# Patient Record
Sex: Female | Born: 1955
Health system: Southern US, Community
[De-identification: ages and names within clinical notes are randomized; demographics above are authoritative.]

## PROBLEM LIST (undated history)

## (undated) DIAGNOSIS — E785 Hyperlipidemia, unspecified: Secondary | ICD-10-CM

## (undated) DIAGNOSIS — I4891 Unspecified atrial fibrillation: Secondary | ICD-10-CM

## (undated) DIAGNOSIS — G4730 Sleep apnea, unspecified: Secondary | ICD-10-CM

## (undated) DIAGNOSIS — I10 Essential (primary) hypertension: Secondary | ICD-10-CM

## (undated) DIAGNOSIS — T4145XA Adverse effect of unspecified anesthetic, initial encounter: Secondary | ICD-10-CM

## (undated) DIAGNOSIS — G43909 Migraine, unspecified, not intractable, without status migrainosus: Secondary | ICD-10-CM

## (undated) DIAGNOSIS — K5792 Diverticulitis of intestine, part unspecified, without perforation or abscess without bleeding: Secondary | ICD-10-CM

## (undated) DIAGNOSIS — T8859XA Other complications of anesthesia, initial encounter: Secondary | ICD-10-CM

## (undated) DIAGNOSIS — T884XXA Failed or difficult intubation, initial encounter: Secondary | ICD-10-CM

## (undated) HISTORY — PX: GALLBLADDER SURGERY: SHX652

## (undated) HISTORY — PX: TONSILLECTOMY: SUR1361

## (undated) HISTORY — PX: ABDOMINAL HYSTERECTOMY: SHX81

## (undated) HISTORY — PX: OTHER SURGICAL HISTORY: SHX169

## (undated) HISTORY — PX: NASAL SINUS SURGERY: SHX719

## (undated) HISTORY — PX: KNEE ARTHROSCOPY: SUR90

## (undated) HISTORY — DX: Sleep apnea, unspecified: G47.30

## (undated) HISTORY — PX: SPLENIC ARTERY EMBOLIZATION: SHX2430

---

## 1997-12-29 ENCOUNTER — Emergency Department (HOSPITAL_COMMUNITY): Admission: EM | Admit: 1997-12-29 | Discharge: 1997-12-29 | Payer: Self-pay | Admitting: Emergency Medicine

## 1998-06-18 ENCOUNTER — Ambulatory Visit (HOSPITAL_BASED_OUTPATIENT_CLINIC_OR_DEPARTMENT_OTHER): Admission: RE | Admit: 1998-06-18 | Discharge: 1998-06-18 | Payer: Self-pay | Admitting: Otolaryngology

## 1998-09-04 ENCOUNTER — Ambulatory Visit (HOSPITAL_COMMUNITY): Admission: RE | Admit: 1998-09-04 | Discharge: 1998-09-04 | Payer: Self-pay | Admitting: Obstetrics and Gynecology

## 1998-09-04 ENCOUNTER — Encounter: Payer: Self-pay | Admitting: Obstetrics and Gynecology

## 1998-10-16 ENCOUNTER — Ambulatory Visit (HOSPITAL_COMMUNITY): Admission: RE | Admit: 1998-10-16 | Discharge: 1998-10-16 | Payer: Self-pay | Admitting: Otolaryngology

## 1998-10-17 ENCOUNTER — Encounter: Payer: Self-pay | Admitting: Otolaryngology

## 1998-10-17 ENCOUNTER — Ambulatory Visit (HOSPITAL_COMMUNITY): Admission: RE | Admit: 1998-10-17 | Discharge: 1998-10-17 | Payer: Self-pay | Admitting: Otolaryngology

## 1999-02-03 ENCOUNTER — Emergency Department (HOSPITAL_COMMUNITY): Admission: EM | Admit: 1999-02-03 | Discharge: 1999-02-03 | Payer: Self-pay | Admitting: *Deleted

## 1999-05-19 ENCOUNTER — Encounter: Admission: RE | Admit: 1999-05-19 | Discharge: 1999-08-17 | Payer: Self-pay | Admitting: Internal Medicine

## 1999-05-21 ENCOUNTER — Ambulatory Visit (HOSPITAL_COMMUNITY): Admission: RE | Admit: 1999-05-21 | Discharge: 1999-05-21 | Payer: Self-pay | Admitting: Otolaryngology

## 1999-05-21 ENCOUNTER — Encounter: Payer: Self-pay | Admitting: Otolaryngology

## 1999-05-21 ENCOUNTER — Encounter (INDEPENDENT_AMBULATORY_CARE_PROVIDER_SITE_OTHER): Payer: Self-pay | Admitting: Specialist

## 1999-06-11 ENCOUNTER — Ambulatory Visit (HOSPITAL_COMMUNITY): Admission: RE | Admit: 1999-06-11 | Discharge: 1999-06-11 | Payer: Self-pay | Admitting: Gastroenterology

## 1999-08-31 ENCOUNTER — Ambulatory Visit (HOSPITAL_COMMUNITY): Admission: RE | Admit: 1999-08-31 | Discharge: 1999-08-31 | Payer: Self-pay | Admitting: General Surgery

## 1999-08-31 ENCOUNTER — Encounter: Payer: Self-pay | Admitting: General Surgery

## 2000-03-03 ENCOUNTER — Encounter: Admission: RE | Admit: 2000-03-03 | Discharge: 2000-06-01 | Payer: Self-pay | Admitting: *Deleted

## 2000-03-24 ENCOUNTER — Encounter: Payer: Self-pay | Admitting: Otolaryngology

## 2000-03-24 ENCOUNTER — Ambulatory Visit (HOSPITAL_COMMUNITY): Admission: RE | Admit: 2000-03-24 | Discharge: 2000-03-24 | Payer: Self-pay | Admitting: Otolaryngology

## 2000-05-25 ENCOUNTER — Encounter (INDEPENDENT_AMBULATORY_CARE_PROVIDER_SITE_OTHER): Payer: Self-pay | Admitting: *Deleted

## 2000-05-25 ENCOUNTER — Ambulatory Visit (HOSPITAL_BASED_OUTPATIENT_CLINIC_OR_DEPARTMENT_OTHER): Admission: RE | Admit: 2000-05-25 | Discharge: 2000-05-25 | Payer: Self-pay | Admitting: Otolaryngology

## 2000-08-10 ENCOUNTER — Encounter: Admission: RE | Admit: 2000-08-10 | Discharge: 2000-08-10 | Payer: Self-pay | Admitting: Obstetrics and Gynecology

## 2000-08-10 ENCOUNTER — Encounter: Payer: Self-pay | Admitting: Obstetrics and Gynecology

## 2001-01-20 ENCOUNTER — Emergency Department (HOSPITAL_COMMUNITY): Admission: EM | Admit: 2001-01-20 | Discharge: 2001-01-20 | Payer: Self-pay | Admitting: Emergency Medicine

## 2001-02-18 ENCOUNTER — Emergency Department: Admission: EM | Admit: 2001-02-18 | Discharge: 2001-02-18 | Payer: Self-pay | Admitting: Emergency Medicine

## 2001-06-16 ENCOUNTER — Emergency Department (HOSPITAL_COMMUNITY): Admission: EM | Admit: 2001-06-16 | Discharge: 2001-06-17 | Payer: Self-pay | Admitting: Emergency Medicine

## 2001-06-17 ENCOUNTER — Encounter: Payer: Self-pay | Admitting: Emergency Medicine

## 2001-11-19 ENCOUNTER — Ambulatory Visit (HOSPITAL_COMMUNITY): Admission: RE | Admit: 2001-11-19 | Discharge: 2001-11-19 | Payer: Self-pay | Admitting: Otolaryngology

## 2001-11-19 ENCOUNTER — Encounter: Payer: Self-pay | Admitting: Otolaryngology

## 2003-02-08 ENCOUNTER — Encounter: Payer: Self-pay | Admitting: Emergency Medicine

## 2003-02-08 ENCOUNTER — Emergency Department (HOSPITAL_COMMUNITY): Admission: EM | Admit: 2003-02-08 | Discharge: 2003-02-08 | Payer: Self-pay | Admitting: Emergency Medicine

## 2003-02-12 ENCOUNTER — Encounter: Payer: Self-pay | Admitting: Orthopedic Surgery

## 2003-02-12 ENCOUNTER — Ambulatory Visit (HOSPITAL_COMMUNITY): Admission: RE | Admit: 2003-02-12 | Discharge: 2003-02-12 | Payer: Self-pay | Admitting: Orthopedic Surgery

## 2003-02-18 ENCOUNTER — Encounter: Payer: Self-pay | Admitting: Orthopedic Surgery

## 2003-02-18 ENCOUNTER — Ambulatory Visit (HOSPITAL_COMMUNITY): Admission: RE | Admit: 2003-02-18 | Discharge: 2003-02-18 | Payer: Self-pay | Admitting: Orthopedic Surgery

## 2003-02-20 ENCOUNTER — Encounter: Payer: Self-pay | Admitting: Emergency Medicine

## 2003-02-20 ENCOUNTER — Emergency Department (HOSPITAL_COMMUNITY): Admission: EM | Admit: 2003-02-20 | Discharge: 2003-02-20 | Payer: Self-pay | Admitting: Emergency Medicine

## 2003-06-19 ENCOUNTER — Other Ambulatory Visit: Admission: RE | Admit: 2003-06-19 | Discharge: 2003-06-19 | Payer: Self-pay | Admitting: Obstetrics and Gynecology

## 2003-06-20 ENCOUNTER — Ambulatory Visit (HOSPITAL_COMMUNITY): Admission: RE | Admit: 2003-06-20 | Discharge: 2003-06-20 | Payer: Self-pay | Admitting: Obstetrics and Gynecology

## 2003-11-04 ENCOUNTER — Ambulatory Visit (HOSPITAL_COMMUNITY): Admission: RE | Admit: 2003-11-04 | Discharge: 2003-11-04 | Payer: Self-pay | Admitting: Otolaryngology

## 2003-12-02 ENCOUNTER — Ambulatory Visit (HOSPITAL_COMMUNITY): Admission: RE | Admit: 2003-12-02 | Discharge: 2003-12-02 | Payer: Self-pay | Admitting: Orthopedic Surgery

## 2003-12-02 ENCOUNTER — Ambulatory Visit (HOSPITAL_COMMUNITY): Admission: RE | Admit: 2003-12-02 | Discharge: 2003-12-02 | Payer: Self-pay | Admitting: Internal Medicine

## 2003-12-03 ENCOUNTER — Emergency Department (HOSPITAL_COMMUNITY): Admission: EM | Admit: 2003-12-03 | Discharge: 2003-12-03 | Payer: Self-pay | Admitting: Emergency Medicine

## 2004-01-05 ENCOUNTER — Ambulatory Visit (HOSPITAL_COMMUNITY): Admission: RE | Admit: 2004-01-05 | Discharge: 2004-01-05 | Payer: Self-pay | Admitting: Internal Medicine

## 2004-01-07 ENCOUNTER — Inpatient Hospital Stay (HOSPITAL_COMMUNITY): Admission: AD | Admit: 2004-01-07 | Discharge: 2004-01-09 | Payer: Self-pay | Admitting: Otolaryngology

## 2004-01-07 ENCOUNTER — Ambulatory Visit (HOSPITAL_COMMUNITY): Admission: RE | Admit: 2004-01-07 | Discharge: 2004-01-07 | Payer: Self-pay | Admitting: Otolaryngology

## 2004-01-08 ENCOUNTER — Encounter (INDEPENDENT_AMBULATORY_CARE_PROVIDER_SITE_OTHER): Payer: Self-pay | Admitting: *Deleted

## 2004-01-13 ENCOUNTER — Inpatient Hospital Stay (HOSPITAL_COMMUNITY): Admission: AD | Admit: 2004-01-13 | Discharge: 2004-01-15 | Payer: Self-pay | Admitting: Otolaryngology

## 2004-01-19 ENCOUNTER — Ambulatory Visit (HOSPITAL_COMMUNITY): Admission: RE | Admit: 2004-01-19 | Discharge: 2004-01-19 | Payer: Self-pay | Admitting: Otolaryngology

## 2004-04-08 ENCOUNTER — Ambulatory Visit (HOSPITAL_COMMUNITY): Admission: RE | Admit: 2004-04-08 | Discharge: 2004-04-08 | Payer: Self-pay | Admitting: Otolaryngology

## 2004-07-02 ENCOUNTER — Ambulatory Visit (HOSPITAL_COMMUNITY): Admission: RE | Admit: 2004-07-02 | Discharge: 2004-07-02 | Payer: Self-pay | Admitting: Allergy and Immunology

## 2004-07-15 ENCOUNTER — Encounter: Admission: RE | Admit: 2004-07-15 | Discharge: 2004-07-15 | Payer: Self-pay | Admitting: Internal Medicine

## 2004-10-06 ENCOUNTER — Ambulatory Visit (HOSPITAL_COMMUNITY): Admission: RE | Admit: 2004-10-06 | Discharge: 2004-10-06 | Payer: Self-pay | Admitting: Orthopedic Surgery

## 2004-10-11 ENCOUNTER — Ambulatory Visit (HOSPITAL_COMMUNITY): Admission: RE | Admit: 2004-10-11 | Discharge: 2004-10-11 | Payer: Self-pay | Admitting: Obstetrics and Gynecology

## 2004-10-19 ENCOUNTER — Ambulatory Visit (HOSPITAL_COMMUNITY): Admission: RE | Admit: 2004-10-19 | Discharge: 2004-10-19 | Payer: Self-pay | Admitting: Orthopedic Surgery

## 2004-11-02 ENCOUNTER — Ambulatory Visit (HOSPITAL_COMMUNITY): Admission: RE | Admit: 2004-11-02 | Discharge: 2004-11-02 | Payer: Self-pay | Admitting: Orthopedic Surgery

## 2004-11-02 ENCOUNTER — Encounter (HOSPITAL_COMMUNITY): Admission: RE | Admit: 2004-11-02 | Discharge: 2004-12-02 | Payer: Self-pay | Admitting: Orthopedic Surgery

## 2004-12-07 ENCOUNTER — Encounter (HOSPITAL_COMMUNITY): Admission: RE | Admit: 2004-12-07 | Discharge: 2005-01-06 | Payer: Self-pay | Admitting: Orthopedic Surgery

## 2005-01-18 ENCOUNTER — Encounter (HOSPITAL_COMMUNITY): Admission: RE | Admit: 2005-01-18 | Discharge: 2005-02-17 | Payer: Self-pay | Admitting: Orthopedic Surgery

## 2005-04-07 ENCOUNTER — Inpatient Hospital Stay (HOSPITAL_COMMUNITY): Admission: AD | Admit: 2005-04-07 | Discharge: 2005-04-12 | Payer: Self-pay | Admitting: Internal Medicine

## 2005-07-07 ENCOUNTER — Ambulatory Visit (HOSPITAL_COMMUNITY): Admission: RE | Admit: 2005-07-07 | Discharge: 2005-07-08 | Payer: Self-pay | Admitting: Otolaryngology

## 2005-07-07 ENCOUNTER — Encounter (INDEPENDENT_AMBULATORY_CARE_PROVIDER_SITE_OTHER): Payer: Self-pay | Admitting: Specialist

## 2005-10-12 ENCOUNTER — Encounter: Payer: Self-pay | Admitting: *Deleted

## 2005-10-13 ENCOUNTER — Ambulatory Visit (HOSPITAL_COMMUNITY): Admission: RE | Admit: 2005-10-13 | Discharge: 2005-10-13 | Payer: Self-pay | Admitting: Gastroenterology

## 2005-11-26 ENCOUNTER — Emergency Department (HOSPITAL_COMMUNITY): Admission: EM | Admit: 2005-11-26 | Discharge: 2005-11-26 | Payer: Self-pay | Admitting: Emergency Medicine

## 2006-01-27 ENCOUNTER — Ambulatory Visit (HOSPITAL_COMMUNITY): Admission: RE | Admit: 2006-01-27 | Discharge: 2006-01-27 | Payer: Self-pay | Admitting: Internal Medicine

## 2006-02-08 ENCOUNTER — Inpatient Hospital Stay (HOSPITAL_COMMUNITY): Admission: EM | Admit: 2006-02-08 | Discharge: 2006-02-11 | Payer: Self-pay | Admitting: Emergency Medicine

## 2006-05-22 ENCOUNTER — Ambulatory Visit (HOSPITAL_COMMUNITY): Admission: RE | Admit: 2006-05-22 | Discharge: 2006-05-22 | Payer: Self-pay | Admitting: Otolaryngology

## 2006-08-10 ENCOUNTER — Ambulatory Visit (HOSPITAL_COMMUNITY): Admission: RE | Admit: 2006-08-10 | Discharge: 2006-08-10 | Payer: Self-pay | Admitting: Obstetrics and Gynecology

## 2007-11-30 ENCOUNTER — Ambulatory Visit (HOSPITAL_COMMUNITY): Admission: RE | Admit: 2007-11-30 | Discharge: 2007-11-30 | Payer: Self-pay | Admitting: Obstetrics and Gynecology

## 2008-05-29 ENCOUNTER — Ambulatory Visit (HOSPITAL_COMMUNITY): Admission: RE | Admit: 2008-05-29 | Discharge: 2008-05-29 | Payer: Self-pay | Admitting: Otolaryngology

## 2008-06-17 ENCOUNTER — Encounter (INDEPENDENT_AMBULATORY_CARE_PROVIDER_SITE_OTHER): Payer: Self-pay | Admitting: Interventional Radiology

## 2008-06-17 ENCOUNTER — Ambulatory Visit (HOSPITAL_COMMUNITY): Admission: RE | Admit: 2008-06-17 | Discharge: 2008-06-17 | Payer: Self-pay | Admitting: Otolaryngology

## 2008-08-23 ENCOUNTER — Emergency Department (HOSPITAL_COMMUNITY): Admission: EM | Admit: 2008-08-23 | Discharge: 2008-08-24 | Payer: Self-pay | Admitting: Emergency Medicine

## 2008-08-25 ENCOUNTER — Inpatient Hospital Stay (HOSPITAL_COMMUNITY): Admission: AD | Admit: 2008-08-25 | Discharge: 2008-08-29 | Payer: Self-pay | Admitting: Internal Medicine

## 2008-08-28 ENCOUNTER — Encounter (INDEPENDENT_AMBULATORY_CARE_PROVIDER_SITE_OTHER): Payer: Self-pay | Admitting: Internal Medicine

## 2008-09-01 ENCOUNTER — Inpatient Hospital Stay (HOSPITAL_COMMUNITY): Admission: EM | Admit: 2008-09-01 | Discharge: 2008-09-04 | Payer: Self-pay | Admitting: Emergency Medicine

## 2008-11-21 ENCOUNTER — Ambulatory Visit (HOSPITAL_COMMUNITY): Admission: RE | Admit: 2008-11-21 | Discharge: 2008-11-21 | Payer: Self-pay | Admitting: Otolaryngology

## 2008-12-30 ENCOUNTER — Ambulatory Visit (HOSPITAL_COMMUNITY): Admission: RE | Admit: 2008-12-30 | Discharge: 2008-12-30 | Payer: Self-pay | Admitting: Internal Medicine

## 2009-02-11 ENCOUNTER — Encounter (INDEPENDENT_AMBULATORY_CARE_PROVIDER_SITE_OTHER): Payer: Self-pay | Admitting: Interventional Radiology

## 2009-02-11 ENCOUNTER — Ambulatory Visit (HOSPITAL_COMMUNITY): Admission: RE | Admit: 2009-02-11 | Discharge: 2009-02-11 | Payer: Self-pay | Admitting: Otolaryngology

## 2009-02-18 ENCOUNTER — Ambulatory Visit (HOSPITAL_COMMUNITY): Admission: RE | Admit: 2009-02-18 | Discharge: 2009-02-18 | Payer: Self-pay | Admitting: Obstetrics and Gynecology

## 2009-07-19 ENCOUNTER — Observation Stay (HOSPITAL_COMMUNITY): Admission: EM | Admit: 2009-07-19 | Discharge: 2009-07-20 | Payer: Self-pay | Admitting: Emergency Medicine

## 2009-07-19 ENCOUNTER — Ambulatory Visit: Payer: Self-pay | Admitting: Internal Medicine

## 2009-07-20 ENCOUNTER — Telehealth (INDEPENDENT_AMBULATORY_CARE_PROVIDER_SITE_OTHER): Payer: Self-pay

## 2009-07-21 ENCOUNTER — Encounter (HOSPITAL_COMMUNITY): Admission: RE | Admit: 2009-07-21 | Discharge: 2009-09-16 | Payer: Self-pay | Admitting: Cardiology

## 2009-07-21 ENCOUNTER — Ambulatory Visit: Payer: Self-pay

## 2009-07-21 ENCOUNTER — Ambulatory Visit: Payer: Self-pay | Admitting: Cardiology

## 2009-07-29 ENCOUNTER — Encounter: Payer: Self-pay | Admitting: Internal Medicine

## 2009-07-30 DIAGNOSIS — E041 Nontoxic single thyroid nodule: Secondary | ICD-10-CM | POA: Insufficient documentation

## 2009-07-30 DIAGNOSIS — E119 Type 2 diabetes mellitus without complications: Secondary | ICD-10-CM | POA: Insufficient documentation

## 2009-07-30 DIAGNOSIS — K589 Irritable bowel syndrome without diarrhea: Secondary | ICD-10-CM | POA: Insufficient documentation

## 2009-07-30 DIAGNOSIS — E785 Hyperlipidemia, unspecified: Secondary | ICD-10-CM | POA: Insufficient documentation

## 2009-07-30 DIAGNOSIS — K219 Gastro-esophageal reflux disease without esophagitis: Secondary | ICD-10-CM | POA: Insufficient documentation

## 2009-07-30 DIAGNOSIS — G4733 Obstructive sleep apnea (adult) (pediatric): Secondary | ICD-10-CM | POA: Insufficient documentation

## 2009-07-30 DIAGNOSIS — I10 Essential (primary) hypertension: Secondary | ICD-10-CM | POA: Insufficient documentation

## 2009-08-04 ENCOUNTER — Ambulatory Visit: Payer: Self-pay | Admitting: Cardiology

## 2009-08-04 DIAGNOSIS — R079 Chest pain, unspecified: Secondary | ICD-10-CM | POA: Insufficient documentation

## 2010-04-19 ENCOUNTER — Ambulatory Visit (HOSPITAL_COMMUNITY)
Admission: RE | Admit: 2010-04-19 | Discharge: 2010-04-19 | Payer: Self-pay | Source: Home / Self Care | Admitting: Obstetrics and Gynecology

## 2010-07-03 ENCOUNTER — Encounter: Payer: Self-pay | Admitting: Obstetrics and Gynecology

## 2010-07-04 ENCOUNTER — Encounter: Payer: Self-pay | Admitting: Orthopedic Surgery

## 2010-07-15 NOTE — Assessment & Plan Note (Signed)
Summary: Cardiology Nuclear Study  Nuclear Med Background Indications for Stress Test: Evaluation for Ischemia, Post Hospital  Indications Comments: 07/19/09 [facility]:chest pain, left arm pain.  MI r/o.  History: Asthma, Echo, GXT  History Comments: '08 GXT:(-); 3/10 Echo: EF=65-70%; h/o OSA  Symptoms: Chest Pressure, Fatigue, Fatigue with Exertion, Palpitations  Symptoms Comments: CP>(L)neck and arm. Last episode of ZH:YQMVHQION.   Nuclear Pre-Procedure Cardiac Risk Factors: Hypertension, Lipids, NIDDM Caffeine/Decaff Intake: None NPO After: 7:30 AM Lungs: Clear IV 0.9% NS with Angio Cath: 22g     IV Site: (R) Wrist IV Started by: Irean Hong RN Chest Size (in) 38     Cup Size D     Height (in): 68.5 Weight (lb): 201 BMI: 30.23  Nuclear Med Study 1 or 2 day study:  1 day     Stress Test Type:  Stress Reading MD:  Willa Rough, MD     Referring MD:  Valera Castle, MD Resting Radionuclide:  Technetium 39m Tetrofosmin     Resting Radionuclide Dose:  11.0 mCi  Stress Radionuclide:  Technetium 6m Tetrofosmin     Stress Radionuclide Dose:  32.0 mCi   Stress Protocol Exercise Time (min):  7:00 min     Max HR:  151 bpm     Predicted Max HR:  165 bpm  Max Systolic BP: 130 mm Hg     Percent Max HR:  91.52 %     METS: 8.5 Rate Pressure Product:  62952    Stress Test Technologist:  Rea College CMA-N     Nuclear Technologist:  Harlow Asa CNMT  Rest Procedure  Myocardial perfusion imaging was performed at rest 45 minutes following the intravenous administration of Myoview Technetium 98m Tetrofosmin.  Stress Procedure  The patient exercised for seven minutes.  The patient stopped due to fatigue and denied any chest pain.  There were no significant ST-T wave changes. Myoview was injected at peak exercise and myocardial perfusion imaging was performed after a brief delay.  QPS Raw Data Images:  Normal; no motion artifact; normal heart/lung ratio. Stress Images:  There is normal  uptake in all areas. Rest Images:  Normal homogeneous uptake in all areas of the myocardium. Subtraction (SDS):  No evidence of ischemia. Transient Ischemic Dilatation:  .95  (Normal <1.22)  Lung/Heart Ratio:  .32  (Normal <0.45)  Quantitative Gated Spect Images QGS EDV:  67 ml QGS ESV:  16 ml QGS EF:  76 % QGS cine images:  Normal motion  Findings Normal nuclear study      Overall Impression  Exercise Capacity: Fair exercise capacity. BP Response: Normal blood pressure response. Clinical Symptoms: No chest pain ECG Impression: No significant ST segment change suggestive of ischemia. Overall Impression: Normal stress nuclear study.  Appended Document: Cardiology Nuclear Study ok.

## 2010-07-15 NOTE — Progress Notes (Signed)
Summary: Nuc. Pre-Procedure  Phone Note Outgoing Call Call back at [facility] Phone [phone]   Call placed by: Irean Hong, RN,  July 20, 2009 3:10 PM Summary of Call: Left message with information on Myoview Information Sheet (see scanned document for details).      Nuclear Med Background Indications for Stress Test: Evaluation for Ischemia, Post Hospital  Indications Comments: 07/19/09 [facility]: CP,Left arm pain.  History: Echo, GXT  History Comments: '08 GXT:(-). 3/10 Echo: EF=65-70%.  Symptoms: Chest Pressure  Symptoms Comments: Radiates (L) Neck and arm.   Nuclear Pre-Procedure Cardiac Risk Factors: Hypertension, Lipids, NIDDM

## 2010-07-15 NOTE — Assessment & Plan Note (Signed)
Summary: eph/ gd   Visit Type:  EPH Primary Provider:  Dr. Felipa Eth  CC:  no cardiac complaints today...here for Nuc results.  History of Present Illness: Mrs [name] returns today for further evaluation and management of chest discomfort.  She was admitted overnight and ruled out by cardiac markers.  With her history of several cardiac risk factors, we performed a stress Myoview as an outpatient. Her ejection fraction 76% with good exercise tolerance. There were no Alberto Pina motion abnormalities and no ischemia noted.  She's had no further discomfort.  Current Medications (verified): 1)  Amlodipine Besylate 5 Mg Tabs (Amlodipine Besylate) .Marland Kitchen.. 1 Tab Once Daily 2)  Effexor Xr 150 Mg Xr24h-Cap (Venlafaxine Hcl) .Marland Kitchen.. 1 Cap Once Daily 3)  Furosemide 40 Mg Tabs (Furosemide) .Marland Kitchen.. 1 Tab Once Daily 4)  Pravastatin Sodium 20 Mg Tabs (Pravastatin Sodium) .Marland Kitchen.. 1 Tab At Bedtime 5)  Quinapril Hcl 40 Mg Tabs (Quinapril Hcl) .Marland Kitchen.. 1 Tab Once Daily 6)  Tylenol Extra Strength 500 Mg Tabs (Acetaminophen) .Marland Kitchen.. 1 Tab Every 8 Hours As Needed  Allergies: 1)  ! Ibuprofen 2)  ! Aspirin 3)  ! Penicillin 4)  ! Sulfa 5)  ! Codeine 6)  ! Demerol 7)  ! Cephalosporins 8)  ! * Ct Dye  Past History:  Past Medical History: Last updated: 07/30/2009 HYPERTENSION (ICD-401.9) HYPERLIPIDEMIA (ICD-272.4) GERD (ICD-530.81) THYROID NODULE, LEFT (ICD-241.0) DIABETES MELLITUS, TYPE II (ICD-250.00) IRRITABLE BOWEL SYNDROME (ICD-564.1) SLEEP APNEA, OBSTRUCTIVE (ICD-327.23) BASAL CELL CARCINOMA, HX OF/ FOREHEAD (ICD-V10.83)  Past Surgical History: Last updated: 07/30/2009  1. Laparoscopic cholecystectomy.   2. Tonsillectomy.   3. Splenectomy.   4. Total abdominal hysterectomy  Family History: Last updated: 07/30/2009  Negative for premature coronary artery disease.      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  She denies any tobacco,  alcohol or drug use.      Review of Systems       negative other than history of present illness  Vital Signs:  Patient profile:   55 year old female Height:      68.5 inches Weight:      206 pounds BMI:     30.98 Pulse rate:   80 / minute Pulse rhythm:   regular BP sitting:   128 / 80  (left arm) Cuff size:   large  Vitals Entered By: Danielle Rankin, CMA (August 04, 2009 12:11 PM)  Physical Exam  General:  Well developed, well nourished, in no acute distress. Head:  normocephalic and atraumatic Eyes:  PERRLA/EOM intact; conjunctiva and lids normal. Neck:  Neck supple, no JVD. No masses, thyromegaly or abnormal cervical nodes. Chest Marirose Deveney:  no deformities or breast masses noted Lungs:  Clear bilaterally to auscultation and percussion. Heart:  Non-displaced PMI, chest non-tender; regular rate and rhythm, S1, S2 without murmurs, rubs or gallops. Carotid upstroke normal, no bruit. Normal abdominal aortic size, no bruits. Femorals normal pulses, no bruits. Pedals normal pulses. No edema, no varicosities. Msk:  Back normal, normal gait. Muscle strength and tone normal. Pulses:  pulses normal in all 4 extremities Extremities:  No clubbing or cyanosis. Neurologic:  Alert and oriented x 3. Skin:  Intact without lesions or rashes. Psych:  Normal affect.   Problems:  Medical Problems Added: 1)  Dx of Chest Pain-unspecified  (ICD-786.50)  Impression & Recommendations:  Problem # 1:  CHEST PAIN-UNSPECIFIED (ICD-786.50) Assessment Improved She is  having no further discomfort.  Stress Myoview reviewed. At this point in time, no further cardiac evaluation indicated. Symptoms of angina and acute coronary syndrome reviewed once again. I'll see her back p.r.n. Her updated medication list for this problem includes:    Amlodipine Besylate 5 Mg Tabs (Amlodipine besylate) .Marland Kitchen... 1 tab once daily    Quinapril Hcl 40 Mg Tabs (Quinapril hcl) .Marland Kitchen... 1 tab once daily  Patient Instructions: 1)   Your physician recommends that you schedule a follow-up appointment in: AS NEEDED 2)  Your physician recommends that you continue on your current medications as directed. Please refer to the Current Medication list given to you today.

## 2010-08-26 ENCOUNTER — Other Ambulatory Visit: Payer: Self-pay | Admitting: Internal Medicine

## 2010-08-26 DIAGNOSIS — R51 Headache: Secondary | ICD-10-CM

## 2010-08-27 ENCOUNTER — Ambulatory Visit
Admission: RE | Admit: 2010-08-27 | Discharge: 2010-08-27 | Disposition: A | Discharge status: Home / Self Care | Payer: 59 | Source: Ambulatory Visit | Attending: Internal Medicine | Admitting: Internal Medicine

## 2010-08-27 DIAGNOSIS — R51 Headache: Secondary | ICD-10-CM

## 2010-09-01 LAB — CK TOTAL AND CKMB (NOT AT ARMC)
CK, MB: 1.1 ng/mL (ref 0.3–4.0)
Relative Index: INVALID (ref 0.0–2.5)

## 2010-09-01 LAB — DIFFERENTIAL
Basophils Absolute: 0.1 10*3/uL (ref 0.0–0.1)
Eosinophils Absolute: 0.2 10*3/uL (ref 0.0–0.7)
Lymphs Abs: 6.8 10*3/uL — ABNORMAL HIGH (ref 0.7–4.0)
Monocytes Absolute: 1.3 10*3/uL — ABNORMAL HIGH (ref 0.1–1.0)

## 2010-09-01 LAB — POCT CARDIAC MARKERS: Troponin i, poc: <0.05 ng/mL (ref 0.00–0.09)

## 2010-09-01 LAB — CBC
MCHC: 33.9 g/dL (ref 30.0–36.0)
MCV: 96 fL (ref 78.0–100.0)
Platelets: 313 10*3/uL (ref 150–400)
RDW: 13 % (ref 11.5–15.5)

## 2010-09-01 LAB — LIPID PANEL
LDL Cholesterol: 93 mg/dL (ref 0–99)
VLDL: 34 mg/dL (ref 0–40)

## 2010-09-01 LAB — CARDIAC PANEL(CRET KIN+CKTOT+MB+TROPI)
CK, MB: 1 ng/mL (ref 0.3–4.0)
Troponin I: 0.01 ng/mL (ref 0.00–0.06)

## 2010-09-01 LAB — BASIC METABOLIC PANEL
Creatinine, Ser: 0.93 mg/dL (ref 0.4–1.2)
GFR calc Af Amer: >60 mL/min (ref >60)
GFR calc non Af Amer: >60 mL/min (ref >60)
Sodium: 141 mEq/L (ref 135–145)

## 2010-09-01 LAB — PATHOLOGIST SMEAR REVIEW

## 2010-09-01 LAB — HEMOGLOBIN A1C: Hgb A1c MFr Bld: 6 % (ref 4.6–6.1)

## 2010-09-01 LAB — TROPONIN I: Troponin I: 0.01 ng/mL (ref 0.00–0.06)

## 2010-09-01 LAB — GLUCOSE, CAPILLARY: Glucose-Capillary: 91 mg/dL (ref 70–99)

## 2010-09-23 LAB — GLUCOSE, CAPILLARY
Glucose-Capillary: 106 mg/dL — ABNORMAL HIGH (ref 70–99)
Glucose-Capillary: 109 mg/dL — ABNORMAL HIGH (ref 70–99)
Glucose-Capillary: 118 mg/dL — ABNORMAL HIGH (ref 70–99)
Glucose-Capillary: 122 mg/dL — ABNORMAL HIGH (ref 70–99)
Glucose-Capillary: 125 mg/dL — ABNORMAL HIGH (ref 70–99)
Glucose-Capillary: 131 mg/dL — ABNORMAL HIGH (ref 70–99)
Glucose-Capillary: 132 mg/dL — ABNORMAL HIGH (ref 70–99)
Glucose-Capillary: 132 mg/dL — ABNORMAL HIGH (ref 70–99)
Glucose-Capillary: 135 mg/dL — ABNORMAL HIGH (ref 70–99)
Glucose-Capillary: 148 mg/dL — ABNORMAL HIGH (ref 70–99)
Glucose-Capillary: 168 mg/dL — ABNORMAL HIGH (ref 70–99)
Glucose-Capillary: 169 mg/dL — ABNORMAL HIGH (ref 70–99)
Glucose-Capillary: 170 mg/dL — ABNORMAL HIGH (ref 70–99)
Glucose-Capillary: 183 mg/dL — ABNORMAL HIGH (ref 70–99)
Glucose-Capillary: 243 mg/dL — ABNORMAL HIGH (ref 70–99)

## 2010-09-23 LAB — COMPREHENSIVE METABOLIC PANEL
ALT: 42 U/L — ABNORMAL HIGH (ref 0–35)
ALT: 74 U/L — ABNORMAL HIGH (ref 0–35)
ALT: 83 U/L — ABNORMAL HIGH (ref 0–35)
ALT: 65 U/L — ABNORMAL HIGH (ref 0–35)
AST: 27 U/L (ref 0–37)
AST: 29 U/L (ref 0–37)
AST: 31 U/L (ref 0–37)
AST: 40 U/L — ABNORMAL HIGH (ref 0–37)
AST: 45 U/L — ABNORMAL HIGH (ref 0–37)
AST: 52 U/L — ABNORMAL HIGH (ref 0–37)
Albumin: 2.8 g/dL — ABNORMAL LOW (ref 3.5–5.2)
Albumin: 2.9 g/dL — ABNORMAL LOW (ref 3.5–5.2)
Albumin: 2.9 g/dL — ABNORMAL LOW (ref 3.5–5.2)
Albumin: 3.2 g/dL — ABNORMAL LOW (ref 3.5–5.2)
Albumin: 3 g/dL — ABNORMAL LOW (ref 3.5–5.2)
Albumin: 3.5 g/dL (ref 3.5–5.2)
Alkaline Phosphatase: 122 U/L — ABNORMAL HIGH (ref 39–117)
Alkaline Phosphatase: 131 U/L — ABNORMAL HIGH (ref 39–117)
Alkaline Phosphatase: 139 U/L — ABNORMAL HIGH (ref 39–117)
Alkaline Phosphatase: 144 U/L — ABNORMAL HIGH (ref 39–117)
Alkaline Phosphatase: 144 U/L — ABNORMAL HIGH (ref 39–117)
Alkaline Phosphatase: 121 U/L — ABNORMAL HIGH (ref 39–117)
BUN: 9 mg/dL (ref 6–23)
BUN: 13 mg/dL (ref 6–23)
CO2: 28 mEq/L (ref 19–32)
CO2: 28 mEq/L (ref 19–32)
CO2: 26 mEq/L (ref 19–32)
Calcium: 8.5 mg/dL (ref 8.4–10.5)
Calcium: 8.7 mg/dL (ref 8.4–10.5)
Calcium: 8.8 mg/dL (ref 8.4–10.5)
Calcium: 9.2 mg/dL (ref 8.4–10.5)
Calcium: 8.8 mg/dL (ref 8.4–10.5)
Chloride: 104 mEq/L (ref 96–112)
Chloride: 104 mEq/L (ref 96–112)
Chloride: 104 mEq/L (ref 96–112)
Chloride: 107 mEq/L (ref 96–112)
Creatinine, Ser: 0.9 mg/dL (ref 0.4–1.2)
Creatinine, Ser: 0.93 mg/dL (ref 0.4–1.2)
Creatinine, Ser: 1.06 mg/dL (ref 0.4–1.2)
GFR calc Af Amer: >60 mL/min (ref >60)
GFR calc Af Amer: >60 mL/min (ref >60)
GFR calc Af Amer: >60 mL/min (ref >60)
GFR calc Af Amer: >60 mL/min (ref >60)
GFR calc Af Amer: >60 mL/min (ref >60)
GFR calc Af Amer: >60 mL/min (ref >60)
GFR calc Af Amer: >60 mL/min (ref >60)
GFR calc Af Amer: >60 mL/min (ref >60)
GFR calc non Af Amer: >60 mL/min (ref >60)
GFR calc non Af Amer: >60 mL/min (ref >60)
GFR calc non Af Amer: >60 mL/min (ref >60)
GFR calc non Af Amer: >60 mL/min (ref >60)
Glucose, Bld: 108 mg/dL — ABNORMAL HIGH (ref 70–99)
Glucose, Bld: 100 mg/dL — ABNORMAL HIGH (ref 70–99)
Potassium: 3.4 mEq/L — ABNORMAL LOW (ref 3.5–5.1)
Potassium: 3.6 mEq/L (ref 3.5–5.1)
Potassium: 3.6 mEq/L (ref 3.5–5.1)
Potassium: 3.9 mEq/L (ref 3.5–5.1)
Potassium: 3.5 mEq/L (ref 3.5–5.1)
Sodium: 139 mEq/L (ref 135–145)
Sodium: 141 mEq/L (ref 135–145)
Sodium: 144 mEq/L (ref 135–145)
Sodium: 135 mEq/L (ref 135–145)
Sodium: 136 mEq/L (ref 135–145)
Total Bilirubin: 0.3 mg/dL (ref 0.3–1.2)
Total Bilirubin: 0.6 mg/dL (ref 0.3–1.2)
Total Bilirubin: 0.6 mg/dL (ref 0.3–1.2)
Total Bilirubin: 0.8 mg/dL (ref 0.3–1.2)
Total Protein: 6.4 g/dL (ref 6.0–8.3)
Total Protein: 6.7 g/dL (ref 6.0–8.3)
Total Protein: 7.3 g/dL (ref 6.0–8.3)

## 2010-09-23 LAB — CBC
HCT: 32.7 % — ABNORMAL LOW (ref 36.0–46.0)
HCT: 34.7 % — ABNORMAL LOW (ref 36.0–46.0)
HCT: 35.8 % — ABNORMAL LOW (ref 36.0–46.0)
HCT: 36.1 % (ref 36.0–46.0)
Hemoglobin: 11.2 g/dL — ABNORMAL LOW (ref 12.0–15.0)
Hemoglobin: 11.9 g/dL — ABNORMAL LOW (ref 12.0–15.0)
Hemoglobin: 12 g/dL (ref 12.0–15.0)
Hemoglobin: 12.3 g/dL (ref 12.0–15.0)
Hemoglobin: 13 g/dL (ref 12.0–15.0)
MCHC: 33.8 g/dL (ref 30.0–36.0)
MCHC: 34.2 g/dL (ref 30.0–36.0)
MCHC: 34.4 g/dL (ref 30.0–36.0)
MCHC: 34.6 g/dL (ref 30.0–36.0)
MCHC: 35.3 g/dL (ref 30.0–36.0)
MCHC: 33.8 g/dL (ref 30.0–36.0)
MCHC: 33.8 g/dL (ref 30.0–36.0)
MCV: 94.2 fL (ref 78.0–100.0)
MCV: 94.4 fL (ref 78.0–100.0)
MCV: 98.5 fL (ref 78.0–100.0)
Platelets: 459 10*3/uL — ABNORMAL HIGH (ref 150–400)
Platelets: 488 10*3/uL — ABNORMAL HIGH (ref 150–400)
Platelets: 519 10*3/uL — ABNORMAL HIGH (ref 150–400)
Platelets: 557 10*3/uL — ABNORMAL HIGH (ref 150–400)
Platelets: 311 10*3/uL (ref 150–400)
RBC: 3.35 MIL/uL — ABNORMAL LOW (ref 3.87–5.11)
RBC: 3.78 MIL/uL — ABNORMAL LOW (ref 3.87–5.11)
RBC: 3.8 MIL/uL — ABNORMAL LOW (ref 3.87–5.11)
RBC: 3.84 MIL/uL — ABNORMAL LOW (ref 3.87–5.11)
RBC: 3.88 MIL/uL (ref 3.87–5.11)
RBC: 4.09 MIL/uL (ref 3.87–5.11)
RDW: 13.3 % (ref 11.5–15.5)
RDW: 13.4 % (ref 11.5–15.5)
RDW: 13.6 % (ref 11.5–15.5)
RDW: 13.6 % (ref 11.5–15.5)
RDW: 13.8 % (ref 11.5–15.5)
RDW: 13.1 % (ref 11.5–15.5)
WBC: 12.1 10*3/uL — ABNORMAL HIGH (ref 4.0–10.5)
WBC: 12.9 10*3/uL — ABNORMAL HIGH (ref 4.0–10.5)
WBC: 17.1 10*3/uL — ABNORMAL HIGH (ref 4.0–10.5)
WBC: 18.4 10*3/uL — ABNORMAL HIGH (ref 4.0–10.5)

## 2010-09-23 LAB — URINALYSIS, ROUTINE W REFLEX MICROSCOPIC
Glucose, UA: NEGATIVE mg/dL
Ketones, ur: NEGATIVE mg/dL
Leukocytes, UA: NEGATIVE
Specific Gravity, Urine: <1.005 — ABNORMAL LOW (ref 1.005–1.030)
pH: 6.5 (ref 5.0–8.0)

## 2010-09-23 LAB — CARDIAC PANEL(CRET KIN+CKTOT+MB+TROPI)
CK, MB: 0.8 ng/mL (ref 0.3–4.0)
Total CK: 41 U/L (ref 7–177)

## 2010-09-23 LAB — DIFFERENTIAL
Band Neutrophils: 8 % (ref 0–10)
Basophils Absolute: 0 10*3/uL (ref 0.0–0.1)
Basophils Relative: 0 % (ref 0–1)
Eosinophils Absolute: 0 10*3/uL (ref 0.0–0.7)
Eosinophils Relative: 2 % (ref 0–5)
Eosinophils Relative: 0 % (ref 0–5)
Lymphocytes Relative: 7 % — ABNORMAL LOW (ref 12–46)
Lymphocytes Relative: 16 % (ref 12–46)
Lymphs Abs: 1.2 10*3/uL (ref 0.7–4.0)
Lymphs Abs: 3.7 10*3/uL (ref 0.7–4.0)
Monocytes Absolute: 1.3 10*3/uL — ABNORMAL HIGH (ref 0.1–1.0)
Myelocytes: 0 %
Promyelocytes Absolute: 0 %

## 2010-09-23 LAB — BASIC METABOLIC PANEL
CO2: 25 mEq/L (ref 19–32)
GFR calc Af Amer: >60 mL/min (ref >60)
GFR calc non Af Amer: >60 mL/min (ref >60)
Glucose, Bld: 86 mg/dL (ref 70–99)
Potassium: 3.5 mEq/L (ref 3.5–5.1)
Sodium: 139 mEq/L (ref 135–145)

## 2010-09-23 LAB — CULTURE, BLOOD (ROUTINE X 2): Culture: NO GROWTH

## 2010-09-23 LAB — URINE MICROSCOPIC-ADD ON

## 2010-09-23 LAB — LIPASE, BLOOD: Lipase: 34 U/L (ref 11–59)

## 2010-09-23 LAB — POCT CARDIAC MARKERS
CKMB, poc: <1 ng/mL — ABNORMAL LOW (ref 1.0–8.0)
CKMB, poc: <1 ng/mL — ABNORMAL LOW (ref 1.0–8.0)
Troponin i, poc: <0.05 ng/mL (ref 0.00–0.09)

## 2010-10-26 NOTE — Discharge Summary (Signed)
[name], [name]              ACCOUNT NO.:  [id number]   MEDICAL RECORD NO.:  [id number]          PATIENT TYPE:  INP   LOCATION:  2012                         FACILITY:  [facility]   PHYSICIAN:  Larina Earthly, M.D.        DATE OF BIRTH:  [birth year]   DATE OF ADMISSION:  08/31/2008  DATE OF DISCHARGE:  09/04/2008                               DISCHARGE SUMMARY   DISCHARGE DIAGNOSES:  1. Bilateral pneumonia refractory to outpatient management during the      second hospitalization for the same and complicated by pleuritic      chest discomfort with workup for pulmonary embolism and cardiac      etiologies, unremarkable.  2. Atypical chest discomfort, possibly related to bilateral community-      acquired pneumonia in the setting of remote splenectomy again with      CT angiogram negative for any evidence of pulmonary embolism and      now gradually resolving with pain management in the oral form.  3. Type 2 diabetes, stable with sliding-scale insulin.    Dictation ended at this point.      Larina Earthly, M.D.  Electronically Signed     RA/MEDQ  D:  09/04/2008  T:  09/05/2008  Job:  [id number]

## 2010-10-26 NOTE — H&P (Signed)
[name], [name]              ACCOUNT NO.:  [id number]   MEDICAL RECORD NO.:  [id number]          PATIENT TYPE:  INP   LOCATION:  3023                         FACILITY:  [facility]   PHYSICIAN:  Larina Earthly, M.D.        DATE OF BIRTH:  [birth year]   DATE OF ADMISSION:  08/25/2008  DATE OF DISCHARGE:                              HISTORY & PHYSICAL   CHIEF COMPLAINT:  Pneumonia and nausea and vomiting in the setting of  being immunocompromised, status post splenectomy.   HISTORY OF PRESENT ILLNESS:  This is a 55 year old Caucasian female who  has a past medical history for diet-controlled diabetes, hypertension,  hyperlipidemia, obstructive sleep apnea on CPAP, gastroesophageal reflux  disease, and history of splenectomy in 1993 and has subsequently had a  history of pneumonia in 2007 presents with a 1-week history of malaise,  fever, myalgias, nasal and chest congestion to [facility] Emergency Room  on August 23, 2008.  There, her white blood cell count was 26,000 and a  CT of the abdomen and pelvis revealed an extensive left lower lobe  pneumonia.  The patient was discharged on Avelox x10 days to home with  her husband, but her subsequent course was complicated by significant  anorexia, nausea, vomiting, and generalized weakness.  She is  subsequently presented to my office for further evaluation and  management and given her history of splenectomy, significant generalized  weakness, continued nausea and vomiting, and inability to keep fluids  within her system, she was subsequently admitted for IV antibiotics, IV  antiemetics, and further management and observation given her  immunocompromised situation.   REVIEW OF SYSTEMS:  Positive for fevers, chills, cough productive,  malaise, nausea, vomiting, and some abdominal pain of unclear etiology  prompting the initial CT of the abdomen and pelvis at the emergency  room.  She denies any focal neurological or musculoskeletal deficits.   PROBLEM LIST:  1. Hypertension.  2. Type 2 diabetes mellitus with a hemoglobin A1c of 6.0%      approximately 2 months ago, on diet and exercise program.  3. Hyperlipidemia.  4. Asthma.  5. Obstructive sleep apnea with a history of CPAP usage.  6. Gastroesophageal reflux disease with hiatal hernia.  7. Edema consistent with venous insufficiency changes.  8. History of irritable bowel syndrome.  9. History of minimal depression.  10.History of anaphylaxis to nonsteroidal anti-inflammatory agents.  11.History of pneumonia in February 2007.   PAST SURGICAL HISTORY:  She has a history of a cholecystectomy,  tonsillectomy, splenectomy, and total abdominal hysterectomy.   SOCIAL HISTORY:  The patient is married x24 years, is in the  administration at [employer] and denies any tobacco or  ethanol use.   FAMILY HISTORY:  Significant for hypertension, diabetes, Paget disease,  colon polyps, obstructive sleep apnea, and colon cancer.   ALLERGIES:  The patient has allergies to ASPIRIN, CEPHALOSPORINS,  NONSTEROIDAL ANTI-INFLAMMATORY AGENTS, DEMEROL, CODEINE, and Sulfa.   CURRENT MEDICATIONS:  1. Accupril 40 mg each day.  2. EpiPen.  3. Advair 50/500 one puff b.i.d.  4. Albuterol metered-dose inhaler  p.r.n.  5. Norvasc 5 mg daily.  6. Zocor 40 mg daily.  7. Effexor XR 150 mg p.o. q.a.m.  8. Nexium p.r.n.  9. Fish oil.  10.Has recently been initiated on Avelox and hydrocodone as well as      Aldara for a skin cancer on her right cheek.   PHYSICAL EXAMINATION:  GENERAL:  We have a female, alert, oriented x3,  but lying down in general and significant malaise.  VITAL SIGNS:  Weight 218 pounds, blood pressure 100/54, pulse 100-115,  oxygen saturation 92% on room air, temperature 97.8 degrees Fahrenheit.  HEENT:  Sclerae anicteric.  Extraocular movements are intact.  There are  no oropharyngeal lesions.  Face is symmetric.  NECK:  There is no cervical lymphadenopathy.   No thyromegaly.  LUNGS:  Coarse breath sounds bilaterally with a few rales at the left  base.  CARDIOVASCULAR:  Regular rate and rhythm.  ABDOMEN:  Soft, nontender, and nondistended abdomen.  Bowel sounds are  present.  EXTREMITIES:  Trace edema is present in lower extremities.  NEUROLOGIC:  Grossly nonfocal.  The patient can move all four  extremities.   ASSESSMENT AND PLAN:  1. Left lower lobe pneumonia associated with anorexia, nausea, and      vomiting.  We will obtain blood cultures x2.  Continue Avelox and      add azithromycin for broad-spectrum coverage.  We will add      vancomycin if symptoms worsen or cultures were positive to indicate      the appropriateness of that antibiotic choice.  We will check chest      x-ray and provide IV fluids.  We will also recheck white blood cell      counts.  2. Nausea and vomiting.  We will check electrolytes and liver      parameters and we will add Zofran as antiemetic.  3. Pain management.  We will provide morphine and Vicodin p.r.n.  4. Type 2 diabetes, certainly diet controlled prior to      hospitalization.  We will add a sliding scale insulin regimen.  5. Hypertension.  We will hold blood pressure medications given      borderline low systolic blood pressure for now and re-add as      indicated.  6. Asthma.  We will continue Advair and albuterol nebulizers.  7. Gastroesophageal reflux disease.  We will provide proton pump      inhibitor intravenously.  We will also use deep vein thrombosis      prophylaxis with subcu Lovenox.      Larina Earthly, M.D.  Electronically Signed     RA/MEDQ  D:  08/25/2008  T:  08/26/2008  Job:  [id number]

## 2010-10-26 NOTE — Discharge Summary (Signed)
[name], [name]              ACCOUNT NO.:  [id number]   MEDICAL RECORD NO.:  [id number]          PATIENT TYPE:  INP   LOCATION:  3023                         FACILITY:  [facility]   PHYSICIAN:  Larina Earthly, M.D.        DATE OF BIRTH:  10-29-1955   DATE OF ADMISSION:  08/25/2008  DATE OF DISCHARGE:  08/29/2008                               DISCHARGE SUMMARY   DISCHARGE DIAGNOSES:  1. Bilateral pneumonia in the setting of remote splenectomy.  2. Hypertension.  3. Type 2 diabetes mellitus on conservative management with diet and      exercise.  4. Heart murmur with a benign echocardiogram.  5. Abnormal liver function tests, presumably secondary to bilateral      pneumonia, now resolving.  6. Hypokalemia status post repletion.   SECONDARY DIAGNOSES:  1. Hyperlipidemia.  2. Obstructive sleep apnea with history of CPAP usage.  3. Gastroesophageal reflux disease with hiatal hernia.  4. Edema, consistent with venous insufficiency changes.  5. History of irritable bowel syndrome.  6. History of minimal depression.  7. History of anaphylaxis to nonsteroidal anti-inflammatory agents.  8. History of pneumonia in February 2007.   PAST SURGICAL HISTORY:  1. History of cholecystectomy.  2. Tonsillectomy.  3. Splenectomy.  4. Total abdominal hysterectomy.   DISCHARGE MEDICATIONS:  EpiPen, Advair 50/500 one puff b.i.d., albuterol  metered-dose inhaler p.r.n., Effexor XR 150 mg p.o. q.a.m., Nexium 40 mg  p.o. daily, fish oil, Aldara per Dermatology, Avelox 400 mg p.o. daily  x7 days, azithromycin 500 mg p.o. daily x7 days, oxycodone 5/325 p.r.n.,  Tylenol 650 mg p.r.n.  The patient was instructed to restart quinapril  in 3 days after discharge at 40 mg a day and restart amlodipine at 5 mg  a day 5 days after discharge.  The patient was to restart simvastatin at  the time of office visit followup after reassessment of liver function  test.   FOLLOWUP PLANS AND DISPOSITION:  The patient is  to be discharged home  and restart her antihypertensives and cholesterol medications as above  and follow up in our office in 6-7 days for chest x-ray, liver function  test, electrolyte assessment, and CBC.   PERTINENT RADIOLOGICAL FINDINGS:  1. CT of the abdomen and pelvis obtained at outside ER on August 24, 2008, prior to admission reveals a left lower lobe pneumonia.      Please note that this was not a chest CT and a 4-mm nodule in the      right middle lobe on the first image that was not completely      evaluated but probably benign, and a negative CT scan of the      pelvis.  2. Chest x-ray on admission here at this hospital on August 25, 2008,      revealed bilateral perihilar and left lower lobe interstitial and      airspace opacities suggesting multilobar pneumonia, most confluent      in the left lower lobe.  Repeat chest x-ray on August 28, 2008,  showed improving bilateral pneumonia.  3. A 2-D echocardiogram obtained for heart murmur revealed an ejection      fraction that was normal at 65-70% with no valve abnormalities.   PERTINENT LABORATORY DATA:  White blood cell count on discharge is 12.1,  decreased from admission white blood cell count of 26,000.  Hemoglobin  12.1, hematocrit 35.8%, platelet count 459.  Sodium 141, potassium 3.4  which we supplemented, BUN 6, creatinine 0.86, glucose 107.  Liver  function test revealed an AST of 27, down from 79; ALT of 57, down from  87; alkaline phosphatase 122; total bilirubin 0.3; albumin 2.9.  Calcium  9.0.  Blood cultures x2 obtained on August 25, 2008, after initiation of  outpatient Avelox was negative.   HISTORY OF PRESENT ILLNESS:  Please see my history and physical from  August 25, 2008, for extensive details; however, this is a 55 year old  Caucasian female with a past medical history of diet-controlled  diabetes, hypertension, hyperlipidemia, obstructive sleep apnea on CPAP,  GERD with history of hiatal hernia  and history of splenectomy in 1993,  who has a history of pneumonia in 2007, presented with a 1-week history  of fever, malaise, and myalgias with chest and nasal congestion to [facility] Emergency Room on August 23, 2008.  At that time, a CT of her  abdomen and pelvis revealed extensive left lower lobe pneumonia and a  white blood cell count of 26,000.  She was discharged on Avelox to home  with her husband; however, she failed outpatient management secondary to  increasing anorexia, nausea, vomiting, and generalized weakness and was  subsequently admitted from my office on August 25, 2008.   HOSPITAL COURSE:  Given the patient's partially immunocompromised state  given her splenectomy, she was started on azithromycin, as well as  Avelox, inside the hospital along with antiemetics, IV fluids, and  supportive therapy consisting of her home regimen of Advair and  albuterol nebulizers, along with mucolytic agents.  Over the next 3-4  days, she continually steadily but slowly improved with slightly  increased oral intake, resolution of her fevers, improvement in her  breathing patterns, reduction in her pain levels, and reduction in her  leukocytosis.  She no longer needed any significant albuterol breathing  treatments or antiemetics for the 48 hours prior to discharge, albeit  her caloric intake was still somewhat marginal.  She remained  hemodynamically stable throughout her hospitalization.  A heart murmur  was auscultated on exam, but she exhibited no symptoms of volume  overload, peripheral vascular disease, or orthostasis with IV fluids in  place.  Echocardiogram was benign as above.  The patient was deemed  appropriate to discharge on August 29, 2008, with a close followup in  outpatient, antibiotics, and pulmonary regimen.      Larina Earthly, M.D.  Electronically Signed     RA/MEDQ  D:  08/29/2008  T:  08/29/2008  Job:  [id number]

## 2010-10-26 NOTE — Discharge Summary (Signed)
NAMECECELIA, [name]              ACCOUNT NO.:  [id number]   MEDICAL RECORD NO.:  [id number]          PATIENT TYPE:  INP   LOCATION:  2012                         FACILITY:  [facility]   PHYSICIAN:  Larina Earthly, M.D.        DATE OF BIRTH:  [birth year]   DATE OF ADMISSION:  08/31/2008  DATE OF DISCHARGE:  09/04/2008                               DISCHARGE SUMMARY   DISCHARGE DIAGNOSES:  1. Bilateral pneumonia in the setting of a remote splenectomy      refractory to outpatient management and requiring second      hospitalization with clinical improvement at this time.  2. Pleuritic chest discomfort requiring admission with CT angiogram      negative for pulmonary embolism and etiology thought to be      secondary to processes involving the bilateral pneumonia.  Cardiac      enzymes negative.  3. Type 2 diabetes, stable with sliding-scale insulin, on outpatient      management with diet and exercise with normal hemoglobin A1c prior      to hospitalization at 6.0%.  4. Hypertension, currently stable off current calcium channel blockade      and ACE inhibitor with continued management deferred secondary to      further outpatient evaluation.  5. Hyperlipidemia with current statin therapy on hold secondary to      elevated liver function tests.  6. Slightly elevated liver transaminases possibly secondary to      bilateral pneumonia with further outpatient management pending.  7. Large complex left thyroid nodule being evaluated by Dr. Jearld Fenton on      an outpatient basis with ultrasound scheduled in approximately 2      months per the patient report with possible surgical removal an      option in the future.   SECONDARY DIAGNOSES:  1. History of heartburn with benign echocardiogram during      hospitalization earlier this month.  2. Obstructive sleep apnea with history of CPAP usage.  3. Gastroesophageal reflux disease with hiatal hernia.  4. History of edema, consistent with venous  insufficiency changes.  5. History of irritable bowel syndrome.  6. History of minimal depression.  7. History of anaphylaxis to nonsteroidal anti-inflammatory agents.  8. History of pneumonia in February 2007.   PAST SURGICAL HISTORY:  Significant for:  1. Laparoscopic cholecystectomy.  2. Tonsillectomy.  3. Splenectomy.  4. Total abdominal hysterectomy.   DISCHARGE MEDICATIONS:  1. Effexor 150 mg daily.  2. Nexium 40 mg daily.  3. Albuterol 1-2 puffs q.4 h. p.r.n.  4. Advair 50/500 one puff b.i.d.  5. Fish oil.  6. Aldara per Dermatology.  7. Avelox 400 mg daily for an additional 5 days.  8. Vicodin 1 tablet a q.4 h. p.r.n., prescription for 40 with 1      refill.  9. Temazepam 30 mg at bedtime, prescription written for 30 with no      refills.  10.Prednisone 10 mg each day for additional 4 days.  Please note that      the patient was on 20 mg prior  to discharge.   The patient was advised not to take any amlodipine, quinapril, or  simvastatin until outpatient followup and management scheduled for September 16, 2008.  She and her husband were further advised that if the patient  has recurrent high fevers, pleuritic chest discomfort, worsening  clinical syndrome with shortness of breath, productive purulent cough,  or high fevers that she is to call our office immediately and/or present  to the emergency room, at which time she may need pulmonary evaluation  for definitive etiology of her pulmonary syndrome with possible  bronchoscopy.   DISCHARGE LABORATORY DATA:  White blood cell count 18.4 increased from  previous day of approximately 13 after prednisone initiation, hemoglobin  12.3, hematocrit 36%, platelet count 488.  Sodium 141, potassium 3.6,  BUN 10, creatinine 0.96, glucose 95, AST 52, ALT 83, alkaline  phosphatase 144, total bilirubin 0.6, albumin 2.6.   Chest x-ray continues to reveal stable to mild radiographic improvement  in diffuse interstitial opacities and  perihilar and peribronchial  opacities compared with a chest x-ray on August 31, 2008, as well as CT  scan.   ADDITIONAL RADIOLOGICAL FINDINGS:  The patient did have CT angiogram on  August 31, 2008, that does reveal a 3.6-cm complex cystic and solid mass  in the left thyroid gland that is dominant with tissue sampling  recommended but again this has been worked up and evaluated by Dr. Jearld Fenton  on an outpatient basis.  Further changes on the CT scan revealed  mediastinal and bilateral hilar lymphadenopathy with intralobular septal  thickening, ground glass attenuation of the upper lobes, 2 distinct 7-mm  nodules identified in the right middle lobe.  Differential diagnosis  included reactive versus neoplastic disease and further recommendations  were to repeat CT scan in 3-6 months if the neoplasm is suspected or 6-  12 months if risk for bronchogenic carcinoma is low.   OTHER PERTINENT LABORATORY DATA:  On admission, white blood cell count  17.8 with a hemoglobin of 12.6.  BUN is 8, creatinine 0.93.  Cardiac  enzymes were unremarkable as above.   HISTORY OF PRESENT ILLNESS:  This is a 55 year old Caucasian female who  was hospitalized from August 25, 2008, through August 29, 2008, for  bilateral community-acquired pneumonia in the setting of remote  splenectomy.  She is discharged on Avelox and azithromycin and did  reasonably well; however, on the day prior or the day after discharge  developed mid substernal chest pain and pleuritic discomfort that also  worsened with left-to-right movement.  This was associated with low-  grade temperatures.  She noted the pain was 10/10, and she was advised  to go to the emergency room where workup with chest x-ray and CT  angiogram were performed as above.  She was given Vicodin and Solu-  Medrol at high dose with some improvement in her symptomatology and  subsequently readmitted for the same bilateral pneumonia.  It was  thought that her chest pain  was secondary to pleuritis from her  community-acquired pneumonia.  There was concern for a resistant  organism, and in addition to azithromycin and Avelox, vancomycin was  added given her multiple allergies to PENICILLIN, CEPHALOSPORINS, and  SULFA drugs.  She is continued on low-dose prednisone.   HOSPITAL COURSE:  With the above-mentioned regimen, including pulmonary  toilet, pain management with Vicodin, prednisone that was initiated at  20 mg a day b.i.d., proton pump inhibitors, as well as broad-spectrum  antibiotic, the patient has clinically  improved gradually but slowly  over the next 3 days such that she was no longer coughing, on a regular  basis remained afebrile, and her white blood cell count improved but  then increased again on the day of discharge with clinical improvement  in her symptomatology.  Again, she was ambulating in the halls, laughing  with friends.  Her caloric intake was good with no nausea or vomiting,  and her pain had subsided to a significant extent.  She requested  discharge home, and given her clinical improvement, I agreed.  It was  discussed with both the patient and the husband that we still do not  know the proper etiology of the infective organism and that if she were  to decompensated home on oral Avelox that she may need readmission and  possible pulmonary evaluation with possible bronchoscopy for definitive  etiology.  She was told to follow up on September 16, 2008, at which time a  CBC, CMET, and chest x-ray will be repeated.  She will also need further  reevaluation with a chest CT in the future pending clinical outcomes.  She was further advised to hold her blood pressure medications and  statin medication given their relationship to possible headache syndrome  prior to admission with reinitiation of those on stepwise fashion upon  reevaluation in our clinic.  Dr. Jearld Fenton is to reevaluate the patient's  left thyroid cyst on an outpatient basis with  ultrasound and possible  removal.  Her diabetes remained stable on sliding scale insulin, and she  will continue diet and exercise therapy on an outpatient basis.      Larina Earthly, M.D.  Electronically Signed     RA/MEDQ  D:  09/04/2008  T:  09/05/2008  Job:  [id number]

## 2010-10-26 NOTE — H&P (Signed)
[name], [name] NO.:  [id number]   MEDICAL RECORD NO.:  [id number]          PATIENT TYPE:  OBV   LOCATION:  2012                         FACILITY:  [facility]   PHYSICIAN:  Barry Dienes. Eloise Harman, M.D.DATE OF BIRTH:  1955-12-17   DATE OF ADMISSION:  08/31/2008  DATE OF DISCHARGE:                              HISTORY & PHYSICAL   CHIEF COMPLAINT:  Chest pain.   HISTORY OF PRESENT ILLNESS:  The patient is a 55 year old white woman  who was hospitalized from August 25, 2008 until August 29, 2008 for  community-acquired pneumonia.  She was discharged on continued treatment  with Avelox 400 mg daily and azithromycin 500 mg daily.  She initially  did reasonably well.  On the day prior to discharge, she began to  develop mid substernal chest pain that radiated to both scapulae.  This  pain was pleuritic and worse with any left-to-right movement.  It was  associated with low-grade temperatures and a dry cough.  Today, when the  pain increased to 10/10 intensity, she called and was advised to go to  the emergency room for expeditious evaluation.  In the emergency room,  she has been treated with Vicodin and Solu-Medrol 125 mg IV.  In  addition, she has had a CT angiogram of the chest with results described  below.  She was admitted for further evaluation.   PAST MEDICAL HISTORY:  1. March 2010, bilateral pneumonia in the setting of remote      splenectomy.  2. Hypertension.  3. Diabetes mellitus, type 2 controlled with diet and exercise.  4. Heart murmur with a benign echocardiogram.  5. Abnormal liver function tests, improved with treatment of      pneumonia.  6. Hypokalemia, corrected with repletion.  7. Hyperlipidemia.  8. Obstructive sleep apnea with history of CPAP usage.  9. Gastroesophageal reflux disease with hiatal hernia.  10.Chronic venous insufficiency of the legs.  11.Irritable bowel syndrome.  12.Minimal depression.  13.Anaphylaxis to nonsteroidal  anti-inflammatory agents.  14.History of pneumonia in February 2007.   PAST SURGICAL HISTORY:  1. Laparoscopic cholecystectomy.  2. Tonsillectomy.  3. Splenectomy.  4. Total abdominal hysterectomy.   MEDICATIONS:  1. EpiPen as needed.  2. Advair 50/500 one puff p.o. b.i.d.  3. Albuterol HFA 2 puffs q.i.d. p.r.n. wheezing.  4. Effexor XR 150 mg every a.m.  5. Nexium 40 mg daily.  6. Fish oil daily.  7. Aldara per dermatologist.  8. Avelox 400 mg daily.  9. Azithromycin 500 mg daily.  10.Oxycodone 5/325 p.r.n.  11.Tylenol 650 mg p.r.n.   She was also to restart Accupril 40 mg daily in 1 day with amlodipine 5  mg daily and simvastatin 40 mg daily.   ALLERGIES TO MEDICATIONS AND INTOLERANCES:  ASPIRIN, IBUPROFEN, and  DEMEROL have been associated with anaphylaxis.  CEPHALOSPORINS,  PENICILLINS, and SULFA drugs have been associated with hives.  CODEINE  has been associated with nausea.  Vicodin is not a problem for her.  She  has tolerated Vicodin well.   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  She has no history  of tobacco or alcohol abuse.   FAMILY HISTORY:  Significant for hypertension, diabetes mellitus type 2,  Paget disease, colon polyps, obstructive sleep apnea, and colon cancer.   REVIEW OF SYSTEMS:  She has been able to eat without difficulty.  She  has not had nausea, vomiting, diarrhea, constipation, dysuria, or leg  pain.  She has not had exertional chest pain.  She has mild insomnia and  generally uses her CPAP equipment.   PHYSICAL EXAMINATION:  VITAL SIGNS:  Blood pressure 135/79, pulse 87,  respirations 15, temperature 99.7, and pulse oxygen saturation was 93%  on room air.  GENERAL:  She is a mildly overweight white female who had pleuritic  chest pain that now is mild.  HEAD, EYES, EARS, NOSE, AND THROAT:  Significant for a approximately 2-  cm diameter erythematous to raised  patch on the right cheek consistent  with her known skin cancer.  NECK:  Supple and without jugular venous distention or carotid bruit.  There is shotty left anterior cervical lymphadenopathy that is somewhat  tender.  CHEST:  Clear to auscultation.  HEART:  Regular rate and rhythm with a systolic ejection murmur, grade  1/6 at the left sternal border.  ABDOMEN:  Normal bowel sounds and no hepatosplenomegaly or tenderness.  EXTREMITIES:  Without cyanosis, clubbing, or edema.  NEUROLOGIC:  She was alert and well-oriented with no focal neurologic  deficits.   LABORATORY STUDIES:  White blood cell count was 17.8 with hemoglobin  12.9, hematocrit 37.9, platelets 557, and 84% neutrophils.  Serum sodium  133, potassium 3.5, chloride 99, carbon dioxide 25, BUN 8, creatinine  0.93, and glucose 166.  Total protein 7.1, albumin 3.2, and lipase 34.  CK-MB was less than 1.0.  Troponin I was less than 0.05.  EKG showed  normal sinus rhythm.  Chest x-ray showed diffuse interstitial process  without change.  A CT angiogram of the chest showed mediastinal and  bilateral hilar lymphadenopathy.  Differential considerations would  include reacting versus neoplastic disease.  There was interlobular  septal thickening with ground-glass attenuation in the upper lobes and  some associated central airspace disease in both lower lobes and more  confluent airspace disease in the left lung base.  This was felt due to  pulmonary edema versus diffuse infection or inflammatory disease.  Two  distinct 7-mm nodules were identified in the right middle lobe.  The  patient also had a 3.6 cm complex cystic and solid mass in the left  thyroid gland.  There was felt to be a dominant lesion with tissue  sampling recommended.   IMPRESSION AND PLAN:  1. Chest pain:  This is most likely due to pleuritis from her      community-acquired pneumonia.  The interval increase in her white      blood cell count is suggestive of a  resistant organism.      Alternatively, it could be due to her significant pleuritic pain.      I plan to add vancomycin to ongoing treatment with Avelox and      azithromycin.  Her allergies to both PENICILLINS and CEPHALOSPORINS      as well as SULFA DRUGS constraints antibiotic choices.  She will      continue on low-dose prednisone with scheduled Vicodin for her      pleuritic pain.  Given her lymphadenopathy, a followup CT is  recommended.  2. Left thyroid mass.  Unclear etiology.  Certainly, this could be a      partially cystic neoplasm.  When her acute process has stabilized,      we would consider obtaining a tissue biopsy.  3. Diabetes mellitus, type 2.  Well-controlled with A1c level of 6.0%      on diet and exercise.  Given endogenous and exogenous increased      corticosteroids, she will be placed on sliding scale insulin as      needed.           ______________________________  Barry Dienes Eloise Harman, M.D.     DGP/MEDQ  D:  08/31/2008  T:  09/01/2008  Job:  [id number]

## 2010-10-29 NOTE — Discharge Summary (Signed)
[name], [name]              ACCOUNT NO.:  [id number]   MEDICAL RECORD NO.:  [id number]          PATIENT TYPE:  INP   LOCATION:  A302                          FACILITY:  [facility]   PHYSICIAN:  Margaretmary Dys, M.D.DATE OF BIRTH:  1955-09-13   DATE OF ADMISSION:  02/08/2006  DATE OF DISCHARGE:  09/01/2007LH                                 DISCHARGE SUMMARY   DISCHARGE DIAGNOSES:  1. Viral gastroenteritis.  2. Nausea and vomiting.  3. Dehydration.  4. History of fatty liver with slight elevation in liver function tests.  5. Diabetes mellitus.   DISCHARGE MEDICATIONS:  The patient is to resume all her home medications  without any changes.   HOSPITAL COURSE:  She is a 55 year old Caucasian female with diabetes,  gastroesophageal reflux disease, who presented to the emergency room with  intractable nausea, vomiting, and diarrhea.  It was fairly acute onset.  Kindly review the history and physical dictated by Dr. Josefine Class on day of  admission.   The patient suspects that the chicken and biscuits she had at [name] for lunch may have precipitated this.  She felt it just did not  taste right. The patient was admitted because she was feeling dehydrated and  was tachycardic.  She thought she was also slightly hypotensive and required  IV fluids for resuscitation.  The patient was started empirically on Flagyl  and Cipro because of concerns of leukocytosis.  During the course of  hospitalization she did fairly well with no concerns.  Her diarrhea resolved  and her white count also improved.  She had no fevers and her cramping also  subsequently improved.  The cause suggested must be viral gastroenteritis.  The patient's medications were continued in the hospital.   DISPOSITION:  The patient was discharged home.   DIET:  Diabetic diet 1800 ADA, consistent carbohydrates and heart healthy  diet.   ACTIVITIES:  As tolerated.   FOLLOWUP:  As needed with her primary  care physician, Larina Earthly, M.D.   SPECIAL INSTRUCTIONS:  The patient advised to return to the emergency room  if nausea and vomiting recurs or if she continues to have abdominal pain,  crampiness, or distention or worsening diarrhea.      Margaretmary Dys, M.D.  Electronically Signed     AM/MEDQ  D:  02/11/2006  T:  02/11/2006  Job:  [id number]

## 2010-10-29 NOTE — Consult Note (Signed)
[name], [name]              ACCOUNT NO.:  [id number]   MEDICAL RECORD NO.:  [id number]          PATIENT TYPE:  INP   LOCATION:  5733                         FACILITY:  [facility]   PHYSICIAN:  Zola Button T. Lazarus Salines, M.D. DATE OF BIRTH:  [birth year]   DATE OF CONSULTATION:  04/09/2005  DATE OF DISCHARGE:                                   CONSULTATION   CHIEF COMPLAINT:  Severe sore throat.   HISTORY:  A 55 year old white female hospitalized two days ago for hydration  and pain control with an episode of severe pharyngotonsillitis. She was  evaluated by my partner, Dr. Pollyann Kennedy in the office on that date and that was  his conclusion. In the hospital, her white count has been going up and was  16,000 today with no differential. She continues to have fevers as high as  102. She continues to have severe sore throat. She has been having tonsil  problems to some degree off and on for years. She has undergone sinus  surgery per Dr. Jearld Fenton with decent control of her sinusitis. She had a CT  scan done two days ago showing rather bulky lymphadenopathy in both sides of  the neck and in the pharynx. No evidence of abscess. No evidence of  sinusitis. ENT was reconsulted for failure to improve. She had been treated  outpatient I believe with Biaxin and then in the hospital with intravenous  Avelox. She is allergic to penicillin, clindamycin, and numerous other  antibiotics.   She claims that swallowing is painful but once it gets past her throat it  goes down okay. Breathing is no problem. Her voice seems slightly raspy. She  is tender in both sides of her neck but not stiff.   EXAMINATION:  This is a somewhat overweight middle-aged white female who  appears mildly distressed but no true hot potato voice, nor significant  halitosis. She is not warm to touch at the present time. Her voice is very  slightly raspy but basically phonatory and functional. No stridor. Mental  status is appropriate. She hears  well in conversational speech. Respirations  unlabored through the nose. The head is atraumatic and neck supple. Cranial  nerves intact. She has a moderate wax accumulation in the right ear canal  and the left side. The drum is normal and aerated. The anterior nose is  clear and not congested. Oral cavity reveals teeth in good repair with moist  membranes. Oropharynx reveals 3+ tonsils with a shaggy necrotic exudate  bilaterally. No significant swelling or asymmetry of the uvula or soft  palate. I could not easily see nasopharynx or hypopharynx. Neck has  moderately bulky jugulodigastric adenopathy on both sides without induration  or severe tenderness.   IMPRESSION:  Acute bacterial tonsillitis with no anaerobic coverage thus  far. Doubt mononucleosis. I wonder if her immune status is intact with  multiple infections in both tonsils and sinuses. She claims to have no  immune issues including diabetes, HIV exposure, or steroid therapy.   PLAN:  I discussed this carefully with her and her husband. I do think  eventually she should  have her tonsils removed. For now, we will try viscous  Xylocaine to see if this helps with some topical anesthesia as well as Majik  mouth wash. I have written her an order for Roxicet liquid to titrate her  pain control carefully. I will give her one dose of Decadron. We will change  to Primaxin 500 mg q.i.d. with its good anaerobic coverage. We will see if  we can check a differential on the CBC from today. If not, we will drawn  other one.      Gloris Manchester. Lazarus Salines, M.D.  Electronically Signed     KTW/MEDQ  D:  04/09/2005  T:  04/10/2005  Job:  [id number]   cc:   Suzanna Obey, M.D.  Fax: [phone]

## 2010-10-29 NOTE — Op Note (Signed)
[name], [name]                        ACCOUNT NO.:  [id number]   MEDICAL RECORD NO.:  [id number]                   PATIENT TYPE:  OIB   LOCATION:  2890                                 FACILITY:  [facility]   PHYSICIAN:  Burnard Bunting, M.D.                 DATE OF BIRTH:  1955-08-23   DATE OF PROCEDURE:  02/18/2003  DATE OF DISCHARGE:  02/18/2003                                 OPERATIVE REPORT   PREOPERATIVE DIAGNOSIS:  Left knee lateral meniscal tear.   POSTOPERATIVE DIAGNOSIS:  Left knee lateral meniscal tear.   PROCEDURE:  Left knee diagnostic and operative arthroscopy with resection of  lateral meniscal tear.   SURGEON ATTENDING:  Burnard Bunting, M.D.   ANESTHESIA:  General endotracheal.   ESTIMATED BLOOD LOSS:  2 mL.   DRAINS:  None.   OPERATIVE FINDINGS:  1. Examination under anesthesia:  Range of motion 0 to 120 degrees of     flexion with stability to varus and valgus stress at 0 and 30 degrees     with intact ACL and PCL, negative pivot shift and Lachman's.  2. Diagnostic and operative arthroscopy:     a. Intact patellofemoral compartment.     b. No loose bodies in medial or lateral gutters.     c. Grade 1 to 2 chondromalacia over 60% of the medial femoral condyle and        medial tibial plateau, with intact medial meniscus.     d. Intact ACL and PCL.     e. Intact articular cartilage, lateral compartment, femur and tibia, but        with tear of the lateral meniscus extending from the mid-lateral        portion anteriorly involving at its deepest portion, 80% of the        anteroposterior width of the lateral meniscus.   PROCEDURE IN DETAIL:  The patient was brought to the operating room where  general anesthesia was induced and preoperative IV antibiotics were  administered.  The left leg was prepped with Duraprep solution and draped in  a sterile manner.  Topographic anatomy of the left knee was identified,  including the medial, lateral and inferior  margins of the patella as well as  the patellar tendon and the joint line on the lateral side.  Anterior  inferolateral portal was first established after spinal needle localization.  Anterior inferomedial portal was established after localization under direct  visualization with a spinal needle.  Diagnostic arthroscopy was performed.  Patellofemoral compartment was intact.  Medial compartment showed  chondromalacia, grade 1 and 2, on the femoral condyle and tibial plateau.  The medial meniscus was intact.  ACL and PCL were intact.  The lateral  compartment was inspected in the figure-of-four position and a lateral  meniscal tear with both bucket handle, radial and horizontal components was  noted.  This tear was debrided  back to a stable rim using a combination of a  basket, punch and shavers.  All-in-all, at its deepest margin, only about  20% of the meniscal tissue remained over a 5- to 6-mm area.  Following  meniscal resection, the remaining portion of the lateral meniscus was stable  to probing.  At this time, the knee was  thoroughly irrigated.  Instruments were removed and portals were closed  using 3-0 nylon suture.  A solution of Marcaine with epinephrine and 8 mg of  morphine was placed into the knee.  The patient was then placed in the bulky  knee dressing and ice pack.                                                Burnard Bunting, M.D.    GSD/MEDQ  D:  02/18/2003  T:  02/19/2003  Job:  [id number]

## 2010-10-29 NOTE — Op Note (Signed)
NAMEINDIANA, [name]              ACCOUNT NO.:  [id number]   MEDICAL RECORD NO.:  [id number]          PATIENT TYPE:  OIB   LOCATION:  2550                         FACILITY:  [facility]   PHYSICIAN:  Suzanna Obey, M.D.       DATE OF BIRTH:  December 09, 1955   DATE OF PROCEDURE:  07/07/2005  DATE OF DISCHARGE:                                 OPERATIVE REPORT   PREOPERATIVE DIAGNOSIS:  Chronic tonsillitis.   POSTOPERATIVE DIAGNOSIS:  Chronic tonsillitis.   PROCEDURE:  Tonsillectomy.   ANESTHESIA:  General endotracheal tube anesthesia.   ESTIMATED BLOOD LOSS:  Approximately 5 mL.   INDICATIONS FOR PROCEDURE:  This is a 55 year old who has had significant  repetitive tonsillitis problems.  She has had to be  hospitalized several  times because of the severeness of the episodes.  She has had intermittent  swelling in her neck in the past and this now is presumed to be previous  tonsil problems. She was informed of the risks and benefits of the procedure  including bleeding, infection, velopharyngeal insufficiency, change in the  voice, injury to the teeth, chronic pain, and risk of the anesthetic.  All  questions were answered and consent was obtained.   DESCRIPTION OF PROCEDURE:  Patient taken to the operating room and placed in  the supine position.  After adequate general endotracheal tube  anesthesia,was placed in the rose position and draped in the usual sterile  manner. The Crowe-Davis mouth gag was inserted, retracted and suspended from  the Mayo stand.  The patient had somewhat small mouth and the gag had to be  inserted from one side and then the other to expose the tonsil. The tonsil  did go fairly significantly down the pharyngeal wall toward the base of  tongue.  The left tonsil begun making a left anterior tonsillar pillar  incision, identified the capsule of tonsil and removed it with  electrocautery dissection.  There was significant amount of tonsilliths  within the tonsil.   These were suctioned out.  The right tonsil removed in  the same fashion.  Hemostasis then achieved with suction cautery.  Hypopharynx, esophagus and stomach were suctioned with the NG tube.  The  Crowe-Davis was released and resuspended.  There was hemostasis present in  all locations.  The patient was then awakened and brought to the recovery  room in stable condition.  Counts correct.           ______________________________  Suzanna Obey, M.D.     JB/MEDQ  D:  07/07/2005  T:  07/07/2005  Job:  [id number]

## 2010-10-29 NOTE — Group Therapy Note (Signed)
[name], [name]              ACCOUNT NO.:  [id number]   MEDICAL RECORD NO.:  [id number]          PATIENT TYPE:  INP   LOCATION:  A302                          FACILITY:  [facility]   PHYSICIAN:  Margaretmary Dys, M.D.DATE OF BIRTH:  10/22/1955   DATE OF PROCEDURE:  02/10/2006  DATE OF DISCHARGE:                                   PROGRESS NOTE   SUBJECTIVE:  Patient concedes to have nausea and vomiting.  She vomited a  bit this morning.  She has some abdominal  pain with crampiness.  I think  most of this is viral gastroenteritis.  She denies any fevers or chills, no  headaches and no muscle aches.   OBJECTIVE:  GENERAL:  Conscious, alert, comfortable, not in acute distress.  VITAL SIGNS:  Blood pressure was 129/85, pulse of 81, respirations 20, T-Max  98.6.  HEENT:  Normocephalic, atraumatic.  Oral mucosa was moist with no exudates.  NECK:  Supple, no JVD or lymphadenopathy.  LUNGS:  Clear parenterally with good air entry bilaterally.  HEART:  S1, S2, regular.  No S3, S4, gallops or rubs.  ABDOMEN:  Soft, nontender.  Bowel sounds positive.  No muscles palpable.  EXTREMITIES:  No edema, no induration or tenderness was noted.   LABORATORY DATA:  White blood cell count is down to 11.1, hemoglobin of  12.2, hematocrit 35.7.  Platelet count was 351 with no left shift, sodium  140, potassium 4.0, chloride of 108, CO2 25, glucose 103, BUN of 5,  creatinine 0.9.  Calcium is 8.0.   ASSESSMENT/PLAN:  1. Acute gastroenteritis.  Patient continued to have some nausea and      vomiting, will continue on IV hydration, Phenergan and Zofran as needed      for nausea and vomiting.  Will keep patient on clear liquids for now.   Patient remains empirically on Flagyl and Cipro, which I think we can  discontinue tomorrow if cultures remain negative.  1. Diabetes.  Blood sugars are stable.  Continue sliding scale.  2. History of anxiety disorder.  Will re-start her Effexor.  Patient      thinks she  may be able to keep down some of her medications.   DISPOSITION:  Continue IV fluids and antibiotics, review in the morning,  await final result of stool cultures.  C. Diff. is negative at this time.     Margaretmary Dys, M.D.  Electronically Signed    AM/MEDQ  D:  02/10/2006  T:  02/10/2006  Job:  [id number]

## 2010-10-29 NOTE — Op Note (Signed)
[name], [name]              ACCOUNT NO.:  [id number]   MEDICAL RECORD NO.:  [id number]          PATIENT TYPE:  AMB   LOCATION:  ENDO                         FACILITY:  [facility]   PHYSICIAN:  Bernette Redbird, M.D.   DATE OF BIRTH:  Oct 16, 1955   DATE OF PROCEDURE:  10/13/2005  DATE OF DISCHARGE:                                 OPERATIVE REPORT   PROCEDURE:  Upper endoscopy.   INDICATION:  55 year old female with longstanding reflux symptoms, on twice  daily Nexium plus night time ranitidine, for LPR symptoms.  She also has a  history of transiently heme-positive stool.   FINDINGS:  Essentially normal exam.   PROCEDURE:  The patient provided written consent for the procedure.  Sedation was fentanyl 50 mcg, Versed 4 mg and Phenergan 25 mg IV without  arrhythmias or desaturation.  The Olympus video endoscope was passed under  direct vision.  I inspected the larynx quite carefully.  The vocal cords and  arytenoid cartilages looked normal.  There was some questionable thickening  or prominence of posterior commissure which might correlate with some reflux  laryngitis but if so, it is a fairly subtle findings.   The esophagus was entered.  It had entirely normal mucosa.  There was no  evidence of reflux esophagitis, Barrett's esophagus, varices, infection,  neoplasia or any ring stricture or hiatal hernia.   The stomach contained a moderate bilious residual which was suctioned up but  there was no evidence of bile reflux gastritis, no evidence of any source of  heme-positive stool such as erosions, ulcers, polyps, masses or vascular  ectasia and a retroflexed view of the cardia was normal with no evidence of  a hiatal hernia.  The pylorus, duodenal bulb and second duodenum looked  normal.  The scope was removed from the patient, who tolerated the procedure  well and without any apparent complications.   IMPRESSION:  Normal endoscopy.  No source of transiently Hemoccult positive  stool endoscopically evident.  No evidence of active reflux laryngitis, on  medical therapy.   PLAN:  Procedure to colonoscopic evaluation.           ______________________________  Bernette Redbird, M.D.     RB/MEDQ  D:  10/13/2005  T:  10/14/2005  Job:  [id number]   cc:   Larina Earthly, M.D.  Fax: [phone]

## 2010-10-29 NOTE — Op Note (Signed)
NAME:  [name], [name]                        ACCOUNT NO.:  [id number]   MEDICAL RECORD NO.:  [id number]                   PATIENT TYPE:  INP   LOCATION:  5506                                 FACILITY:  [facility]   PHYSICIAN:  Suzanna Obey, M.D.                    DATE OF BIRTH:  1955-12-06   DATE OF PROCEDURE:  01/08/2004  DATE OF DISCHARGE:                                 OPERATIVE REPORT   PREOPERATIVE DIAGNOSIS:  Chronic sinusitis with right frontal possible  complications.   POSTOPERATIVE DIAGNOSIS:  Chronic sinusitis with right frontal possible  complications.   PROCEDURE:  Left ethmoidectomy, right frontal sinusotomy with placement of  rain stand, right ethmoidectomy, and Stealth image guidance system.   ANESTHESIA:  General endotracheal tube.   ESTIMATED BLOOD LOSS:  Less than 10 mL.   INDICATIONS FOR PROCEDURE:  This is a 55 year old who presented with severe  right-sided headache and workup with an MRI scan had shown complete  opacification of the right frontal sinus with possible extension of disease  into her epidural space.  A CT scan was then performed showing no obvious  erosion of the posterior table of the frontal sinus.  Given the nature of  this presentation, the patient was admitted and placed on intravenous  antibiotics.  The patient was then discussed regarding endoscopic sinus  surgery and possible frontal trephination.  She wants to proceed.  Risks  included bleeding, infection, scarring, CSF leak, change in the sense of  smell, blindness, scarring of the sinus cavity, numbness of the forehead,  and risks of the anesthetic.  All questions were answered and consent was  obtained.   DESCRIPTION OF PROCEDURE:  The patient was taken to the operating room and  placed in the supine position.  After adequate general endotracheal tube  anesthesia, she was prepped and draped in the usual sterile fashion.  The  Stealth helmet was positioned with its four cups on the  forehead and then  calibrated.  The Oxymetazoline pledgets were placed into the nose  bilaterally and then the middle turbinates were injected with 1% lidocaine  with 1:100,000 epinephrine.  The right side was begun first using the  Stealth Guidance System, the frontal sinus was opened using the 40 degree  microdebrider and guidance.  This opened up into the frontal sinus removing  the thickened scarred tissue and polypoid material.  The sinus was then  probed with the curved guided system and the probe went nicely up into the  frontal sinus.  This did not reveal any significant purulent material.  The  microdebrider was then placed up into the frontal sinus and debrided some of  the mucosa.  It did have thickened mucosa up in the frontal sinus.  The  Rains stent was then trimmed and placed using the sinus seeker and it fit  nicely up into the frontal sinus opening.  The  Oxymetazoline pledgets were  placed for hemostasis.  The left side was then also with Stealth guidance,  the posterior ethmoid region was opened with the microdebrider and guidance  and this was all opened up nicely.  There was thickened polypoid material in  this location.  This was the only portion of the ethmoid that had infection.  Remaining sinus cavities looked good except for just slight mucosal  thickening.  The Afrin pledgets were then placed in both sides.  The patient  was then suctioned out of blood and debris in the nasopharynx.  She was  awakened and brought to the recovery room in stable condition.  Needle,  sponge, and instrument counts correct.                                              Suzanna Obey, M.D.   Cordelia Pen  D:  01/08/2004  T:  01/08/2004  Job:  [id number]

## 2010-10-29 NOTE — H&P (Signed)
[name], [name]              ACCOUNT NO.:  [id number]   MEDICAL RECORD NO.:  [id number]          PATIENT TYPE:  INP   LOCATION:  5733                         FACILITY:  [facility]   PHYSICIAN:  [name], M.D.        DATE OF BIRTH:  1955-10-20   DATE OF ADMISSION:  04/07/2005  DATE OF DISCHARGE:                                HISTORY & PHYSICAL   CHIEF COMPLAINT:  Continued anorexia, high fever, odynophagia and swollen  neck despite antibiotic usage.   HISTORY OF PRESENT ILLNESS:  This is a 55 year old Caucasian female who has  a history of chronic sinusitis, seasonal allergic rhinitis, asthma,  gastroesophageal reflux disease, and a history of requirement for stent  placement and emergency sinus surgery in 2005 and history of a cervical  lymph node biopsy. She was evaluated on April 04, 2005 with a fever of  100.8 degrees Fahrenheit, cervical lymphadenopathy that was tender,  significant pharyngitis with leukocytosis of 16.9 thousand with a negative  strep test. She was treated empirically with Biaxin XL 2 p.o. q.d. x10 days  given her multiple antibiotic allergies. She was also given prescriptions  for Darvocet and Phenergan and encouraged to force her fluids on an  outpatient basis.   Over the last three days, the patient has had continued fever with bed max  fever of 103 degrees, continued sore throat, odynophagia, increasing  swelling, anorexia, nausea with dry heaves, and no clinical improvement  despite antibiotics, analgesia and antiemetics. Certainly, there was a  significant concern for dehydration on both the patient and husband's part  and now they are subsequently admitted for further evaluation. ENT  consultation is pending. She will receive IV antibiotics, IV fluids,  radiological evaluation with neck and sinus CT scan.   REVIEW OF SYSTEMS:  Positive for fevers, chills, odynophagia, nausea, dry  heaves, swollen neck, sinus tenderness, difficulty swallowing but  negative  for shortness of breath, cough, chest pain, blurred vision, headaches other  than that related to the frontal sinuses. Negative for change in bowel  habits or new musculoskeletal complaints.   PROBLEM LIST:  1.  History of splenectomy.  2.  Status post aneurysm repair.  3.  Total abdominal hysterectomy.  4.  History of cholecystectomy.  5.  Irritable bowel syndrome.  6.  Gastroesophageal reflux disease with hiatal hernia.  7.  Hyperlipidemia.  8.  Depression with stress disorder.  9.  Hypertension.  10. Seasonal allergic rhinitis.  11. Asthma.  12. Chronic sinusitis.  13. NSAID-induced anaphylaxis.  14. History of a degenerative joint disease and meniscal tear status post      arthroscopic by Dr. August Saucer in the past.  15. Intermittent migraine headaches.  16. Peanut allergy.  17. History of a stent placement in the right frontal, ethmoid sinus in mid      2005 by Dr. Jearld Fenton.   CURRENT MEDICATIONS:  1.  Lasix 40 milligrams p.o. q.d.  2.  Accupril 40 milligrams p.o. q.d.  3.  Advair 250/50 1 puff b.i.d.  4.  Albuterol metered-dose inhaler p.r.n.  5.  Norvasc 5 milligrams each  day.  6.  Zocor 40 milligrams each day.  7.  Effexor XR 75 milligrams each day.  8.  Darvocet p.r.n.  9.  Ranitidine 300 milligrams at h.s.  10. Nexium 40 milligrams p.o. b.i.d.  11. Singular 10 milligrams q.d.  12. Alavert 10 milligrams p.o. q.d.   SOCIAL HISTORY:  The patient is married for approximately 18-19 years. 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 Denies any tobacco or alcohol use.   FAMILY HISTORY:  Significant for father with colon polyps, Paget's disease,  type 2 diabetes mellitus, hypertension, sleep apnea. Mother is significant  for hypertension. Grandparents with colon cancer. The patient has no  siblings. The patient does have one daughter and one sons who are alive and  well.   MEDICATION ALLERGIES:  CODEINE, SULFA, PENICILLIN, CIPRO, DEMEROL,  CEPHALOSPORINS and  NONSTEROIDAL ANTI-INFLAMMATORY AGENTS.   PHYSICAL EXAM:  GENERAL:  We have a Caucasian female, mildly obese with  significant malaise. Unable to sit up for prolonged periods of time. Able to  answer some questions but clearly in pain with speaking.  VITAL SIGNS:  Temperature 101.2 degrees Fahrenheit, pulse 112, blood  pressure 110/66. 96% oxygen saturation on room air.  HEENT:  Sclerae anicteric. Tympanic membranes clear bilaterally. There is  some sinus tenderness. There is significant oropharyngeal swelling with  whitish exudates and plaques and question of thrush. There is significant  cervical lymphadenopathy bilaterally with tenderness.  LUNGS: Clear to auscultation bilaterally.  CARDIOVASCULAR: Reveals a regular but tachycardiac rhythm.  ABDOMINAL:  Exam is soft, nontender, nondistended abdomen. Bowel sounds are  present.  EXTREMITIES:  Reveals no edema. Pedal pulses are intact.  NEUROLOGICAL: Exam is grossly nonfocal.   ASSESSMENT/PLAN:  1.  Severe cervical lymphadenopathy with pharyngitis and questionable      sinusitis:  We will check neck and sinus CT with contrast, give the      patient IV Avelox and defer use of anaerobic coverage with clindamycin      at this time given sensitivities to the same. The patient does have      multiple antibiotic allergies and may need infectious disease consult if      indicated. Ear/nose/throat consultation is pending at this time. We will      question the use of prednisone in the current setting. Oxygen saturation      is okay as well as the patient is hemodynamically stable.  2.  Hypertension:  We will continue ACE inhibitor and defer diuretics given      possible dehydration. Doubt current symptomatology has anything to do      with angioedema induced by the ACE inhibitor.  3.  Asthma:  We will start nebulizers and Advair and monitor for      bronchospasm. 4.  Gastroesophageal reflux disease:  We will change Nexium to intravenous       proton pump inhibitor and back to oral regimen as oral intake improves.      [name], M.D.  Electronically Signed     RA/MEDQ  D:  04/07/2005  T:  04/08/2005  Job:  [id number]   cc:   Jessica Priest, M.D.  Fax: [phone]   Suzanna Obey, M.D.  Fax: [phone]

## 2010-10-29 NOTE — Op Note (Signed)
[name], [name] NO.:  [id number]   MEDICAL RECORD NO.:  [id number]          PATIENT TYPE:  REC   LOCATION:  REH                           FACILITY:  [facility]   PHYSICIAN:  Burnard Bunting, M.D.    DATE OF BIRTH:  January 24, 1956   DATE OF PROCEDURE:  01/19/2005  DATE OF DISCHARGE:                                 OPERATIVE REPORT   PREOPERATIVE DIAGNOSIS:  Left knee recurrent lateral meniscal tear.   POSTOPERATIVE DIAGNOSIS:  Left knee recurrent lateral meniscal tear.   PROCEDURE:  Left knee diagnostic arthroscopy with a partial lateral  meniscectomy.   SURGEON:  Burnard Bunting, M.D.   ANESTHESIA:  General endotracheal.   ESTIMATED BLOOD LOSS:  Minimal.   FINDINGS:  1.  Early chondromalacia on the lateral femoral condyle, recurrent, to the      lateral meniscus, medial compartment head.  2.  Early grade 2 to 3 chondromalacia of the patellofemoral compartment, and      also had early 1 to 2 grade chondromalacia.  The cruciate ligaments were      intact.   DESCRIPTION OF PROCEDURE:  The patient was brought to the operating room  where general endotracheal anesthesia was introduced.  __________ the left  knee was prepped with DuraPrep and __________ and draped in a sterile  manner.  The topographic anatomy of the knee was identified including the  medial and lateral margins of the patella tendons as well as the medial and  lateral margin of the patella and the joint line on both the medial and  lateral sides.  Anterior, femoral, lateral compression established.  The  anterior inferior medial portals were then established under direct  visualization.  A diagnostic arthroscopy was then performed.  A partial  synovectomy was then performed in order to enhance visualization; however, a  recurrent lateral meniscal tear was encountered and this involved a  significant portion of the meniscus, approximately 80%-90% of the anterior  posterior width in the meniscus,  particularly the anterior border which  required resection of an unstable fragment.  This was then under direct  visualization.  The patient's medial meniscus was stable and intact.  Early  degenerative changes were noted.  The knee joint was thoroughly irrigated.  No loose bodies were present in the medial or lateral gutters or in the  suprapatellar pouch.  The instruments were removed.  The portals were closed  using #3-0 nylon suture.   The patient tolerated the procedure well without any complications.      GSD/MEDQ  D:  01/19/2005  T:  01/19/2005  Job:  [id number]

## 2010-10-29 NOTE — Procedures (Signed)
[name], [name]              ACCOUNT NO.:  [id number]   MEDICAL RECORD NO.:  [id number]          PATIENT TYPE:  INP   LOCATION:  A302                          FACILITY:  [facility]   PHYSICIAN:  Edward L. Juanetta Gosling, M.D.DATE OF BIRTH:  1955/12/07   DATE OF PROCEDURE:  02/08/2006  DATE OF DISCHARGE:                                EKG INTERPRETATION   1654; February 08, 2006.  The rhythm is sinus tachycardia with a rate of about  110.  There is left atrial enlargement.  Generally there is low voltage  although full criteria for low voltage is not met.  There is small Q waves  in the septal leads which could indicate a previous infarction.  Clinical  correlation is suggested.  Abnormal electrocardiogram.      Oneal Deputy. Juanetta Gosling, M.D.  Electronically Signed     ELH/MEDQ  D:  02/10/2006  T:  02/10/2006  Job:  [id number]

## 2010-10-29 NOTE — Discharge Summary (Signed)
[name], [name]              ACCOUNT NO.:  [id number]   MEDICAL RECORD NO.:  [id number]          PATIENT TYPE:  INP   LOCATION:  5736                         FACILITY:  [facility]   PHYSICIAN:  Larina Earthly, M.D.        DATE OF BIRTH:  [birth year]   DATE OF ADMISSION:  04/07/2005  DATE OF DISCHARGE:  04/12/2005                                 DISCHARGE SUMMARY   DISCHARGE DIAGNOSES:  1.  Severe tonsillitis with significant cervical lymphadenopathy refractory      to antibiotic regimens consisting of Biaxin and Avelox.  Blood cultures      x2 negative.  2.  Hypokalemia undergoing repletion.  3.  Pain management.  4.  Asthma.  5.  Gastroesophageal reflux disease.  6.  Hypertension.   DISCHARGE MEDICATIONS:  1.  Lasix 40 mg p.o. daily p.r.n.  2.  Accupril 40 mg p.o. daily.  3.  Advair 250/50 one puff p.o. b.i.d.  4.  Albuterol metered dose inhaler p.r.n.  5.  Norvasc 5 mg each day.  6.  Zocor 40 mg each day.  7.  Effexor XR 75 mg each day.  8.  Darvocet p.r.n.  9.  Ranitidine 300 mg p.o. q.h.s.  10. Nexium 40 mg p.o. b.i.d.  11. Singulair 10 mg p.o. daily.  12. Alavert 10 mg p.o. daily.  13. Avelox 400 mg p.o. daily x10 days.  14. Flagyl 500 mg p.o. t.i.d. x10 days.  15. K-Dur 20 mEq p.o. b.i.d. x4 days.   DISCHARGE LABORATORIES:  White blood cell count 12.6, hemoglobin 12.4,  hematocrit 38%, platelet count 306.  Sodium 141, potassium 3.2, BUN 5,  creatinine 1, glucose 91.  Blood cultures x2 from April 09, 2005 prior to  the initiation of IV Primaxin are negative to date.  Other pertinent  laboratory findings admission white blood cell count 14.9, hemoglobin 14.1,  neutrophil percentage 87%.  Admission BUN 16, creatinine 1.2 rising to 1.5.  Albumin 3.2, AST 44, ALT 34, alkaline phosphatase 168, total bilirubin 0.6.  CT of the sinuses and neck without contrast revealed no evidence of  sinusitis or focal fluid collection or inflammation, but enlarging bilateral  cervical  lymph nodes left greater than right that are nonspecific with the  left side measuring 2.2 x 1.8 cm and the right measuring 2 x 1.3 cm.  Tonsillar thickening prominence bilaterally slightly increased since prior  study of April 08, 2004.  Difficult to exclude focal mass or abscess and  stable 2.3 x 2.2 cm left thyroid lesion.   HISTORY OF PRESENT ILLNESS:  This is a 55 year old Caucasian female who has  a history of chronic sinusitis, seasonal allergic rhinitis, asthma,  gastroesophageal reflux disease, and history of requirement for stent  placement during emergency sinus surgery 2005, and a history of cervical  lymph node biopsy.  She is followed by Dr. Laurette Schimke, Dr. Suzanna Obey, as  well as myself.  She presented on February 02, 2005 with fever of 100.8  degrees Fahrenheit, cervical lymphadenopathy with significant pharyngitis  and tonsillitis and a leukocytosis of 16.9 with negative Strep test.  She  was treated empirically with Biaxin XL given her multiple antibiotic  sensitivities.  Over the next two to three days she progressed with  continued fever, anorexia, odynophagia, nausea with dry heaves, and no  clinical improvement and she was subsequently admitted on April 07, 2005  for IV Avelox and pain management.  Please see my history and physical for  significant details.   HOSPITAL COURSE:  Over the next one to two days the patient progressed with  continued high fevers and no relief and she was subsequently switched over  to IV Primaxin for anaerobic coverage given her sensitivities to  clindamycin.  After getting one dose of steroids as well as switching over  to IV Primaxin she quickly improved from a clinical basis with improved  fever curve, decreased white blood cell count, and decreased cervical  lymphadenopathy with improved odynophagia.  Prior to discharge she was  eating well, was afebrile for 48 hours, and no longer was suffering from  nausea and vomiting.    Patient did have some issues with two to three episodes of mucousy stools.  C. difficile toxin was obtained and is pending at this time.  Given the need  for anaerobic coverage on discharge she will continue Flagyl on an  outpatient basis.  This was discussed by phone with infectious disease  consultation and they did agree with discharge on oral Avelox as well as  oral Flagyl for a 7-10 day regimen with close follow-up by myself as well as  Dr. Jearld Fenton.  Dr. Lazarus Salines did consult during this hospitalization and agreed  with the consideration for outpatient tonsillectomy given the severity of  her symptoms but this will be at the discretion of Dr. Jearld Fenton upon follow-up.   Patient was deemed appropriate for discharge on April 12, 2005 and she  knows to call her office immediately if her symptoms progress or worsen on  her oral regimen.      Larina Earthly, M.D.  Electronically Signed     RA/MEDQ  D:  04/12/2005  T:  04/12/2005  Job:  [id number]   cc:   Suzanna Obey, M.D.  Fax: [phone]   Jessica Priest, M.D.  Fax: [phone]

## 2010-10-29 NOTE — Op Note (Signed)
[name], [name]              ACCOUNT NO.:  [id number]   MEDICAL RECORD NO.:  [id number]          PATIENT TYPE:  AMB   LOCATION:  ENDO                         FACILITY:  [facility]   PHYSICIAN:  Bernette Redbird, M.D.   DATE OF BIRTH:  May 28, 1956   DATE OF PROCEDURE:  10/13/2005  DATE OF DISCHARGE:                                 OPERATIVE REPORT   PROCEDURE:  Colonoscopy.   INDICATION:  Transiently Hemoccult positive stool and need for initial colon  cancer screening exam in a 55 year old female with a family history of colon  polyps in both parents.   FINDINGS:  Mild sigmoid diverticulosis.  Moderate fixation.   PROCEDURE:  The nature, purpose and risks of the procedure had been  discussed with the patient, who provided written consent.  Sedation prior to  and during the course of this exam and the upper endoscopy which preceded it  totaled Phenergan 25 mg, fentanyl 100 mcg and Versed 10 mg IV without  arrhythmias or desaturation.  The Olympus adjustable-tension pediatric video  colonoscope was advanced with moderate difficulty through the sigmoid  region, which had quite a bit of fixation.  There was also some looping,  overcome by external abdominal compression and having the patient in the  supine position.  Ultimately we reached the cecum as identified by  visualization of the appendiceal orifice and ileocecal valve.  Pullback was  then performed.  The quality of the prep was quite good, although there were  some small puddles of stool that had particulate matter that would clog the  scope.  In a few cases these polyps could not be suctioned due to recurrent  clogging, but it is not felt that any significant lesions would have been  missed and by and large, the vast majority of the colonic mucosa was very  well-visualized.   There was some sigmoid diverticular change in the sigmoid region and, as  mentioned, quite a bit of sigmoid fixation.  This was otherwise, however, a  normal examination, without evidence of polyps, cancer, colitis or vascular  malformations.  Retroflexion in the rectum was normal and reinspection of  the rectum was normal except for some mucosal hemorrhage from the  retroflexion (possibly some minimal friability present).   No biopsies were obtained.  The patient tolerated the procedure well, and  there no apparent complications.   IMPRESSION:  1.  Transiently Hemoccult positive stool, without source evident on current      examination (792.1).  2.  Sigmoid diverticulosis, mild.  3.  Sigmoid fixation, possibly related to previous cholecystectomy and      hysterectomy procedures.   PLAN:  Repeat colonoscopy in 5 years in view of the family history of colon  polyps in both parents.           ______________________________  Bernette Redbird, M.D.     RB/MEDQ  D:  10/13/2005  T:  10/14/2005  Job:  [id number]   cc:   Larina Earthly, M.D.  Fax: [phone]

## 2010-10-29 NOTE — H&P (Signed)
NAMEJALAYIAH, [name]              ACCOUNT NO.:  [id number]   MEDICAL RECORD NO.:  [id number]          PATIENT TYPE:  INP   LOCATION:  A302                          FACILITY:  [facility]   PHYSICIAN:  Hanley Hays. Dechurch, M.D.DATE OF BIRTH:  [birth year]   DATE OF ADMISSION:  02/08/2006  DATE OF DISCHARGE:  LH                                HISTORY & PHYSICAL   HISTORY OF PRESENT ILLNESS:  The patient is a 55 year old Caucasian female  who has diabetes mellitus, gastroesophageal reflux, and a history of sinus  problems, but otherwise enjoys reasonable health, who was in her usual state  of health until early this morning when she was awakened with acute onset of  nausea, vomiting, and diarrhea which has been relentless.  She presented to  the emergency room this afternoon where she was initially noted to be  hypotensive with a blood pressure of 87/50, temperature 99, and tachycardic  with a rate of 112, and continued with copious, brown, watery diarrhea and  frequent episodes of vomiting described as bilious in nature.  She  complained of some bloating, but no true abdominal pain.  She is accompanied  by her husband who has not been ill.  They ate the same thing for dinner  last night.  She ate chicken and biscuits at [employer] in  [place] for lunch yesterday.  She felt quite well prior to being awakened  this morning.  In any event, she has a white count of 22,000, with a left  shift.  Mild elevation of OT and PT at 42 and 41, respectively, with normal  alkaline phosphatase and bilirubin.  The patient actually had elevated LFTs  in the past with review of the records to about this degree.  In any event,  she is being admitted to the hospital for control of her nausea and  vomiting, IV fluid resuscitation, and monitoring.   PAST MEDICAL HISTORY:  1. Splenectomy secondary to a splenic artery aneurysm.  2. History of cholecystectomy.  3. Hysterectomy.  4. Gastroesophageal  reflux.  5. Hiatal hernia.  6. Irritable bowel syndrome.  7. Hyperlipidemia.  8. Depression with stress disorder.  9. Hypertension.  10.Diabetes mellitus (diagnosed about four months prior).  11.Seasonal allergic rhinitis.  12.Chronic sinusitis with history of multiple sinus procedures.  13.NSAID induced anaphylaxis.  14.Asthma.  15.Peanut allergy.  16.Migraine headaches.  17.History of bilateral meniscal tears of her knees, status post bilateral      arthroscopy.  18.History of stents in her frontal, ethmoid sinuses.  19.History of tonsillectomy secondary to recurrent severe tonsillitis      requiring hospitalization.   MEDICATIONS:  1. Effexor XR 75 mg daily.  2. Zocor 40 mg daily.  3. Nexium 40 mg b.i.d.  4. Ranitidine 300 mg at bedtime.  5. Advair 250/50 b.i.d.  6. Albuterol MDI p.r.n.  7. Norvasc 5 mg daily.  8. Singular 10 mg daily.  9. Accupril 40 mg daily.  10.Lasix 40 mg p.r.n.   It should be noted these doses are taken from the hospital record.  The  patient does not have  her medications available, and is too sick to provide  accurate doses.   ALLERGIES:  1. ASPIRIN.  2. CEPHALOSPORINS.  3. CODEINE.  4. DEMEROL.  5. IBUPROFEN.  6. SULFONAMIDES.  7. CIPRO.  She has tolerated Avelox in the past.   SOCIAL HISTORY:  The patient is married.  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  No alcohol or tobacco abuse.   FAMILY HISTORY:  Noncontributory to this admission.  Father with colon  polyps, Padgett's disease, diabetes, and sleep apnea.  Apparently, family  history is strong for colon cancer.   REVIEW OF SYSTEMS:  As noted per the HPI.  The patient has colonoscopy and  endoscopy in May 2007.  Usually active.  Denies any headache.  Complains of  chills, but otherwise unremarkable.   PHYSICAL EXAMINATION:  GENERAL:  An ill-appearing female who is appropriate.  VITAL SIGNS:  Blood pressure after 2 L of IV fluids is 116/65, pulse of 110  and sinus  rhythm.  LUNGS:  Clear to auscultation.  HEART:  Regular rate and rhythm.  ABDOMEN:  Obese and soft with active bowel sounds.  Minimal examination is  done after her nausea settled.  HEENT:  Oropharynx is moist.  Teeth are in good repair.  She has no  significant adenopathy.  EXTREMITIES:  Without cyanosis, clubbing, or edema.  SKIN:  No skin rash or lesion.  NEUROLOGIC:  Intact.   ASSESSMENT AND PLAN:  1. Intractable nausea, vomiting, and diarrhea.  Possibly gastroenteritis.      Cannot rule out salmonellosis or other.  Her white count is a little      higher than I would expect for a pure gastroenteritis.  In any event,      she will be admitted to the hospital for intravenous fluid      resuscitation and monitoring.  I am going to begin empiric Flagyl and      Cipro until cultures are back or symptoms subside readily.  2. Diabetes mellitus.  Accu-Cheks during this hospital stay.  Recent      diagnosis.  No further workup is indicated.  3. Mild elevation of OT/PT.  Again, review of the records revealed this in      the past.  Likely fatty liver in this patient.  4. Chronic sinusitis.  Recently, CAT scan revealed some acute findings.      She states she has not had any antibiotics recently, though she has      been exposed to multiple antibiotics over time.  She is not symptomatic      at this time, or Cipro and Flagyl should cover any sinus issues if that      was a playing role.  5. History of anxiety disorder.  On Effexor.  Again, her p.o. medications      will be held until her nausea and vomiting subside.  6. History of non-steroidal anti-inflammatory drug anaphylaxis.  This      needs to be noted.      Hanley Hays Josefine Class, M.D.  Electronically Signed     FED/MEDQ  D:  02/08/2006  T:  02/08/2006  Job:  [id number]   cc:   Larina Earthly, M.D.  Fax: [phone]

## 2011-01-27 ENCOUNTER — Other Ambulatory Visit: Payer: Self-pay | Admitting: Gastroenterology

## 2011-01-28 ENCOUNTER — Ambulatory Visit
Admission: RE | Admit: 2011-01-28 | Discharge: 2011-01-28 | Disposition: A | Discharge status: Home / Self Care | Payer: 59 | Source: Ambulatory Visit | Attending: Gastroenterology | Admitting: Gastroenterology

## 2011-02-04 ENCOUNTER — Other Ambulatory Visit: Payer: Self-pay | Admitting: Obstetrics and Gynecology

## 2011-03-31 ENCOUNTER — Emergency Department (HOSPITAL_COMMUNITY)
Admission: EM | Admit: 2011-03-31 | Discharge: 2011-04-01 | Disposition: A | Discharge status: Home / Self Care | Payer: 59 | Attending: Emergency Medicine | Admitting: Emergency Medicine

## 2011-03-31 DIAGNOSIS — J029 Acute pharyngitis, unspecified: Secondary | ICD-10-CM | POA: Insufficient documentation

## 2011-03-31 DIAGNOSIS — R110 Nausea: Secondary | ICD-10-CM | POA: Insufficient documentation

## 2011-03-31 DIAGNOSIS — N390 Urinary tract infection, site not specified: Secondary | ICD-10-CM | POA: Insufficient documentation

## 2011-03-31 DIAGNOSIS — R1012 Left upper quadrant pain: Secondary | ICD-10-CM | POA: Insufficient documentation

## 2011-03-31 DIAGNOSIS — I10 Essential (primary) hypertension: Secondary | ICD-10-CM | POA: Insufficient documentation

## 2011-03-31 DIAGNOSIS — J3489 Other specified disorders of nose and nasal sinuses: Secondary | ICD-10-CM | POA: Insufficient documentation

## 2011-03-31 DIAGNOSIS — R1013 Epigastric pain: Secondary | ICD-10-CM | POA: Insufficient documentation

## 2011-03-31 DIAGNOSIS — R6883 Chills (without fever): Secondary | ICD-10-CM | POA: Insufficient documentation

## 2011-03-31 DIAGNOSIS — E119 Type 2 diabetes mellitus without complications: Secondary | ICD-10-CM | POA: Insufficient documentation

## 2011-03-31 DIAGNOSIS — E785 Hyperlipidemia, unspecified: Secondary | ICD-10-CM | POA: Insufficient documentation

## 2011-03-31 DIAGNOSIS — R05 Cough: Secondary | ICD-10-CM | POA: Insufficient documentation

## 2011-03-31 DIAGNOSIS — R059 Cough, unspecified: Secondary | ICD-10-CM | POA: Insufficient documentation

## 2011-03-31 DIAGNOSIS — R1031 Right lower quadrant pain: Secondary | ICD-10-CM | POA: Insufficient documentation

## 2011-03-31 DIAGNOSIS — R1011 Right upper quadrant pain: Secondary | ICD-10-CM | POA: Insufficient documentation

## 2011-03-31 LAB — URINALYSIS, ROUTINE W REFLEX MICROSCOPIC
Glucose, UA: NEGATIVE mg/dL
Ketones, ur: NEGATIVE mg/dL
Specific Gravity, Urine: 1.026 (ref 1.005–1.030)
pH: 5.5 (ref 5.0–8.0)

## 2011-03-31 LAB — CBC
HCT: 39.3 % (ref 36.0–46.0)
Hemoglobin: 13 g/dL (ref 12.0–15.0)
RDW: 13.8 % (ref 11.5–15.5)
WBC: 12 10*3/uL — ABNORMAL HIGH (ref 4.0–10.5)

## 2011-03-31 LAB — URINE MICROSCOPIC-ADD ON

## 2011-03-31 LAB — DIFFERENTIAL
Basophils Absolute: 0.1 10*3/uL (ref 0.0–0.1)
Basophils Relative: 1 % (ref 0–1)
Eosinophils Relative: 2 % (ref 0–5)
Lymphocytes Relative: 41 % (ref 12–46)
Neutro Abs: 5.3 10*3/uL (ref 1.7–7.7)

## 2011-03-31 LAB — COMPREHENSIVE METABOLIC PANEL
ALT: 20 U/L (ref 0–35)
AST: 18 U/L (ref 0–37)
Albumin: 3.7 g/dL (ref 3.5–5.2)
Alkaline Phosphatase: 78 U/L (ref 39–117)
BUN: 14 mg/dL (ref 6–23)
Chloride: 103 mEq/L (ref 96–112)
Potassium: 3.6 mEq/L (ref 3.5–5.1)
Sodium: 141 mEq/L (ref 135–145)
Total Bilirubin: 0.4 mg/dL (ref 0.3–1.2)

## 2011-03-31 LAB — LIPASE, BLOOD: Lipase: 45 U/L (ref 11–59)

## 2011-04-01 LAB — URINE CULTURE: Culture  Setup Time: 201210190049

## 2011-06-14 DIAGNOSIS — G43909 Migraine, unspecified, not intractable, without status migrainosus: Secondary | ICD-10-CM

## 2011-06-14 HISTORY — DX: Migraine, unspecified, not intractable, without status migrainosus: G43.909

## 2011-06-16 ENCOUNTER — Other Ambulatory Visit: Payer: Self-pay | Admitting: Obstetrics and Gynecology

## 2011-06-16 DIAGNOSIS — R1031 Right lower quadrant pain: Secondary | ICD-10-CM

## 2011-06-20 ENCOUNTER — Ambulatory Visit
Admission: RE | Admit: 2011-06-20 | Discharge: 2011-06-20 | Disposition: A | Discharge status: Home / Self Care | Payer: 59 | Source: Ambulatory Visit | Attending: Obstetrics and Gynecology | Admitting: Obstetrics and Gynecology

## 2011-06-20 DIAGNOSIS — R1031 Right lower quadrant pain: Secondary | ICD-10-CM

## 2011-07-22 ENCOUNTER — Other Ambulatory Visit: Payer: Self-pay | Admitting: Gastroenterology

## 2011-07-22 ENCOUNTER — Ambulatory Visit
Admission: RE | Admit: 2011-07-22 | Discharge: 2011-07-22 | Disposition: A | Discharge status: Home / Self Care | Payer: 59 | Source: Ambulatory Visit | Attending: Gastroenterology | Admitting: Gastroenterology

## 2011-07-22 DIAGNOSIS — R1031 Right lower quadrant pain: Secondary | ICD-10-CM

## 2011-09-13 ENCOUNTER — Other Ambulatory Visit (HOSPITAL_COMMUNITY): Payer: Self-pay | Admitting: Obstetrics and Gynecology

## 2011-09-13 DIAGNOSIS — Z139 Encounter for screening, unspecified: Secondary | ICD-10-CM

## 2011-09-19 ENCOUNTER — Ambulatory Visit (HOSPITAL_COMMUNITY)
Admission: RE | Admit: 2011-09-19 | Discharge: 2011-09-19 | Disposition: A | Discharge status: Home / Self Care | Payer: 59 | Source: Ambulatory Visit | Attending: Obstetrics and Gynecology | Admitting: Obstetrics and Gynecology

## 2011-09-19 DIAGNOSIS — Z1231 Encounter for screening mammogram for malignant neoplasm of breast: Secondary | ICD-10-CM | POA: Insufficient documentation

## 2011-09-19 DIAGNOSIS — Z139 Encounter for screening, unspecified: Secondary | ICD-10-CM

## 2011-09-19 DIAGNOSIS — N6459 Other signs and symptoms in breast: Secondary | ICD-10-CM | POA: Insufficient documentation

## 2011-09-21 ENCOUNTER — Other Ambulatory Visit: Payer: Self-pay | Admitting: Obstetrics and Gynecology

## 2011-09-21 DIAGNOSIS — R928 Other abnormal and inconclusive findings on diagnostic imaging of breast: Secondary | ICD-10-CM

## 2011-09-22 ENCOUNTER — Ambulatory Visit
Admission: RE | Admit: 2011-09-22 | Discharge: 2011-09-22 | Disposition: A | Discharge status: Home / Self Care | Payer: 59 | Source: Ambulatory Visit | Attending: Obstetrics and Gynecology | Admitting: Obstetrics and Gynecology

## 2011-09-22 DIAGNOSIS — R928 Other abnormal and inconclusive findings on diagnostic imaging of breast: Secondary | ICD-10-CM

## 2011-12-01 ENCOUNTER — Other Ambulatory Visit: Payer: Self-pay | Admitting: Otolaryngology

## 2011-12-01 DIAGNOSIS — D34 Benign neoplasm of thyroid gland: Secondary | ICD-10-CM

## 2011-12-02 ENCOUNTER — Ambulatory Visit
Admission: RE | Admit: 2011-12-02 | Discharge: 2011-12-02 | Disposition: A | Discharge status: Home / Self Care | Payer: 59 | Source: Ambulatory Visit | Attending: Otolaryngology | Admitting: Otolaryngology

## 2011-12-02 DIAGNOSIS — D34 Benign neoplasm of thyroid gland: Secondary | ICD-10-CM

## 2012-02-18 ENCOUNTER — Encounter (HOSPITAL_COMMUNITY): Payer: Self-pay | Admitting: Emergency Medicine

## 2012-02-18 ENCOUNTER — Emergency Department (HOSPITAL_COMMUNITY)
Admission: EM | Admit: 2012-02-18 | Discharge: 2012-02-18 | Disposition: A | Discharge status: Home / Self Care | Payer: 59 | Source: Home / Self Care

## 2012-02-18 DIAGNOSIS — N390 Urinary tract infection, site not specified: Secondary | ICD-10-CM

## 2012-02-18 DIAGNOSIS — N760 Acute vaginitis: Secondary | ICD-10-CM

## 2012-02-18 HISTORY — DX: Essential (primary) hypertension: I10

## 2012-02-18 LAB — WET PREP, GENITAL: Trich, Wet Prep: NONE SEEN

## 2012-02-18 LAB — POCT URINALYSIS DIP (DEVICE)
Protein, ur: NEGATIVE mg/dL
Specific Gravity, Urine: 1.015 (ref 1.005–1.030)
Urobilinogen, UA: 0.2 mg/dL (ref 0.0–1.0)

## 2012-02-18 MED ORDER — CIPROFLOXACIN HCL 500 MG PO TABS: 500.0000 mg | ORAL_TABLET | Freq: Two times a day (BID) | ORAL | Status: AC

## 2012-02-18 NOTE — ED Provider Notes (Signed)
History     CSN: [id number]  Arrival date & time 02/18/12  1037   First MD Initiated Contact with Patient 02/18/12 1049      Chief Complaint  Patient presents with  . Vaginal Discharge    right lower abdl. pain    (Consider location/radiation/quality/duration/timing/severity/associated sxs/prior treatment) HPI Comments: Suprapubic discomfort, mild transient nausea and scant evidence of vaginal discharge on underclothing. She also has chronic urinary burning. No  Frequency. She has hx of diverticulosis but no known itis.    The history is provided by the patient.    Past Medical History  Diagnosis Date  . Diabetes mellitus   . Hypertension     Past Surgical History  Procedure Date  . Abdominal hysterectomy   . Gallbladder surgery   . Lymph nodectomy   . Splenic artery embolization     Family History  Problem Relation Age of Onset  . Diabetes Other   . Hypertension Other     History  Substance Use Topics  . Smoking status: Never Smoker   . Smokeless tobacco: Not on file  . Alcohol Use: No    OB History    Grav Para Term Preterm Abortions TAB SAB Ect Mult Living                  Review of Systems  Constitutional: Negative for fever, activity change and fatigue.  Respiratory: Negative.   Cardiovascular: Negative.   Gastrointestinal: Positive for nausea. Negative for vomiting and blood in stool.  Genitourinary: Positive for dysuria, vaginal discharge and pelvic pain. Negative for hematuria and flank pain.  Musculoskeletal: Negative for myalgias and back pain.  Skin: Negative.   Neurological: Negative.   Psychiatric/Behavioral: Negative.     Allergies  Aspirin; Cephalosporins; Codeine; Ibuprofen; Iohexol; Meperidine hcl; Penicillins; and Sulfonamide derivatives  Home Medications   Current Outpatient Rx  Name Route Sig Dispense Refill  . CIPROFLOXACIN HCL 500 MG PO TABS Oral Take 1 tablet (500 mg total) by mouth 2 (two) times daily. 20 tablet 0     BP 129/65  Pulse 76  Temp 97.7 F (36.5 C) (Oral)  Resp 12  SpO2 100%  Physical Exam  Constitutional: She is oriented to person, place, and time. She appears well-developed and well-nourished. No distress.  Neck: Normal range of motion. Neck supple.  Abdominal: Soft. She exhibits no distension. There is no tenderness. There is no rebound.  Genitourinary: No vaginal discharge found.       Mild mucosal erythema of minor labia. No external discharge. Internal, no cervix but vaginal mucosa friable , moist. Minor tenderness.  Musculoskeletal: Normal range of motion. She exhibits no edema.  Neurological: She is alert and oriented to person, place, and time.  Skin: Skin is warm and dry.  Psychiatric: She has a normal mood and affect.    ED Course  Procedures (including critical care time)  Labs Reviewed  POCT URINALYSIS DIP (DEVICE) - Abnormal; Notable for the following:    Ketones, ur TRACE (*)     Hgb urine dipstick MODERATE (*)     Leukocytes, UA TRACE (*)  Biochemical Testing Only. Please order routine urinalysis from main lab if confirmatory testing is needed.   All other components within normal limits  WET PREP, GENITAL - Abnormal; Notable for the following:    Clue Cells Wet Prep HPF POC RARE (*)     WBC, Wet Prep HPF POC FEW (*)     All other components within normal limits  GC/CHLAMYDIA PROBE AMP, GENITAL   No results found.   1. UTI (lower urinary tract infection)   2. Vaginitis       MDM  Femstat as directed, possible yeast involvement.  cipro 500 bid for 10 d. Plenty of fluids        Hayden Rasmussen, NP 02/18/12 2130

## 2012-02-18 NOTE — ED Provider Notes (Signed)
Medical screening examination/treatment/procedure(s) were performed by non-physician practitioner and as supervising physician I was immediately available for consultation/collaboration.  Ebon Ketchum, M.D.   Amara Manalang C Miyuki Rzasa, MD 02/18/12 2144 

## 2012-02-18 NOTE — ED Notes (Signed)
Waiting for discharge papers

## 2012-02-18 NOTE — ED Notes (Signed)
Pt c/o right lower pelvic pain with greenish d/c/odor x 1wk ago but odor has subsided. Hx of diverticulitis. Pt has experienced some nausea.

## 2012-02-21 ENCOUNTER — Telehealth (HOSPITAL_COMMUNITY): Payer: Self-pay | Admitting: *Deleted

## 2012-02-21 LAB — GC/CHLAMYDIA PROBE AMP, GENITAL: GC Probe Amp, Genital: NEGATIVE

## 2012-02-21 NOTE — ED Notes (Signed)
Pt. called on VM 9/9 and said she had a call from here on Sat. @ 1630 from the RN but no message. I called back.  She thinks they were trying to call her the Wet prep result.  Pt. verified x 2 and given results.  (GC/Chlamydia neg., Wet prep: rare clue cells, few WBC's.) Pt. treated with Cipro for UTI.  No urine culture pending. Vassie Moselle 02/21/2012

## 2012-05-24 DIAGNOSIS — E119 Type 2 diabetes mellitus without complications: Secondary | ICD-10-CM

## 2012-08-02 DIAGNOSIS — G4752 REM sleep behavior disorder: Secondary | ICD-10-CM | POA: Insufficient documentation

## 2012-11-07 DIAGNOSIS — J45990 Exercise induced bronchospasm: Secondary | ICD-10-CM | POA: Insufficient documentation

## 2012-11-07 DIAGNOSIS — G43909 Migraine, unspecified, not intractable, without status migrainosus: Secondary | ICD-10-CM | POA: Insufficient documentation

## 2013-02-26 ENCOUNTER — Other Ambulatory Visit: Payer: Self-pay

## 2013-02-26 DIAGNOSIS — Z0000 Encounter for general adult medical examination without abnormal findings: Secondary | ICD-10-CM

## 2013-03-11 ENCOUNTER — Ambulatory Visit
Admission: RE | Admit: 2013-03-11 | Discharge: 2013-03-11 | Disposition: A | Discharge status: Home / Self Care | Payer: 59 | Source: Ambulatory Visit

## 2013-03-11 DIAGNOSIS — Z0000 Encounter for general adult medical examination without abnormal findings: Secondary | ICD-10-CM

## 2013-03-13 ENCOUNTER — Other Ambulatory Visit: Payer: Self-pay | Admitting: Dermatology

## 2013-08-30 ENCOUNTER — Encounter (HOSPITAL_COMMUNITY): Payer: Self-pay | Admitting: Emergency Medicine

## 2013-08-30 ENCOUNTER — Emergency Department (HOSPITAL_COMMUNITY): Payer: 59

## 2013-08-30 DIAGNOSIS — E876 Hypokalemia: Secondary | ICD-10-CM | POA: Insufficient documentation

## 2013-08-30 DIAGNOSIS — E785 Hyperlipidemia, unspecified: Secondary | ICD-10-CM | POA: Insufficient documentation

## 2013-08-30 DIAGNOSIS — E119 Type 2 diabetes mellitus without complications: Secondary | ICD-10-CM | POA: Insufficient documentation

## 2013-08-30 DIAGNOSIS — R0789 Other chest pain: Principal | ICD-10-CM | POA: Insufficient documentation

## 2013-08-30 DIAGNOSIS — I10 Essential (primary) hypertension: Secondary | ICD-10-CM | POA: Insufficient documentation

## 2013-08-30 LAB — CBC
HCT: 38.9 % (ref 36.0–46.0)
Hemoglobin: 13 g/dL (ref 12.0–15.0)
MCH: 31.8 pg (ref 26.0–34.0)
MCHC: 33.4 g/dL (ref 30.0–36.0)
MCV: 95.1 fL (ref 78.0–100.0)
Platelets: 309 10*3/uL (ref 150–400)
RBC: 4.09 MIL/uL (ref 3.87–5.11)
RDW: 13.5 % (ref 11.5–15.5)
WBC: 10.7 10*3/uL — ABNORMAL HIGH (ref 4.0–10.5)

## 2013-08-30 LAB — BASIC METABOLIC PANEL
BUN: 14 mg/dL (ref 6–23)
CALCIUM: 9.7 mg/dL (ref 8.4–10.5)
CO2: 30 meq/L (ref 19–32)
CREATININE: 1.19 mg/dL — AB (ref 0.50–1.10)
Chloride: 99 mEq/L (ref 96–112)
GFR calc Af Amer: 56 mL/min — ABNORMAL LOW (ref >90)
GFR, EST NON AFRICAN AMERICAN: 49 mL/min — AB (ref >90)
Glucose, Bld: 175 mg/dL — ABNORMAL HIGH (ref 70–99)
Potassium: 3.3 mEq/L — ABNORMAL LOW (ref 3.7–5.3)
SODIUM: 142 meq/L (ref 137–147)

## 2013-08-30 NOTE — ED Notes (Signed)
C/o throbbing pain in center of chest that radiates to back.  States symptoms started last night but resolved and then started again today.  Denies sob, nausea, and vomiting.

## 2013-08-31 ENCOUNTER — Other Ambulatory Visit: Payer: Self-pay | Admitting: Physician Assistant

## 2013-08-31 ENCOUNTER — Encounter (HOSPITAL_COMMUNITY): Payer: Self-pay | Admitting: Internal Medicine

## 2013-08-31 ENCOUNTER — Observation Stay (HOSPITAL_COMMUNITY)
Admission: EM | Admit: 2013-08-31 | Discharge: 2013-08-31 | Disposition: A | Discharge status: Home / Self Care | Payer: 59 | Attending: Internal Medicine | Admitting: Internal Medicine

## 2013-08-31 DIAGNOSIS — E785 Hyperlipidemia, unspecified: Secondary | ICD-10-CM | POA: Diagnosis present

## 2013-08-31 DIAGNOSIS — K219 Gastro-esophageal reflux disease without esophagitis: Secondary | ICD-10-CM

## 2013-08-31 DIAGNOSIS — G4733 Obstructive sleep apnea (adult) (pediatric): Secondary | ICD-10-CM

## 2013-08-31 DIAGNOSIS — R079 Chest pain, unspecified: Secondary | ICD-10-CM

## 2013-08-31 DIAGNOSIS — K589 Irritable bowel syndrome without diarrhea: Secondary | ICD-10-CM

## 2013-08-31 DIAGNOSIS — E041 Nontoxic single thyroid nodule: Secondary | ICD-10-CM

## 2013-08-31 DIAGNOSIS — E119 Type 2 diabetes mellitus without complications: Secondary | ICD-10-CM | POA: Diagnosis present

## 2013-08-31 DIAGNOSIS — I10 Essential (primary) hypertension: Secondary | ICD-10-CM | POA: Diagnosis present

## 2013-08-31 HISTORY — DX: Hyperlipidemia, unspecified: E78.5

## 2013-08-31 LAB — I-STAT TROPONIN, ED: Troponin i, poc: 0 ng/mL (ref 0.00–0.08)

## 2013-08-31 LAB — BASIC METABOLIC PANEL
BUN: 13 mg/dL (ref 6–23)
CO2: 28 mEq/L (ref 19–32)
Calcium: 9.3 mg/dL (ref 8.4–10.5)
Chloride: 102 mEq/L (ref 96–112)
Creatinine, Ser: 0.92 mg/dL (ref 0.50–1.10)
GFR calc Af Amer: 77 mL/min — ABNORMAL LOW (ref >90)
GFR, EST NON AFRICAN AMERICAN: 66 mL/min — AB (ref >90)
GLUCOSE: 95 mg/dL (ref 70–99)
POTASSIUM: 3.4 meq/L — AB (ref 3.7–5.3)
Sodium: 143 mEq/L (ref 137–147)

## 2013-08-31 LAB — CBC
HCT: 37.2 % (ref 36.0–46.0)
Hemoglobin: 12.4 g/dL (ref 12.0–15.0)
MCH: 31.5 pg (ref 26.0–34.0)
MCHC: 33.3 g/dL (ref 30.0–36.0)
MCV: 94.4 fL (ref 78.0–100.0)
Platelets: 288 10*3/uL (ref 150–400)
RBC: 3.94 MIL/uL (ref 3.87–5.11)
RDW: 13.4 % (ref 11.5–15.5)
WBC: 8.2 10*3/uL (ref 4.0–10.5)

## 2013-08-31 LAB — D-DIMER, QUANTITATIVE: D-Dimer, Quant: 0.46 ug/mL-FEU (ref 0.00–0.48)

## 2013-08-31 LAB — GLUCOSE, CAPILLARY: Glucose-Capillary: 88 mg/dL (ref 70–99)

## 2013-08-31 MED ORDER — LISINOPRIL 40 MG PO TABS
40.0000 mg | ORAL_TABLET | Freq: Every day | ORAL | Status: DC
  Administered 2013-08-31: 40 mg via ORAL
  Filled 2013-08-31: qty 1

## 2013-08-31 MED ORDER — QUINAPRIL HCL 10 MG PO TABS: 40.0000 mg | ORAL_TABLET | Freq: Every day | ORAL | Status: DC

## 2013-08-31 MED ORDER — HYDROMORPHONE HCL PF 1 MG/ML IJ SOLN: 0.5000 mg | INTRAMUSCULAR | Status: DC | PRN

## 2013-08-31 MED ORDER — ADULT MULTIVITAMIN W/MINERALS CH
1.0000 | ORAL_TABLET | Freq: Every day | ORAL | Status: DC
  Administered 2013-08-31: 1 via ORAL
  Filled 2013-08-31: qty 1

## 2013-08-31 MED ORDER — ACETAMINOPHEN 650 MG RE SUPP: 650.0000 mg | Freq: Four times a day (QID) | RECTAL | Status: DC | PRN

## 2013-08-31 MED ORDER — METHOCARBAMOL 500 MG PO TABS
500.0000 mg | ORAL_TABLET | Freq: Three times a day (TID) | ORAL | Status: DC | PRN
  Filled 2013-08-31: qty 1

## 2013-08-31 MED ORDER — ACETAMINOPHEN 325 MG PO TABS
650.0000 mg | ORAL_TABLET | Freq: Four times a day (QID) | ORAL | Status: DC | PRN
  Administered 2013-08-31: 650 mg via ORAL
  Filled 2013-08-31: qty 2

## 2013-08-31 MED ORDER — ONDANSETRON HCL 4 MG/2ML IJ SOLN
4.0000 mg | Freq: Once | INTRAMUSCULAR | Status: AC
  Administered 2013-08-31: 4 mg via INTRAVENOUS
  Filled 2013-08-31: qty 2

## 2013-08-31 MED ORDER — AMLODIPINE BESYLATE 5 MG PO TABS
5.0000 mg | ORAL_TABLET | Freq: Every day | ORAL | Status: DC
  Administered 2013-08-31: 5 mg via ORAL
  Filled 2013-08-31: qty 1

## 2013-08-31 MED ORDER — SODIUM CHLORIDE 0.9 % IV SOLN: 250.0000 mL | INTRAVENOUS | Status: DC | PRN

## 2013-08-31 MED ORDER — ONDANSETRON HCL 4 MG/2ML IJ SOLN: 4.0000 mg | Freq: Four times a day (QID) | INTRAMUSCULAR | Status: DC | PRN

## 2013-08-31 MED ORDER — NITROGLYCERIN 0.4 MG SL SUBL
0.4000 mg | SUBLINGUAL_TABLET | SUBLINGUAL | Status: AC | PRN
  Administered 2013-08-31 (×3): 0.4 mg via SUBLINGUAL

## 2013-08-31 MED ORDER — ALUM & MAG HYDROXIDE-SIMETH 200-200-20 MG/5ML PO SUSP: 30.0000 mL | Freq: Four times a day (QID) | ORAL | Status: DC | PRN

## 2013-08-31 MED ORDER — FUROSEMIDE 40 MG PO TABS
40.0000 mg | ORAL_TABLET | Freq: Every day | ORAL | Status: DC
  Administered 2013-08-31: 40 mg via ORAL
  Filled 2013-08-31: qty 1

## 2013-08-31 MED ORDER — ONDANSETRON HCL 4 MG PO TABS: 4.0000 mg | ORAL_TABLET | Freq: Four times a day (QID) | ORAL | Status: DC | PRN

## 2013-08-31 MED ORDER — SODIUM CHLORIDE 0.9 % IJ SOLN: 3.0000 mL | INTRAMUSCULAR | Status: DC | PRN

## 2013-08-31 MED ORDER — MORPHINE SULFATE 2 MG/ML IJ SOLN
2.0000 mg | Freq: Once | INTRAMUSCULAR | Status: AC
Start: 2013-08-31 — End: 2013-08-31
  Administered 2013-08-31: 2 mg via INTRAVENOUS
  Filled 2013-08-31: qty 1

## 2013-08-31 MED ORDER — POTASSIUM CHLORIDE CRYS ER 20 MEQ PO TBCR
40.0000 meq | EXTENDED_RELEASE_TABLET | Freq: Once | ORAL | Status: AC
  Administered 2013-08-31: 40 meq via ORAL
  Filled 2013-08-31: qty 2

## 2013-08-31 MED ORDER — SODIUM CHLORIDE 0.9 % IJ SOLN: 3.0000 mL | Freq: Two times a day (BID) | INTRAMUSCULAR | Status: DC

## 2013-08-31 MED ORDER — NITROGLYCERIN 2 % TD OINT
1.0000 [in_us] | TOPICAL_OINTMENT | Freq: Four times a day (QID) | TRANSDERMAL | Status: DC
  Administered 2013-08-31: 1 [in_us] via TOPICAL
  Filled 2013-08-31: qty 1

## 2013-08-31 MED ORDER — ATORVASTATIN CALCIUM 40 MG PO TABS
40.0000 mg | ORAL_TABLET | Freq: Every day | ORAL | Status: DC
  Filled 2013-08-31: qty 1

## 2013-08-31 MED ORDER — OXYCODONE HCL 5 MG PO TABS: 5.0000 mg | ORAL_TABLET | ORAL | Status: DC | PRN

## 2013-08-31 MED ORDER — ENOXAPARIN SODIUM 40 MG/0.4ML ~~LOC~~ SOLN
40.0000 mg | Freq: Every day | SUBCUTANEOUS | Status: DC
  Filled 2013-08-31: qty 0.4

## 2013-08-31 MED ORDER — CLONAZEPAM 0.5 MG PO TABS: 0.5000 mg | ORAL_TABLET | Freq: Every day | ORAL | Status: DC

## 2013-08-31 NOTE — Discharge Instructions (Signed)
Follow with Primary MD Tivis Ringer, MD in 7 days   Get CBC, CMP, checked 7 days by Primary MD and again as instructed by your Primary MD. Get a 2 view Chest X ray done next visit .   Activity: As tolerated with Full fall precautions use walker/cane & assistance as needed   Disposition Home     Diet: Heart Healthy Low carb  For Heart failure patients - Check your Weight same time everyday, if you gain over 2 pounds, or you develop in leg swelling, experience more shortness of breath or chest pain, call your Primary MD immediately. Follow Cardiac Low Salt Diet and 1.8 lit/day fluid restriction.   On your next visit with her primary care physician please Get Medicines reviewed and adjusted.  Please request your Prim.MD to go over all Hospital Tests and Procedure/Radiological results at the follow up, please get all Hospital records sent to your Prim MD by signing hospital release before you go home.   If you experience worsening of your admission symptoms, develop shortness of breath, life threatening emergency, suicidal or homicidal thoughts you must seek medical attention immediately by calling 911 or calling your MD immediately  if symptoms less severe.  You Must read complete instructions/literature along with all the possible adverse reactions/side effects for all the Medicines you take and that have been prescribed to you. Take any new Medicines after you have completely understood and accpet all the possible adverse reactions/side effects.   Do not drive and provide baby sitting services if your were admitted for syncope or siezures until you have seen by Primary MD or a Neurologist and advised to do so again.  Do not drive when taking Pain medications.    Do not take more than prescribed Pain, Sleep and Anxiety Medications  Special Instructions: If you have smoked or chewed Tobacco  in the last 2 yrs please stop smoking, stop any regular Alcohol  and or any Recreational  drug use.  Wear Seat belts while driving.   Please note  You were cared for by a hospitalist during your hospital stay. If you have any questions about your discharge medications or the care you received while you were in the hospital after you are discharged, you can call the unit and asked to speak with the hospitalist on call if the hospitalist that took care of you is not available. Once you are discharged, your primary care physician will handle any further medical issues. Please note that NO REFILLS for any discharge medications will be authorized once you are discharged, as it is imperative that you return to your primary care physician (or establish a relationship with a primary care physician if you do not have one) for your aftercare needs so that they can reassess your need for medications and monitor your lab values.

## 2013-08-31 NOTE — H&P (Signed)
[facility] History and Physical  XAVIER FOURNIER WCH:[id number] DOB: [birth year] DOA: 08/31/2013  Referring physician:   EDP PCP: Tivis Ringer, MD  Specialists:   Chief Complaint: Chest Pain  HPI: [name] is a 58 y.o. female with a history of DM2, HTN, and Hyperlipidemia who presents to the ED with complaints of intermittent chest pain x 2 days.   The pian began on Thursday night at about 10 pm while she was eating a cookie, and she began to have 8-9/10 mid chest pain that radiated into her back.   The pain was sharp and she had associated SOB with the episode.  The first episode lasted 4 hours, and returned in the AM and continued to worsen.   In the ED the pain began to resolve after 1 NTG had been given.   The initial ED workup was negative and she was referred for medical admission.       Of note, patient reports that she had a negative cardiac workup and stress test  by Cards Dr. Verl Blalock 5 years  ago on   07/2009.       Review of Systems:  Constitutional: No Weight Loss, No Weight Gain, Night Sweats, Fevers, Chills, Fatigue, or Generalized Weakness HEENT: No Headaches, Difficulty Swallowing,Tooth/Dental Problems,Sore Throat,  No Sneezing, Rhinitis, Ear Ache, Nasal Congestion, or Post Nasal Drip,  Cardio-vascular:  +Chest pain, Orthopnea, PND, Edema in lower extremities, Anasarca, Dizziness, Palpitations  Resp: +Dyspnea, No DOE, No Productive Cough, No Non-Productive Cough, No Hemoptysis, No Change in Color of Mucus,  No Wheezing.    GI: No Heartburn, Indigestion, Abdominal Pain, Nausea, Vomiting, Diarrhea, Change in Bowel Habits,  Loss of Appetite  GU: No Dysuria, Change in Color of Urine, No Urgency or Frequency.  No flank pain.  Musculoskeletal: No Joint Pain or Swelling.  No Decreased Range of Motion. No Back Pain.  Neurologic: No Syncope, No Seizures, Muscle Weakness, Paresthesia, Vision Disturbance or Loss, No Diplopia, No Vertigo, No Difficulty Walking,  Skin: No  Rash or Lesions. Psych: No Change in Mood or Affect. No Depression or Anxiety. No Memory loss. No Confusion or Hallucinations   Past Medical History  Diagnosis Date  . Diabetes mellitus   . Hypertension   . Hyperlipidemia       Past Surgical History  Procedure Laterality Date  . Abdominal hysterectomy    . Gallbladder surgery    . Lymph nodectomy    . Splenic artery embolization      due to Aneurysm       Prior to Admission medications   Medication Sig Start Date End Date Taking? Authorizing Provider  amLODipine (NORVASC) 5 MG tablet Take 1 tablet by mouth daily. 08/26/13  Yes Historical Provider, MD  clonazePAM (KLONOPIN) 0.5 MG tablet Take 1 tablet by mouth at bedtime. For sleep 08/16/13  Yes Historical Provider, MD  CRESTOR 20 MG tablet Take 10 mg by mouth daily. 08/07/13  Yes Historical Provider, MD  furosemide (LASIX) 40 MG tablet Take 1 tablet by mouth daily. 08/07/13  Yes Historical Provider, MD  methocarbamol (ROBAXIN) 500 MG tablet Take 1 tablet by mouth 3 (three) times daily as needed. 06/05/13  Yes Historical Provider, MD  Multiple Vitamin (MULTIVITAMIN WITH MINERALS) TABS tablet Take 1 tablet by mouth daily.   Yes Historical Provider, MD  quinapril (ACCUPRIL) 40 MG tablet Take 1 tablet by mouth daily. 08/07/13  Yes Historical Provider, MD      Allergies  Allergen Reactions  . Aspirin  Anaphylaxis  . Ibuprofen Anaphylaxis  . Meperidine Hcl Anaphylaxis  . Nsaids Anaphylaxis  . Codeine Nausea And Vomiting  . Iohexol Other (See Comments)     Desc: ELEVATION OF B.P, PASSED OUT, PT. NEEDS PRE-MEDS. FOR IODINE CONTRAST   . Sulfonamide Derivatives Hives  . Tramadol Nausea And Vomiting  . Cephalosporins Rash  . Penicillins Rash     Social History:   Married,  works in [employer] for [employer]   reports that she has never smoked. She does not have any smokeless tobacco history on file. She reports that she does not drink alcohol. Her drug history is not on file.      Family History  Problem Relation Age of Onset  . Diabetes Father   . Hypertension Father        Physical Exam:  GEN:  Pleasant Well Nourished and Well Developed  58 y.o. Caucasian female  examined  and in no acute distress; cooperative with exam Filed Vitals:   08/31/13 0345 08/31/13 0415 08/31/13 0434 08/31/13 0441  BP: 117/76 123/78 123/85 109/78  Pulse: 73 73 80 79  Temp:      TempSrc:      Resp: 15 12 12 10   SpO2: 100% 94% 93% 95%   Blood pressure 109/78, pulse 79, temperature 98.4 F (36.9 C), temperature source Oral, resp. rate 10, SpO2 95.00%. PSYCH: She is alert and oriented x4; does not appear anxious does not appear depressed; affect is normal HEENT: Normocephalic and Atraumatic, Mucous membranes pink; PERRLA; EOM intact; Fundi:  Benign;  No scleral icterus, Nares: Patent, Oropharynx: Clear, Fair Dentition, Neck:  FROM, no cervical lymphadenopathy nor thyromegaly or carotid bruit; no JVD; Breasts:: Not examined CHEST WALL: No tenderness CHEST: Normal respiration, clear to auscultation bilaterally HEART: Regular rate and rhythm; no murmurs rubs or gallops BACK: No kyphosis or scoliosis; no CVA tenderness ABDOMEN: Positive Bowel Sounds, soft non-tender; no masses, no organomegaly. Rectal Exam: Not done EXTREMITIES: No cyanosis, clubbing or edema; no ulcerations. Genitalia: not examined PULSES: 2+ and symmetric SKIN: Normal hydration no rash or ulceration CNS:  Alert and Oriented x 4, No Focal Deficits.    Vascular: pulses palpable throughout    Labs on Admission:  Basic Metabolic Panel:  Recent Labs Lab 08/30/13 2229  NA 142  K 3.3*  CL 99  CO2 30  GLUCOSE 175*  BUN 14  CREATININE 1.19*  CALCIUM 9.7   Liver Function Tests: No results found for this basename: AST, ALT, ALKPHOS, BILITOT, PROT, ALBUMIN,  in the last 168 hours No results found for this basename: LIPASE, AMYLASE,  in the last 168 hours No results found for this basename: AMMONIA,  in  the last 168 hours CBC:  Recent Labs Lab 08/30/13 2229  WBC 10.7*  HGB 13.0  HCT 38.9  MCV 95.1  PLT 309   Cardiac Enzymes: No results found for this basename: CKTOTAL, CKMB, CKMBINDEX, TROPONINI,  in the last 168 hours  BNP (last 3 results) No results found for this basename: PROBNP,  in the last 8760 hours CBG: No results found for this basename: GLUCAP,  in the last 168 hours  Radiological Exams on Admission: Dg Chest 2 View  08/30/2013   CLINICAL DATA:  Mid chest pain radiating into the back which began yesterday. Current history of hypertension and diabetes.  EXAM: CHEST  2 VIEW  COMPARISON:  Two-view chest x-ray 07/19/2009, 08/31/2008, 07/05/2005. CTA chest 08/31/2008.  FINDINGS: Cardiomediastinal silhouette unremarkable and unchanged. Lungs clear. Bronchovascular markings normal.  Pulmonary vascularity normal. No visible pleural effusions. No pneumothorax. Visualized bony thorax intact. Surgical clips in the left upper quadrant from prior splenectomy. Surgical clips in the right upper quadrant from prior cholecystectomy.  IMPRESSION: No acute cardiopulmonary disease. Stable examination.   Electronically Signed   By: Evangeline Dakin M.D.   On: 08/30/2013 23:23      EKG: Independently reviewed. Normal Sinus Rythm Rate 71, No Acute S-T changes.      Assessment/Plan:   58 y.o. female with  Principal Problem:   Chest pain Active Problems:   HYPERTENSION   DIABETES MELLITUS, TYPE II   HYPERLIPIDEMIA    1.   Chest Pain-   Telemetry Monitoring, cycle Troponins, Nitropaste, O2, No Aspirin due to ANAPHYLAXIS.     2.   HTN- continue Amlodipine, Furosemide, and Quinipril.   Monitor BPs.    3.    DM2- managed with Diet,  SSI coverage PRN, and check HbA1C.    4.    Hyperlipidemia- Continue Statin Rx, Check Fasting Lipids.        5.     DVT prophylaxis with Lovenox.       Code Status:   FULL CODE Family Communication:    Husband at the Bedside Disposition Plan:    Observation/ Telemetry Bed  Time spent:  [facility] Pager [phone]  If 7PM-7AM, please contact night-coverage [email or web address] Password [facility] 08/31/2013, 5:24 AM

## 2013-08-31 NOTE — Progress Notes (Signed)
Utilization Review Completed.Leandrea Ackley, Jerlisa T3/21/2015  

## 2013-08-31 NOTE — Progress Notes (Signed)
I have left a message at Dr. Rosezella [place] request on our office's scheduling voicemail requesting an ETT this week along with follow-up appointment, and our office will call the patient with these appointments. Meggin Ola PA-C

## 2013-08-31 NOTE — ED Provider Notes (Signed)
CSN: [id number]     Arrival date & time 08/30/13  2219 History   First MD Initiated Contact with Patient 08/31/13 0308     Chief Complaint  Patient presents with  . Chest Pain     (Consider location/radiation/quality/duration/timing/severity/associated sxs/prior Treatment) HPI History provided by patient. Had some chest pain earlier yesterday the last a short period of time and resolved. Tonight at home chest pain returned, located substernal, throbbing in quality and moderate severity. Pain radiating to her back. Mild shortness of breath. No nausea vomiting. No diaphoresis. History of diabetes, hyperlipidemia and hypertension but no known history of coronary artery disease. Patient reports negative stress test a number of years ago with admission to the hospital at that time. No known aggravating or alleviating factors.  Past Medical History  Diagnosis Date  . Diabetes mellitus   . Hypertension    Past Surgical History  Procedure Laterality Date  . Abdominal hysterectomy    . Gallbladder surgery    . Lymph nodectomy    . Splenic artery embolization     Family History  Problem Relation Age of Onset  . Diabetes Other   . Hypertension Other    History  Substance Use Topics  . Smoking status: Never Smoker   . Smokeless tobacco: Not on file  . Alcohol Use: No   OB History   Grav Para Term Preterm Abortions TAB SAB Ect Mult Living                 Review of Systems  Constitutional: Negative for fever and chills.  Respiratory: Negative for cough and wheezing.   Cardiovascular: Positive for chest pain.  Gastrointestinal: Negative for abdominal pain.  Genitourinary: Negative for dysuria.  Musculoskeletal: Negative for back pain, neck pain and neck stiffness.  Skin: Negative for rash.  Neurological: Negative for headaches.  All other systems reviewed and are negative.      Allergies  Aspirin; Ibuprofen; Meperidine hcl; Nsaids; Codeine; Iohexol; Sulfonamide derivatives;  Tramadol; Cephalosporins; and Penicillins  Home Medications   Current Outpatient Rx  Name  Route  Sig  Dispense  Refill  . amLODipine (NORVASC) 5 MG tablet   Oral   Take 1 tablet by mouth daily.         . clonazePAM (KLONOPIN) 0.5 MG tablet   Oral   Take 1 tablet by mouth at bedtime. For sleep         . CRESTOR 20 MG tablet   Oral   Take 10 mg by mouth daily.         . furosemide (LASIX) 40 MG tablet   Oral   Take 1 tablet by mouth daily.         . methocarbamol (ROBAXIN) 500 MG tablet   Oral   Take 1 tablet by mouth 3 (three) times daily as needed.         . Multiple Vitamin (MULTIVITAMIN WITH MINERALS) TABS tablet   Oral   Take 1 tablet by mouth daily.         . quinapril (ACCUPRIL) 40 MG tablet   Oral   Take 1 tablet by mouth daily.          BP 132/85  Pulse 76  Temp(Src) 98.4 F (36.9 C) (Oral)  Resp 18  SpO2 99% Physical Exam  Constitutional: She is oriented to person, place, and time. She appears well-developed and well-nourished.  HENT:  Head: Normocephalic and atraumatic.  Eyes: EOM are normal. Pupils are equal, round,  and reactive to light.  Neck: Neck supple.  Cardiovascular: Normal rate, regular rhythm and intact distal pulses.   Pulmonary/Chest: Effort normal and breath sounds normal. No respiratory distress. She exhibits no tenderness.  Abdominal: Soft. Bowel sounds are normal. She exhibits no distension. There is no tenderness.  Musculoskeletal: Normal range of motion. She exhibits no edema and no tenderness.  No thoracic or lumbar tenderness  Neurological: She is alert and oriented to person, place, and time.  Skin: Skin is warm and dry.    ED Course  Procedures (including critical care time) Labs Review Labs Reviewed  CBC - Abnormal; Notable for the following:    WBC 10.7 (*)    All other components within normal limits  BASIC METABOLIC PANEL - Abnormal; Notable for the following:    Potassium 3.3 (*)    Glucose, Bld 175  (*)    Creatinine, Ser 1.19 (*)    GFR calc non Af Amer 49 (*)    GFR calc Af Amer 56 (*)    All other components within normal limits  D-DIMER, QUANTITATIVE  I-STAT TROPOININ, ED  I-STAT TROPOININ, ED   Imaging Review Dg Chest 2 View  08/30/2013   CLINICAL DATA:  Mid chest pain radiating into the back which began yesterday. Current history of hypertension and diabetes.  EXAM: CHEST  2 VIEW  COMPARISON:  Two-view chest x-ray 07/19/2009, 08/31/2008, 07/05/2005. CTA chest 08/31/2008.  FINDINGS: Cardiomediastinal silhouette unremarkable and unchanged. Lungs clear. Bronchovascular markings normal. Pulmonary vascularity normal. No visible pleural effusions. No pneumothorax. Visualized bony thorax intact. Surgical clips in the left upper quadrant from prior splenectomy. Surgical clips in the right upper quadrant from prior cholecystectomy.  IMPRESSION: No acute cardiopulmonary disease. Stable examination.   Electronically Signed   By: Evangeline Dakin M.D.   On: 08/30/2013 23:23     EKG Interpretation   Date/Time:  Friday August 30 2013 22:25:40 EDT Ventricular Rate:  77 PR Interval:  208 QRS Duration: 86 QT Interval:  400 QTC Calculation: 452 R Axis:   65 Text Interpretation:  Normal sinus rhythm Premature ventricular complexes  No significant change since last tracing otherwise Confirmed by Versia Mignogna  MD,  Giselle Brutus ([id number]) on 08/31/2013 12:16:26 AM     Nitroglycerin and morphine provided MED consulted/ admit  MDM   Diagnosis chest pain  58 year-old female with mid chest pain radiating to her back History of diabetes and hypertension  Evaluated with EKG, chest x-ray and labs reviewed as above. Medications provided. MED admit  Teressa Lower, MD 08/31/13 [phone]

## 2013-08-31 NOTE — Progress Notes (Signed)
Nursing note Patient given discharge instructions, AVS and medication list. Questions were answered will discharge home as ordered. Alazay Leicht, Bettina Gavia RN

## 2013-08-31 NOTE — Consult Note (Signed)
CARDIOLOGY CONSULT NOTE  Patient ID: [name] MRN: [id number] DOB/AGE: 02-14-1956 58 y.o.  Admit date: 08/31/2013 Primary Physician Tivis Ringer, MD Primary Cardiologist None Chief Complaint  Chest pain  HPI:  The patient presents for evaluation of chest pain.  This started yesterday AM.  Her blood sugar was low and she ate a cookie.  After this she had some sharp progressing to dull chest pain.  It was mid sternal and radiating through to her back.  She had never had this before.  She did not have associated symptoms although she felt that if she could throw up she would feel better.  The pain did at first go away and then it came back and was at the peak an 8/10.  She came to the ER .  It started to go away when she was in the waiting area.  In the ER her enzymes, D dimer and EKG were unremarkable.  She did get Zofran and morphine without relief and then NTG x 3.  The pain eventually went away after the 3rd NTG.  She has otherwise been able to swim for exercise and she denies any symptoms.     Past Medical History  Diagnosis Date  . Diabetes mellitus   . Hypertension   . Hyperlipidemia     Past Surgical History  Procedure Laterality Date  . Abdominal hysterectomy    . Gallbladder surgery    . Lymph nodectomy    . Splenic artery embolization      due to Aneurysm    Allergies  Allergen Reactions  . Aspirin Anaphylaxis  . Ibuprofen Anaphylaxis  . Meperidine Hcl Anaphylaxis  . Nsaids Anaphylaxis  . Codeine Nausea And Vomiting  . Iohexol Other (See Comments)     Desc: ELEVATION OF B.P, PASSED OUT, PT. NEEDS PRE-MEDS. FOR IODINE CONTRAST   . Sulfonamide Derivatives Hives  . Tramadol Nausea And Vomiting  . Cephalosporins Rash  . Penicillins Rash   Prescriptions prior to admission  Medication Sig Dispense Refill  . amLODipine (NORVASC) 5 MG tablet Take 1 tablet by mouth daily.      . clonazePAM (KLONOPIN) 0.5 MG tablet Take 1 tablet by mouth at bedtime. For sleep       . CRESTOR 20 MG tablet Take 10 mg by mouth daily.      . furosemide (LASIX) 40 MG tablet Take 1 tablet by mouth daily.      . methocarbamol (ROBAXIN) 500 MG tablet Take 1 tablet by mouth 3 (three) times daily as needed.      . Multiple Vitamin (MULTIVITAMIN WITH MINERALS) TABS tablet Take 1 tablet by mouth daily.      . quinapril (ACCUPRIL) 40 MG tablet Take 1 tablet by mouth daily.       Family History  Problem Relation Age of Onset  . Diabetes Father   . Hypertension Father     History   Social History  . Marital Status: Married    Spouse Name: N/A    Number of Children: N/A  . Years of Education: N/A   Occupational History  . Not on file.   Social History Main Topics  . Smoking status: Never Smoker   . Smokeless tobacco: Not on file  . Alcohol Use: No  . Drug Use:   . Sexual Activity: Yes    Birth Control/ Protection: Surgical     Comment: hysterectomy   Other Topics Concern  . Not on file   Social History  Narrative  . No narrative on file     ROS:    As stated in the HPI and negative for all other systems.  Physical Exam: Blood pressure 108/69, pulse 73, temperature 97.5 F (36.4 C), temperature source Oral, resp. rate 16, height 5\' 9"  (1.753 m), weight 209 lb 3.5 oz (94.9 kg), SpO2 98.00%.  GENERAL:  Well appearing HEENT:  Pupils equal round and reactive, fundi not visualized, oral mucosa unremarkable NECK:  No jugular venous distention, waveform within normal limits, carotid upstroke brisk and symmetric, no bruits, no thyromegaly LYMPHATICS:  No cervical, inguinal adenopathy LUNGS:  Clear to auscultation bilaterally BACK:  No CVA tenderness CHEST:  Unremarkable HEART:  PMI not displaced or sustained,S1 and S2 within normal limits, no S3, no S4, no clicks, no rubs, no murmurs ABD:  Flat, positive bowel sounds normal in frequency in pitch, no bruits, no rebound, no guarding, no midline pulsatile mass, no hepatomegaly, no splenomegaly, mild abdominal  tenderness EXT:  2 plus pulses throughout, no edema, no cyanosis no clubbing SKIN:  No rashes no nodules NEURO:  Cranial nerves II through XII grossly intact, motor grossly intact throughout PSYCH:  Cognitively intact, oriented to person place and time   Labs: Lab Results  Component Value Date   BUN 14 08/30/2013   Lab Results  Component Value Date   CREATININE 1.19* 08/30/2013   Lab Results  Component Value Date   NA 142 08/30/2013   K 3.3* 08/30/2013   CL 99 08/30/2013   CO2 30 08/30/2013   Lab Results  Component Value Date   TROPONINI  Value: 0.01        NO INDICATION OF MYOCARDIAL INJURY. 07/20/2009   Lab Results  Component Value Date   WBC 10.7* 08/30/2013   HGB 13.0 08/30/2013   HCT 38.9 08/30/2013   MCV 95.1 08/30/2013   PLT 309 08/30/2013     Radiology:   CXR: No acute cardiopulmonary disease. Stable examination.   EKG:  NSR, rate 77, axis WNL, no acute ST T wave changes.  08/31/2013  ASSESSMENT AND PLAN:   Chest pain:  Her Heart Score is 3.  The pretest probability of obstructive CAD is low.  She does have risk factors however.  I will bring the patient back for a POET (Plain Old Exercise Test). This will allow me to screen for obstructive coronary disease, risk stratify and very importantly provide a prescription for exercise.  We will schedule the stress test and call her.    SignedMinus Breeding 08/31/2013, 7:36 AM

## 2013-08-31 NOTE — Discharge Summary (Signed)
[name], is a 58 y.o. female  DOB 17-Oct-1955  MRN [id number].  Admission date:  08/31/2013  Admitting Physician  Theressa Millard, MD  Discharge Date:  08/31/2013   Primary MD  Tivis Ringer, MD  Recommendations for primary care physician for things to follow:   CXR and BMP in 1 week   Admission Diagnosis  Type II or unspecified type diabetes mellitus without mention of complication, not stated as uncontrolled [250.00] Other and unspecified hyperlipidemia [272.4] Unspecified essential hypertension [401.9]   Discharge Diagnosis  Type II or unspecified type diabetes mellitus without mention of complication, not stated as uncontrolled [250.00] Other and unspecified hyperlipidemia [272.4] Unspecified essential hypertension [401.9]     Principal Problem:   Chest pain Active Problems:   DIABETES MELLITUS, TYPE II   HYPERLIPIDEMIA   HYPERTENSION      Past Medical History  Diagnosis Date  . Diabetes mellitus   . Hypertension   . Hyperlipidemia     Past Surgical History  Procedure Laterality Date  . Abdominal hysterectomy    . Gallbladder surgery    . Lymph nodectomy    . Splenic artery embolization      due to Aneurysm     Discharge Condition: Stable   Follow UP  Follow-up Information   Follow up with [facility] (Our office will call you for a follow-up appointment and stress test arrangements. Please call the office if you have not heard from [place] within 3 days.)    Specialty:  Cardiology   Contact information:   [address], [address] [place] [address] [phone]      Follow up with Tivis Ringer, MD. Schedule an appointment as soon as possible for a visit in 1 week.   Specialty:  Internal Medicine   Contact information:   [facility] [place] [address] [phone]         Discharge Instructions  and  Discharge Medications      Discharge Orders   Future Orders Complete By Expires   Discharge instructions  As directed    Comments:     Follow with Primary MD Tivis Ringer, MD in 7 days   Get CBC, CMP, checked 7 days by Primary MD and again as instructed by your Primary MD. Get a 2 view Chest X ray done next visit .   Activity: As tolerated with Full fall precautions use walker/cane & assistance as needed   Disposition Home     Diet: Heart Healthy Low carb  For Heart failure patients - Check your Weight same time everyday, if you gain over 2 pounds, or you develop in leg swelling, experience more shortness of breath or chest pain, call your Primary MD immediately. Follow Cardiac Low Salt Diet and 1.8 lit/day fluid restriction.   On your next visit with her primary care physician please Get Medicines reviewed and adjusted.  Please request your Prim.MD to go over all Hospital Tests and Procedure/Radiological results  at the follow up, please get all Hospital records sent to your Prim MD by signing hospital release before you go home.   If you experience worsening of your admission symptoms, develop shortness of breath, life threatening emergency, suicidal or homicidal thoughts you must seek medical attention immediately by calling 911 or calling your MD immediately  if symptoms less severe.  You Must read complete instructions/literature along with all the possible adverse reactions/side effects for all the Medicines you take and that have been prescribed to you. Take any new Medicines after you have completely understood and accpet all the possible adverse reactions/side effects.   Do not drive and provide baby sitting services if your were admitted for syncope or siezures until you have seen by Primary MD or a Neurologist and advised to do so again.  Do not drive when taking  Pain medications.    Do not take more than prescribed Pain, Sleep and Anxiety Medications  Special Instructions: If you have smoked or chewed Tobacco  in the last 2 yrs please stop smoking, stop any regular Alcohol  and or any Recreational drug use.  Wear Seat belts while driving.   Please note  You were cared for by a hospitalist during your hospital stay. If you have any questions about your discharge medications or the care you received while you were in the hospital after you are discharged, you can call the unit and asked to speak with the hospitalist on call if the hospitalist that took care of you is not available. Once you are discharged, your primary care physician will handle any further medical issues. Please note that NO REFILLS for any discharge medications will be authorized once you are discharged, as it is imperative that you return to your primary care physician (or establish a relationship with a primary care physician if you do not have one) for your aftercare needs so that they can reassess your need for medications and monitor your lab values.   Increase activity slowly  As directed        Medication List         amLODipine 5 MG tablet  Commonly known as:  NORVASC  Take 1 tablet by mouth daily.     clonazePAM 0.5 MG tablet  Commonly known as:  KLONOPIN  Take 1 tablet by mouth at bedtime. For sleep     CRESTOR 20 MG tablet  Generic drug:  rosuvastatin  Take 10 mg by mouth daily.     furosemide 40 MG tablet  Commonly known as:  LASIX  Take 1 tablet by mouth daily.     methocarbamol 500 MG tablet  Commonly known as:  ROBAXIN  Take 1 tablet by mouth 3 (three) times daily as needed.     multivitamin with minerals Tabs tablet  Take 1 tablet by mouth daily.     quinapril 40 MG tablet  Commonly known as:  ACCUPRIL  Take 1 tablet by mouth daily.          Diet and Activity recommendation: See Discharge Instructions above   Consults obtained -  Cards   Major procedures and Radiology Reports - PLEASE review detailed and final reports for all details, in brief -       Dg Chest 2 View  08/30/2013   CLINICAL DATA:  Mid chest pain radiating into the back which began yesterday. Current history of hypertension and diabetes.  EXAM: CHEST  2 VIEW  COMPARISON:  Two-view chest x-ray 07/19/2009, 08/31/2008,  07/05/2005. CTA chest 08/31/2008.  FINDINGS: Cardiomediastinal silhouette unremarkable and unchanged. Lungs clear. Bronchovascular markings normal. Pulmonary vascularity normal. No visible pleural effusions. No pneumothorax. Visualized bony thorax intact. Surgical clips in the left upper quadrant from prior splenectomy. Surgical clips in the right upper quadrant from prior cholecystectomy.  IMPRESSION: No acute cardiopulmonary disease. Stable examination.   Electronically Signed   By: Evangeline Dakin M.D.   On: 08/30/2013 23:23    Micro Results      No results found for this or any previous visit (from the past 240 hour(s)).   History of present illness and  Hospital Course:     Kindly see H&P for history of present illness and admission details, please review complete Labs, Consult reports and Test reports for all details in brief [name], is a 58 y.o. female, patient with history of  With history of hypertension, type 2 diabetes mellitus, dyslipidemia was admitted to the hospital with atypical chest pain. In the ER EKG and lab work were unremarkable except for mildly low potassium which was replaced. This morning she is completely chest pain-free, cycled troponins are negative, she was seen by cardiologist Dr. York Ram was cleared her for home discharge with outpatient stress testing scheduled by him.    For her hypertension, diabetes mellitus type 2, dyslipidemia she will resume her home medications unchanged. Her low potassium has been replaced, will request PCP to consider adding a low-dose potassium if hypokalemia persists  on a chronic basis.     Today   Subjective:   [name] today has no headache,no chest abdominal pain,no new weakness tingling or numbness, feels much better wants to go home today.   Objective:   Blood pressure 108/69, pulse 73, temperature 97.5 F (36.4 C), temperature source Oral, resp. rate 16, height 5\' 9"  (1.753 m), weight 94.9 kg (209 lb 3.5 oz), SpO2 98.00%.  No intake or output data in the 24 hours ending 08/31/13 0901  Exam Awake Alert, Oriented *3, No new F.N deficits, Normal affect [place].AT,PERRAL Supple Neck,No JVD, No cervical lymphadenopathy appriciated.  Symmetrical Chest wall movement, Good air movement bilaterally, CTAB RRR,No Gallops,Rubs or new Murmurs, No Parasternal Heave +ve B.Sounds, Abd Soft, Non tender, No organomegaly appriciated, No rebound -guarding or rigidity. No Cyanosis, Clubbing or edema, No new Rash or bruise  Data Review   CBC w Diff:  Lab Results  Component Value Date   WBC 8.2 08/31/2013   HGB 12.4 08/31/2013   HCT 37.2 08/31/2013   PLT 288 08/31/2013   LYMPHOPCT 41 03/31/2011   BANDSPCT 8 08/24/2008   MONOPCT 12 03/31/2011   EOSPCT 2 03/31/2011   BASOPCT 1 03/31/2011    CMP:  Lab Results  Component Value Date   NA 143 08/31/2013   K 3.4* 08/31/2013   CL 102 08/31/2013   CO2 28 08/31/2013   BUN 13 08/31/2013   CREATININE 0.92 08/31/2013   PROT 6.9 03/31/2011   ALBUMIN 3.7 03/31/2011   BILITOT 0.4 03/31/2011   ALKPHOS 78 03/31/2011   AST 18 03/31/2011   ALT 20 03/31/2011  .   Total Time in preparing paper work, data evaluation and todays exam - 35 minutes  Thurnell Lose M.D on 08/31/2013 at 9:01 AM  [place]  [phone]

## 2013-09-01 ENCOUNTER — Other Ambulatory Visit: Payer: Self-pay | Admitting: Physician Assistant

## 2013-09-01 DIAGNOSIS — R079 Chest pain, unspecified: Secondary | ICD-10-CM

## 2013-09-03 ENCOUNTER — Telehealth: Payer: Self-pay | Admitting: *Deleted

## 2013-09-03 NOTE — Telephone Encounter (Signed)
New problem   Pt returning a call and stated to please call her work # [phone] and also pt need Exercise tolerance test within a week per Dr Warren Lacy after hour vm, but nothing available.

## 2013-09-03 NOTE — Telephone Encounter (Signed)
    Patient Name Sex DOB SSN Address Phone    [name], [name] Female 1955/08/14 [id number] [place] [address] [phone] (Home) [phone] (Work) [phone] (Mobile)                  09/03/13 message copied from [place] Triage message:  Minus Breeding, MD [place] Triage            We were supposed to call this patient to schedule a POET. Will you please let me know when this has been scheduled.      09/03/13 LMTCB for pt.

## 2013-09-03 NOTE — Telephone Encounter (Signed)
Instructions for GXT given to patient over the telephone, paperwork in tray.  Please call pt to schedule GXT per Dr Percival Spanish. Please let him know when this is scheduled.

## 2013-09-05 ENCOUNTER — Telehealth (HOSPITAL_COMMUNITY): Payer: Self-pay

## 2013-09-06 ENCOUNTER — Ambulatory Visit (HOSPITAL_COMMUNITY)
Admission: RE | Admit: 2013-09-06 | Discharge: 2013-09-06 | Disposition: A | Discharge status: Home / Self Care | Payer: 59 | Source: Ambulatory Visit | Attending: Internal Medicine | Admitting: Internal Medicine

## 2013-09-06 ENCOUNTER — Telehealth: Payer: Self-pay | Admitting: *Deleted

## 2013-09-06 DIAGNOSIS — R079 Chest pain, unspecified: Secondary | ICD-10-CM

## 2013-09-06 NOTE — Telephone Encounter (Signed)
No evidence of ischemia. No further work up. Call Ms. Harder with the results Per Dr Percival Spanish Left message for pt to call back for results

## 2013-09-09 NOTE — Telephone Encounter (Signed)
Left message to call back for results

## 2013-09-10 ENCOUNTER — Encounter: Payer: Self-pay | Admitting: *Deleted

## 2013-09-10 NOTE — Telephone Encounter (Signed)
Follow up     Patient is calling to get stress test results

## 2013-09-10 NOTE — Telephone Encounter (Signed)
Pt aware of results 

## 2013-09-18 ENCOUNTER — Encounter: Payer: 59 | Admitting: Physician Assistant

## 2013-10-31 ENCOUNTER — Other Ambulatory Visit: Payer: Self-pay | Admitting: Internal Medicine

## 2013-10-31 DIAGNOSIS — N644 Mastodynia: Secondary | ICD-10-CM

## 2013-11-12 ENCOUNTER — Other Ambulatory Visit: Payer: Self-pay | Admitting: Internal Medicine

## 2013-11-12 DIAGNOSIS — N644 Mastodynia: Secondary | ICD-10-CM

## 2013-11-13 ENCOUNTER — Ambulatory Visit
Admission: RE | Admit: 2013-11-13 | Discharge: 2013-11-13 | Disposition: A | Discharge status: Home / Self Care | Payer: 59 | Source: Ambulatory Visit | Attending: Internal Medicine | Admitting: Internal Medicine

## 2013-11-13 ENCOUNTER — Encounter (INDEPENDENT_AMBULATORY_CARE_PROVIDER_SITE_OTHER): Payer: Self-pay

## 2013-11-13 DIAGNOSIS — N644 Mastodynia: Secondary | ICD-10-CM

## 2013-12-11 NOTE — Telephone Encounter (Signed)
Encounter complete. 

## 2014-02-13 ENCOUNTER — Other Ambulatory Visit: Payer: Self-pay

## 2014-02-13 DIAGNOSIS — Z1231 Encounter for screening mammogram for malignant neoplasm of breast: Secondary | ICD-10-CM

## 2014-02-24 ENCOUNTER — Other Ambulatory Visit (HOSPITAL_COMMUNITY): Payer: Self-pay | Admitting: Gastroenterology

## 2014-02-24 ENCOUNTER — Other Ambulatory Visit: Payer: Self-pay | Admitting: Gastroenterology

## 2014-02-24 DIAGNOSIS — R1030 Lower abdominal pain, unspecified: Secondary | ICD-10-CM

## 2014-02-24 DIAGNOSIS — R109 Unspecified abdominal pain: Secondary | ICD-10-CM

## 2014-02-27 ENCOUNTER — Ambulatory Visit (HOSPITAL_COMMUNITY)
Admission: RE | Admit: 2014-02-27 | Discharge: 2014-02-27 | Disposition: A | Discharge status: Home / Self Care | Payer: 59 | Source: Ambulatory Visit | Attending: Gastroenterology | Admitting: Gastroenterology

## 2014-02-27 ENCOUNTER — Encounter (HOSPITAL_COMMUNITY): Payer: Self-pay

## 2014-02-27 DIAGNOSIS — Z9089 Acquired absence of other organs: Secondary | ICD-10-CM | POA: Insufficient documentation

## 2014-02-27 DIAGNOSIS — I728 Aneurysm of other specified arteries: Secondary | ICD-10-CM | POA: Diagnosis not present

## 2014-02-27 DIAGNOSIS — R109 Unspecified abdominal pain: Secondary | ICD-10-CM | POA: Insufficient documentation

## 2014-02-27 DIAGNOSIS — Z90711 Acquired absence of uterus with remaining cervical stump: Secondary | ICD-10-CM | POA: Insufficient documentation

## 2014-02-27 DIAGNOSIS — Z9889 Other specified postprocedural states: Secondary | ICD-10-CM | POA: Diagnosis not present

## 2014-02-27 DIAGNOSIS — R1030 Lower abdominal pain, unspecified: Secondary | ICD-10-CM

## 2014-02-27 MED ORDER — IOHEXOL 300 MG/ML  SOLN
100.0000 mL | Freq: Once | INTRAMUSCULAR | Status: AC | PRN
  Administered 2014-02-27: 100 mL via INTRAVENOUS

## 2014-03-14 ENCOUNTER — Ambulatory Visit
Admission: RE | Admit: 2014-03-14 | Discharge: 2014-03-14 | Disposition: A | Discharge status: Home / Self Care | Payer: 59 | Source: Ambulatory Visit

## 2014-03-14 DIAGNOSIS — Z1231 Encounter for screening mammogram for malignant neoplasm of breast: Secondary | ICD-10-CM

## 2014-03-17 ENCOUNTER — Other Ambulatory Visit: Payer: Self-pay | Admitting: Internal Medicine

## 2014-03-17 DIAGNOSIS — R928 Other abnormal and inconclusive findings on diagnostic imaging of breast: Secondary | ICD-10-CM

## 2014-03-20 ENCOUNTER — Ambulatory Visit
Admission: RE | Admit: 2014-03-20 | Discharge: 2014-03-20 | Disposition: A | Discharge status: Home / Self Care | Payer: 59 | Source: Ambulatory Visit | Attending: Internal Medicine | Admitting: Internal Medicine

## 2014-03-20 DIAGNOSIS — R928 Other abnormal and inconclusive findings on diagnostic imaging of breast: Secondary | ICD-10-CM

## 2015-02-11 ENCOUNTER — Emergency Department (HOSPITAL_COMMUNITY): Payer: 59

## 2015-02-11 ENCOUNTER — Emergency Department (HOSPITAL_COMMUNITY)
Admission: EM | Admit: 2015-02-11 | Discharge: 2015-02-12 | Disposition: A | Discharge status: Home / Self Care | Payer: 59 | Attending: Emergency Medicine | Admitting: Emergency Medicine

## 2015-02-11 ENCOUNTER — Encounter (HOSPITAL_COMMUNITY): Payer: Self-pay

## 2015-02-11 DIAGNOSIS — Z880 Allergy status to penicillin: Secondary | ICD-10-CM | POA: Diagnosis not present

## 2015-02-11 DIAGNOSIS — G459 Transient cerebral ischemic attack, unspecified: Secondary | ICD-10-CM

## 2015-02-11 DIAGNOSIS — Z79899 Other long term (current) drug therapy: Secondary | ICD-10-CM | POA: Diagnosis not present

## 2015-02-11 DIAGNOSIS — E119 Type 2 diabetes mellitus without complications: Secondary | ICD-10-CM | POA: Diagnosis not present

## 2015-02-11 DIAGNOSIS — I10 Essential (primary) hypertension: Secondary | ICD-10-CM | POA: Insufficient documentation

## 2015-02-11 DIAGNOSIS — H538 Other visual disturbances: Secondary | ICD-10-CM | POA: Diagnosis present

## 2015-02-11 DIAGNOSIS — E785 Hyperlipidemia, unspecified: Secondary | ICD-10-CM | POA: Diagnosis not present

## 2015-02-11 DIAGNOSIS — G43909 Migraine, unspecified, not intractable, without status migrainosus: Secondary | ICD-10-CM | POA: Diagnosis not present

## 2015-02-11 HISTORY — DX: Migraine, unspecified, not intractable, without status migrainosus: G43.909

## 2015-02-11 LAB — BASIC METABOLIC PANEL
ANION GAP: 8 (ref 5–15)
BUN: 16 mg/dL (ref 6–20)
CO2: 28 mmol/L (ref 22–32)
Calcium: 9.3 mg/dL (ref 8.9–10.3)
Chloride: 106 mmol/L (ref 101–111)
Creatinine, Ser: 0.95 mg/dL (ref 0.44–1.00)
GFR calc Af Amer: >60 mL/min (ref >60)
Glucose, Bld: 105 mg/dL — ABNORMAL HIGH (ref 65–99)
Potassium: 3.4 mmol/L — ABNORMAL LOW (ref 3.5–5.1)
Sodium: 142 mmol/L (ref 135–145)

## 2015-02-11 LAB — CBC WITH DIFFERENTIAL/PLATELET
Basophils Absolute: 0.1 10*3/uL (ref 0.0–0.1)
Basophils Relative: 1 % (ref 0–1)
EOS PCT: 2 % (ref 0–5)
Eosinophils Absolute: 0.3 10*3/uL (ref 0.0–0.7)
HCT: 39.4 % (ref 36.0–46.0)
HEMOGLOBIN: 13 g/dL (ref 12.0–15.0)
LYMPHS PCT: 50 % — AB (ref 12–46)
Lymphs Abs: 5.5 10*3/uL — ABNORMAL HIGH (ref 0.7–4.0)
MCH: 31.6 pg (ref 26.0–34.0)
MCHC: 33 g/dL (ref 30.0–36.0)
MCV: 95.6 fL (ref 78.0–100.0)
Monocytes Absolute: 1.3 10*3/uL — ABNORMAL HIGH (ref 0.1–1.0)
Monocytes Relative: 11 % (ref 3–12)
NEUTROS PCT: 36 % — AB (ref 43–77)
Neutro Abs: 4 10*3/uL (ref 1.7–7.7)
PLATELETS: 309 10*3/uL (ref 150–400)
RBC: 4.12 MIL/uL (ref 3.87–5.11)
RDW: 13.7 % (ref 11.5–15.5)
WBC: 11.1 10*3/uL — AB (ref 4.0–10.5)

## 2015-02-11 LAB — TROPONIN I

## 2015-02-11 NOTE — ED Notes (Signed)
MD at bedside. 

## 2015-02-11 NOTE — ED Provider Notes (Signed)
CSN: [id number]     Arrival date & time 02/11/15  1956 History   First MD Initiated Contact with Patient 02/11/15 2029     Chief Complaint  Patient presents with  . Migraine  . Blurred Vision     (Consider location/radiation/quality/duration/timing/severity/associated sxs/prior Treatment) HPI Comments: PT comes in with cc of blurry vision. Pt has hx of migraines and DM. Reports that she was at her church and walking down the aisle when she suddenly had a headache (different that her migraine headache) and her vision just went gray and "foggy." She then saw 3 of everything, she thinks from both of her eyes. She reports no numbness, tingling, dizziness, near fainting, vertigo, numbness, weakness. No hx of strokes, TIA. No trauma. The symptoms lasted for 20 minutes and started improving, and have now resolved.   Patient is a 59 y.o. female presenting with migraines. The history is provided by the patient.  Migraine Associated symptoms include headaches. Pertinent negatives include no chest pain, no abdominal pain and no shortness of breath.    Past Medical History  Diagnosis Date  . Diabetes mellitus   . Hypertension   . Hyperlipidemia   . Migraines 2013   Past Surgical History  Procedure Laterality Date  . Abdominal hysterectomy    . Gallbladder surgery    . Lymph nodectomy    . Splenic artery embolization      due to Aneurysm  . Tonsillectomy     Family History  Problem Relation Age of Onset  . Diabetes Father   . Hypertension Father    Social History  Substance Use Topics  . Smoking status: Never Smoker   . Smokeless tobacco: None  . Alcohol Use: No   OB History    No data available     Review of Systems  Constitutional: Negative for activity change.  Eyes: Positive for visual disturbance. Negative for photophobia and pain.  Respiratory: Negative for shortness of breath.   Cardiovascular: Negative for chest pain.  Gastrointestinal: Negative for nausea,  vomiting and abdominal pain.  Genitourinary: Negative for dysuria.  Musculoskeletal: Negative for neck pain.  Neurological: Positive for headaches.      Allergies  Aspirin; Demerol; Ibuprofen; Meperidine hcl; Nsaids; Codeine; Iohexol; Sulfonamide derivatives; Tramadol; Cephalosporins; and Penicillins  Home Medications   Prior to Admission medications   Medication Sig Start Date End Date Taking? Authorizing Provider  amLODipine (NORVASC) 5 MG tablet Take 1 tablet by mouth daily. 08/26/13   Historical Provider, MD  clonazePAM (KLONOPIN) 0.5 MG tablet Take 1 tablet by mouth at bedtime. For sleep 08/16/13   Historical Provider, MD  CRESTOR 20 MG tablet Take 10 mg by mouth daily. 08/07/13   Historical Provider, MD  furosemide (LASIX) 40 MG tablet Take 1 tablet by mouth daily. 08/07/13   Historical Provider, MD  methocarbamol (ROBAXIN) 500 MG tablet Take 1 tablet by mouth 3 (three) times daily as needed. 06/05/13   Historical Provider, MD  Multiple Vitamin (MULTIVITAMIN WITH MINERALS) TABS tablet Take 1 tablet by mouth daily.    Historical Provider, MD  quinapril (ACCUPRIL) 40 MG tablet Take 1 tablet by mouth daily. 08/07/13   Historical Provider, MD   BP 126/75 mmHg  Pulse 77  Temp(Src) 97.8 F (36.6 C) (Oral)  Resp 14  Ht 5' 8.5" (1.74 m)  Wt 214 lb (97.07 kg)  BMI 32.06 kg/m2  SpO2 95% Physical Exam  Constitutional: She is oriented to person, place, and time. She appears well-developed.  HENT:  Head: Normocephalic and atraumatic.  Eyes: EOM are normal. Pupils are equal, round, and reactive to light.  Neck: Normal range of motion. Neck supple.  Cardiovascular: Normal rate and regular rhythm.   Pulmonary/Chest: Effort normal and breath sounds normal. No respiratory distress.  Abdominal: Soft. Bowel sounds are normal. She exhibits no distension. There is no tenderness. There is no rebound and no guarding.  Neurological: She is alert and oriented to person, place, and time. No cranial  nerve deficit. Coordination normal.  Cerebellar exam is normal (finger to nose) Sensory exam normal for bilateral upper and lower extremities Motor exam is 4+/5   Skin: Skin is warm and dry.    ED Course  Procedures (including critical care time) Labs Review Labs Reviewed  CBC WITH DIFFERENTIAL/PLATELET - Abnormal; Notable for the following:    WBC 11.1 (*)    Neutrophils Relative % 36 (*)    Lymphocytes Relative 50 (*)    Lymphs Abs 5.5 (*)    Monocytes Absolute 1.3 (*)    All other components within normal limits  BASIC METABOLIC PANEL - Abnormal; Notable for the following:    Potassium 3.4 (*)    Glucose, Bld 105 (*)    All other components within normal limits  TROPONIN I    Imaging Review Mr Brain Wo Contrast  02/12/2015   CLINICAL DATA:  Initial evaluation for acute blurry vision.  EXAM: MRI HEAD WITHOUT CONTRAST  TECHNIQUE: Multiplanar, multiecho pulse sequences of the brain and surrounding structures were obtained without intravenous contrast.  COMPARISON:  Prior CT from 08/27/2010.  FINDINGS: Cerebral volume within normal limits for patient age. A few scattered subcentimeter T2/FLAIR hyperintense foci noted within the periventricular and deep white matter, nonspecific, but of doubtful clinical significance, and felt to be within normal limits for patient age.  No abnormal foci of restricted diffusion to suggest acute intracranial infarct. Gray-white matter differentiation maintained. Normal intravascular flow voids are preserved. No acute or chronic intracranial hemorrhage.  No mass lesion, midline shift, or mass effect. No hydrocephalus. No extra-axial fluid collection.  Craniocervical junction within normal limits. Pituitary gland normal. No acute abnormality about the orbits.  Paranasal sinuses are clear. No mastoid effusion. Inner ear structures grossly normal.  Bone marrow signal intensity within normal limits. Scalp soft tissues unremarkable.  IMPRESSION: Normal brain MRI  with no acute intracranial process identified.   Electronically Signed   By: Jeannine Boga M.D.   On: 02/12/2015 00:00   I have personally reviewed and evaluated these images and lab results as part of my medical decision-making.   EKG Interpretation   Date/Time:  Wednesday February 11 2015 21:35:31 EDT Ventricular Rate:  78 PR Interval:  167 QRS Duration: 87 QT Interval:  391 QTC Calculation: 445 R Axis:   68 Text Interpretation:  Sinus rhythm Low voltage, precordial leads No acute  changes Confirmed by Kathrynn Humble, MD, Thelma Comp [phone]) on 02/11/2015 9:46:49 PM      MDM   Final diagnoses:  Transient cerebral ischemia, unspecified transient cerebral ischemia type    Pt comes in with cc of headaches and blurry vision. DDX: TIA, stroke, ocular migraines. She has hx of migraines, but reports that her current symptoms were not like migraine headaches. If TIA- ABCD2 score is 3.  After discussing the treatment options with the patient, she prefers aggressive ER workup followed by outpatient pcp f/u - will get MRI brain, which is neg, she will see her pcp for further TIA workup.  Varney Biles, MD 02/12/15 [phone]

## 2015-02-11 NOTE — ED Notes (Signed)
Pt returned to room E42 from MRI

## 2015-02-11 NOTE — ED Notes (Signed)
Patient transported to MRI 

## 2015-02-11 NOTE — ED Notes (Signed)
Per EMS pt started having a sudden onset of migraine at 1650 with blurred vision; Pt has hx of HTN and migraines; Pt states this episodes does not feel like normal migaraine; Pt c/o of pain 3/10 and "hazy" vision on arrival; Pt a&o x 4 on arrival. Pt states HA is in front of forehead; denies n/v,LOC, or numbness or tingling.

## 2015-02-12 NOTE — Discharge Instructions (Signed)
We saw you in the ER for the visual complains. All the results in the ER are normal, labs and imaging. We are not sure what is causing your symptoms. The workup in the ER is not complete, and is limited to screening for life threatening and emergent conditions only, so please see a primary care doctor for further evaluation.  Transient Ischemic Attack A transient ischemic attack (TIA) is a "warning stroke" that causes stroke-like symptoms. Unlike a stroke, a TIA does not cause permanent damage to the brain. The symptoms of a TIA can happen very fast and do not last long. It is important to know the symptoms of a TIA and what to do. This can help prevent a major stroke or death. CAUSES   A TIA is caused by a temporary blockage in an artery in the brain or neck (carotid artery). The blockage does not allow the brain to get the blood supply it needs and can cause different symptoms. The blockage can be caused by either:  A blood clot.  Fatty buildup (plaque) in a neck or brain artery. RISK FACTORS  High blood pressure (hypertension).  High cholesterol.  Diabetes mellitus.  Heart disease.  The build up of plaque in the blood vessels (peripheral artery disease or atherosclerosis).  The build up of plaque in the blood vessels providing blood and oxygen to the brain (carotid artery stenosis).  An abnormal heart rhythm (atrial fibrillation).  Obesity.  Smoking.  Taking oral contraceptives (especially in combination with smoking).  Physical inactivity.  A diet high in fats, salt (sodium), and calories.  Alcohol use.  Use of illegal drugs (especially cocaine and methamphetamine).  Being female.  Being African American.  Being over the age of 18.  Family history of stroke.  Previous history of blood clots, stroke, TIA, or heart attack.  Sickle cell disease. SYMPTOMS  TIA symptoms are the same as a stroke but are temporary. These symptoms usually develop suddenly, or may be  newly present upon awakening from sleep:  Sudden weakness or numbness of the face, arm, or leg, especially on one side of the body.  Sudden trouble walking or difficulty moving arms or legs.  Sudden confusion.  Sudden personality changes.  Trouble speaking (aphasia) or understanding.  Difficulty swallowing.  Sudden trouble seeing in one or both eyes.  Double vision.  Dizziness.  Loss of balance or coordination.  Sudden severe headache with no known cause.  Trouble reading or writing.  Loss of bowel or bladder control.  Loss of consciousness. DIAGNOSIS  Your caregiver may be able to determine the presence or absence of a TIA based on your symptoms, history, and physical exam. Computed tomography (CT scan) of the brain is usually performed to help identify a TIA. Other tests may be done to diagnose a TIA. These tests may include:  Electrocardiography.  Continuous heart monitoring.  Echocardiography.  Carotid ultrasonography.  Magnetic resonance imaging (MRI).  A scan of the brain circulation.  Blood tests. PREVENTION  The risk of a TIA can be decreased by appropriately treating high blood pressure, high cholesterol, diabetes, heart disease, and obesity and by quitting smoking, limiting alcohol, and staying physically active. TREATMENT  Time is of the essence. Since the symptoms of TIA are the same as a stroke, it is important to seek treatment as soon as possible because you may need a medicine to dissolve the clot (thrombolytic) that cannot be given if too much time has passed. Treatment options vary. Treatment options may  include rest, oxygen, intravenous (IV) fluids, and medicines to thin the blood (anticoagulants). Medicines and diet may be used to address diabetes, high blood pressure, and other risk factors. Measures will be taken to prevent short-term and long-term complications, including infection from breathing foreign material into the lungs (aspiration  pneumonia), blood clots in the legs, and falls. Treatment options include procedures to either remove plaque in the carotid arteries or dilate carotid arteries that have narrowed due to plaque. Those procedures are:  Carotid endarterectomy.  Carotid angioplasty and stenting. HOME CARE INSTRUCTIONS   Take all medicines prescribed by your caregiver. Follow the directions carefully. Medicines may be used to control risk factors for a stroke. Be sure you understand all your medicine instructions.  You may be told to take aspirin or the anticoagulant warfarin. Warfarin needs to be taken exactly as instructed.  Taking too much or too little warfarin is dangerous. Too much warfarin increases the risk of bleeding. Too little warfarin continues to allow the risk for blood clots. While taking warfarin, you will need to have regular blood tests to measure your blood clotting time. A PT blood test measures how long it takes for blood to clot. Your PT is used to calculate another value called an INR. Your PT and INR help your caregiver to adjust your dose of warfarin. The dose can change for many reasons. It is critically important that you take warfarin exactly as prescribed.  Many foods, especially foods high in vitamin K can interfere with warfarin and affect the PT and INR. Foods high in vitamin K include spinach, kale, broccoli, cabbage, collard and turnip greens, brussels sprouts, peas, cauliflower, seaweed, and parsley as well as beef and pork liver, green tea, and soybean oil. You should eat a consistent amount of foods high in vitamin K. Avoid major changes in your diet, or notify your caregiver before changing your diet. Arrange a visit with a dietitian to answer your questions.  Many medicines can interfere with warfarin and affect the PT and INR. You must tell your caregiver about any and all medicines you take, this includes all vitamins and supplements. Be especially cautious with aspirin and  anti-inflammatory medicines. Do not take or discontinue any prescribed or over-the-counter medicine except on the advice of your caregiver or pharmacist.  Warfarin can have side effects, such as excessive bruising or bleeding. You will need to hold pressure over cuts for longer than usual. Your caregiver or pharmacist will discuss other potential side effects.  Avoid sports or activities that may cause injury or bleeding.  Be mindful when shaving, flossing your teeth, or handling sharp objects.  Alcohol can change the body's ability to handle warfarin. It is best to avoid alcoholic drinks or consume only very small amounts while taking warfarin. Notify your caregiver if you change your alcohol intake.  Notify your dentist or other caregivers before procedures.  Eat a diet that includes 5 or more servings of fruits and vegetables each day. This may reduce the risk of stroke. Certain diets may be prescribed to address high blood pressure, high cholesterol, diabetes, or obesity.  A low-sodium, low-saturated fat, low-trans fat, low-cholesterol diet is recommended to manage high blood pressure.  A low-saturated fat, low-trans fat, low-cholesterol, and high-fiber diet may control cholesterol levels.  A controlled-carbohydrate, controlled-sugar diet is recommended to manage diabetes.  A reduced-calorie, low-sodium, low-saturated fat, low-trans fat, low-cholesterol diet is recommended to manage obesity.  Maintain a healthy weight.  Stay physically active. It is recommended  that you get at least 30 minutes of activity on most or all days.  Do not smoke.  Limit alcohol use even if you are not taking warfarin. Moderate alcohol use is considered to be:  No more than 2 drinks each day for men.  No more than 1 drink each day for nonpregnant women.  Stop drug abuse.  Home safety. A safe home environment is important to reduce the risk of falls. Your caregiver may arrange for specialists to  evaluate your home. Having grab bars in the bedroom and bathroom is often important. Your caregiver may arrange for equipment to be used at home, such as raised toilets and a seat for the shower.  Follow all instructions for follow-up with your caregiver. This is very important. This includes any referrals and lab tests. Proper follow up can prevent a stroke or another TIA from occurring. SEEK MEDICAL CARE IF:  You have personality changes.  You have difficulty swallowing.  You are seeing double.  You have dizziness.  You have a fever.  You have skin breakdown. SEEK IMMEDIATE MEDICAL CARE IF:  Any of these symptoms may represent a serious problem that is an emergency. Do not wait to see if the symptoms will go away. Get medical help right away. Call your local emergency services (911 in [place] Do not drive yourself to the hospital.  You have sudden weakness or numbness of the face, arm, or leg, especially on one side of the body.  You have sudden trouble walking or difficulty moving arms or legs.  You have sudden confusion.  You have trouble speaking (aphasia) or understanding.  You have sudden trouble seeing in one or both eyes.  You have a loss of balance or coordination.  You have a sudden, severe headache with no known cause.  You have new chest pain or an irregular heartbeat.  You have a partial or total loss of consciousness. MAKE SURE YOU:   Understand these instructions.  Will watch your condition.  Will get help right away if you are not doing well or get worse. Document Released: 03/09/2005 Document Revised: 06/04/2013 Document Reviewed: 09/04/2013 [facility] Patient Information 2015 [place], [place]. This information is not intended to replace advice given to you by your health care provider. Make sure you discuss any questions you have with your health care provider.

## 2015-03-12 ENCOUNTER — Other Ambulatory Visit: Payer: Self-pay

## 2015-03-16 ENCOUNTER — Encounter (INDEPENDENT_AMBULATORY_CARE_PROVIDER_SITE_OTHER): Payer: Self-pay | Admitting: Ophthalmology

## 2015-04-07 ENCOUNTER — Encounter (INDEPENDENT_AMBULATORY_CARE_PROVIDER_SITE_OTHER): Payer: Self-pay | Admitting: Ophthalmology

## 2015-04-23 ENCOUNTER — Telehealth: Payer: Self-pay | Admitting: Cardiology

## 2015-04-23 NOTE — Telephone Encounter (Signed)
04/23/2015 Received faxed referral from [facility] for upcoming appointment with Dr. Percival Spanish on 05/14/2015.  Records given to [facility].  cbr

## 2015-05-14 ENCOUNTER — Ambulatory Visit (INDEPENDENT_AMBULATORY_CARE_PROVIDER_SITE_OTHER): Payer: 59 | Admitting: Cardiology

## 2015-05-14 ENCOUNTER — Encounter: Payer: Self-pay | Admitting: Cardiology

## 2015-05-14 VITALS — BP 150/80 | HR 83 | Ht 69.0 in | Wt 222.0 lb

## 2015-05-14 DIAGNOSIS — Z01818 Encounter for other preprocedural examination: Secondary | ICD-10-CM | POA: Diagnosis not present

## 2015-05-14 NOTE — Patient Instructions (Signed)
Your physician recommends that you schedule a follow-up appointment in: As Needed    

## 2015-05-14 NOTE — Progress Notes (Signed)
Cardiology Office Note   Date:  05/14/2015   ID:  [name], DOB 30-Aug-1955, MRN [id number]  PCP:  Katrina Ringer, MD  Cardiologist:   Katrina Breeding, MD   Chief Complaint  Patient presents with  . Palpitations      History of Present Illness: [name] is a 59 y.o. female who presents for palpitations.     I did see her in March 2015 when she was briefly hospitalized  For evaluation of chest pain. This was atypical. Follow-up stress testing was unremarkable for any evidence ischemia.   She is now being considered for surgery to remove her tonsils. She's had for preoperative clearance. I do see results of a recent emergency room visit earlier this year for questionable TIA. However, there was no change on her MRI. It was thought that she might of had some atypical migraine. She does occasionally get some symptoms feeling like she has palpitations but she was evaluated at one point recently for this and was not found to have any dysrhythmia and symptoms that she was having improved with TUMS.   She otherwise does well. She can swim for exercise though she hasn't reached because of some spinal stenosis. With this level of activity she denies any cardiovascular symptoms.  The patient denies any new symptoms such as chest discomfort, neck or arm discomfort. There has been no new shortness of breath, PND or orthopnea. There has been no presyncope or syncope.  I did review her ED records.    Past Medical History  Diagnosis Date  . Diabetes mellitus   . Hypertension   . Hyperlipidemia   . Migraines 2013    Past Surgical History  Procedure Laterality Date  . Abdominal hysterectomy    . Gallbladder surgery    . Lymph nodectomy    . Splenic artery embolization      due to Aneurysm  . Tonsillectomy    . Nasal sinus surgery    . Knee arthroscopy       Current Outpatient Prescriptions  Medication Sig Dispense Refill  . albuterol (VENTOLIN HFA) 108 (90 BASE) MCG/ACT  inhaler Inhale 1 puff into the lungs as needed.    Marland Kitchen amLODipine (NORVASC) 5 MG tablet Take 1 tablet by mouth daily.    . clobetasol cream (TEMOVATE) [id number] % Apply 1 application topically 2 (two) times daily.    . clonazePAM (KLONOPIN) 0.5 MG tablet Take 1 tablet by mouth at bedtime. For sleep    . CRESTOR 20 MG tablet Take 10 mg by mouth daily.    Marland Kitchen EPINEPHrine (EPIPEN 2-PAK) 0.3 mg/0.3 mL IJ SOAJ injection Inject 0.3 mLs as directed as needed.    Marland Kitchen esomeprazole (NEXIUM) 40 MG capsule Take 1 tablet by mouth daily.    . Fluticasone-Salmeterol (ADVAIR DISKUS) 500-50 MCG/DOSE AEPB Inhale 500 mcg into the lungs as needed.    . furosemide (LASIX) 40 MG tablet Take 1 tablet by mouth daily.    Marland Kitchen HYDROcodone-acetaminophen (NORCO/VICODIN) 5-325 MG tablet Take 1 tablet by mouth as needed.    . methocarbamol (ROBAXIN) 500 MG tablet Take 1 tablet by mouth 3 (three) times daily as needed.    . montelukast (SINGULAIR) 10 MG tablet Take 1 tablet by mouth as needed.    . Multiple Vitamin (MULTIVITAMIN WITH MINERALS) TABS tablet Take 1 tablet by mouth daily.    . quinapril (ACCUPRIL) 40 MG tablet Take 1 tablet by mouth daily.     No current facility-administered  medications for this visit.    Allergies:   Aspirin; Demerol; Ibuprofen; Meperidine hcl; Nsaids; Codeine; Iohexol; Sulfonamide derivatives; Tramadol; Cephalosporins; and Penicillins    ROS:  Please see the history of present illness.   Otherwise, review of systems are positive for none.   All other systems are reviewed and negative.    PHYSICAL EXAM: VS:  BP 150/80 mmHg  Pulse 83  Ht 5\' 9"  (1.753 m)  Wt 222 lb (100.699 kg)  BMI 32.77 kg/m2 , BMI Body mass index is 32.77 kg/(m^2). GENERAL:  Well appearing HEENT:  Pupils equal round and reactive, fundi not visualized, oral mucosa unremarkable NECK:  No jugular venous distention, waveform within normal limits, carotid upstroke brisk and symmetric, no bruits, no thyromegaly LYMPHATICS:  No  cervical, inguinal adenopathy LUNGS:  Clear to auscultation bilaterally BACK:  No CVA tenderness CHEST:  Unremarkable HEART:  PMI not displaced or sustained,S1 and S2 within normal limits, no S3, no S4, no clicks, no rubs, no murmurs ABD:  Flat, positive bowel sounds normal in frequency in pitch, no bruits, no rebound, no guarding, no midline pulsatile mass, no hepatomegaly, no splenomegaly EXT:  2 plus pulses throughout, no edema, no cyanosis no clubbing SKIN:  No rashes no nodules NEURO:  Cranial nerves II through XII grossly intact, motor grossly intact throughout PSYCH:  Cognitively intact, oriented to person place and time   EKG:  EKG is ordered today. The ekg ordered today demonstrates   Sinus rhythm, rate 3, axis within normal limits, intervals within normal limits, no acute ST-T wave changes.   Recent Labs: 02/11/2015: BUN 16; Creatinine, Ser 0.95; Hemoglobin 13.0; Platelets 309; Potassium 3.4*; Sodium 142    Lipid Panel    Component Value Date/Time   CHOL  07/20/2009 0343    170        ATP III CLASSIFICATION:  <200     mg/dL   Desirable  200-239  mg/dL   Borderline High  >=240    mg/dL   High          TRIG 170* 07/20/2009 0343   HDL 43 07/20/2009 0343   CHOLHDL 4.0 07/20/2009 0343   VLDL 34 07/20/2009 0343   LDLCALC  07/20/2009 0343    93        Total Cholesterol/HDL:CHD Risk Coronary Heart Disease Risk Table                     Men   Women  1/2 Average Risk   3.4   3.3  Average Risk       5.0   4.4  2 X Average Risk   9.6   7.1  3 X Average Risk  23.4   11.0        Use the calculated Patient Ratio above and the CHD Risk Table to determine the patient's CHD Risk.        ATP III CLASSIFICATION (LDL):  <100     mg/dL   Optimal  100-129  mg/dL   Near or Above                    Optimal  130-159  mg/dL   Borderline  160-189  mg/dL   High  >190     mg/dL   Very High      Wt Readings from Last 3 Encounters:  05/14/15 222 lb (100.699 kg)  02/11/15 214 lb  (97.07 kg)  08/31/13 209 lb 3.5 oz (94.9 kg)  Other studies Reviewed: Additional studies/ records that were reviewed today include: Dr. Danna Hefty office records. Review of the above records demonstrates:  Please see elsewhere in the note.     ASSESSMENT AND PLAN:  PRE OP:   The patient has a high functional level. There are no high risk findings or symptoms.   This would not be a high risk procedure from a cardiovascular standpoint. She had a negative stress test last year. Therefore, based on ACC/AHA guidelines, the patient would be at acceptable risk for the planned procedure without further cardiovascular testing.  PALPITATIONS:   He is not currently a problem. No change in therapy is indicated.  HTN:   Blood pressures well controlled. She'll continue the meds as listed.      Current medicines are reviewed at length with the patient today.  The patient does not have concerns regarding medicines.  The following changes have been made:  no change  Labs/ tests ordered today include:  No orders of the defined types were placed in this encounter.     Disposition:   FU with me as needed.      Signed, Katrina Breeding, MD  05/14/2015 3:14 PM    [facility]

## 2015-09-23 ENCOUNTER — Other Ambulatory Visit: Payer: Self-pay

## 2015-09-23 DIAGNOSIS — Z1231 Encounter for screening mammogram for malignant neoplasm of breast: Secondary | ICD-10-CM

## 2015-10-06 ENCOUNTER — Ambulatory Visit
Admission: RE | Admit: 2015-10-06 | Discharge: 2015-10-06 | Disposition: A | Discharge status: Home / Self Care | Payer: 59 | Source: Ambulatory Visit

## 2015-10-06 DIAGNOSIS — Z1231 Encounter for screening mammogram for malignant neoplasm of breast: Secondary | ICD-10-CM

## 2015-10-08 ENCOUNTER — Other Ambulatory Visit: Payer: Self-pay | Admitting: Internal Medicine

## 2015-10-08 DIAGNOSIS — R928 Other abnormal and inconclusive findings on diagnostic imaging of breast: Secondary | ICD-10-CM

## 2015-10-14 ENCOUNTER — Ambulatory Visit
Admission: RE | Admit: 2015-10-14 | Discharge: 2015-10-14 | Disposition: A | Discharge status: Home / Self Care | Payer: 59 | Source: Ambulatory Visit | Attending: Internal Medicine | Admitting: Internal Medicine

## 2015-10-14 DIAGNOSIS — R928 Other abnormal and inconclusive findings on diagnostic imaging of breast: Secondary | ICD-10-CM

## 2015-12-11 ENCOUNTER — Other Ambulatory Visit (HOSPITAL_COMMUNITY): Payer: Self-pay | Admitting: Internal Medicine

## 2015-12-11 DIAGNOSIS — R600 Localized edema: Secondary | ICD-10-CM

## 2015-12-14 ENCOUNTER — Ambulatory Visit (HOSPITAL_COMMUNITY): Payer: Managed Care, Other (non HMO) | Attending: Cardiovascular Disease

## 2015-12-14 ENCOUNTER — Other Ambulatory Visit: Payer: Self-pay

## 2015-12-14 DIAGNOSIS — R600 Localized edema: Secondary | ICD-10-CM | POA: Diagnosis present

## 2015-12-14 DIAGNOSIS — I10 Essential (primary) hypertension: Secondary | ICD-10-CM | POA: Insufficient documentation

## 2015-12-14 DIAGNOSIS — E119 Type 2 diabetes mellitus without complications: Secondary | ICD-10-CM | POA: Diagnosis not present

## 2015-12-14 DIAGNOSIS — E785 Hyperlipidemia, unspecified: Secondary | ICD-10-CM | POA: Insufficient documentation

## 2016-02-22 ENCOUNTER — Ambulatory Visit (INDEPENDENT_AMBULATORY_CARE_PROVIDER_SITE_OTHER): Payer: Managed Care, Other (non HMO) | Admitting: Podiatry

## 2016-02-22 ENCOUNTER — Encounter: Payer: Self-pay | Admitting: Podiatry

## 2016-02-22 VITALS — BP 117/70 | HR 89 | Resp 16 | Ht 68.5 in | Wt 230.0 lb

## 2016-02-22 DIAGNOSIS — L600 Ingrowing nail: Secondary | ICD-10-CM | POA: Diagnosis not present

## 2016-02-22 NOTE — Progress Notes (Signed)
   Subjective:    Patient ID: [name], female    DOB: 07-Feb-1956, 60 y.o.   MRN: [id number]  HPI  Chief Complaint  Patient presents with  . Nail Problem    right great .Marland Kitchen.. pt currently taking antibiotic pcp from but nail is still sore / lateral side.  Last glucose reading was 134 on Sat ... last A1c 5.6           Review of Systems  All other systems reviewed and are negative.      Objective:   Physical Exam        Assessment & Plan:

## 2016-02-22 NOTE — Patient Instructions (Signed)

## 2016-02-24 NOTE — Progress Notes (Signed)
Subjective:     Patient ID: [name], female   DOB: [birth year], 60 y.o.   MRN: [id number]  HPI patient presents stating the right toenail on the inside is very sore and she's been on antibiotics and she's tried to soak it and trim it herself. Also states that her sugar has been running well and her A1c was below 6   Review of Systems  All other systems reviewed and are negative.      Objective:   Physical Exam  Constitutional: She is oriented to person, place, and time.  Cardiovascular: Intact distal pulses.   Musculoskeletal: Normal range of motion.  Neurological: She is oriented to person, place, and time.  Skin: Skin is warm.  Nursing note and vitals reviewed.  neurovascular status intact muscle strength adequate range of motion within normal limits with patient found to have incurvated right hallux lateral border with pain in the corner and inability to wear shoe gear comfortably. Patient has tried different treatment options without relief of symptoms and was noted to have good digital perfusion and is well oriented 3     Assessment:     Ingrown toenail deformity right hallux lateral border with pain    Plan:     H&P condition reviewed and recommended correction explaining permanent procedure. Reviewed risk and at this time I infiltrated the right hallux 60 mg Xylocaine Marcaine mixture remove the lateral border and exposed matrix applying phenol 3 applications 30 seconds. Patient had sterile dressing reapplied and will be seen back to recheck

## 2016-03-14 ENCOUNTER — Telehealth: Payer: Self-pay | Admitting: *Deleted

## 2016-03-14 NOTE — Telephone Encounter (Signed)
Left message for patient at [phone] (Home #) to check to see how they were doing from their ingrown toenail procedure that was performed on Monday, February 22, 2016. Waiting for a response.

## 2016-03-19 ENCOUNTER — Inpatient Hospital Stay (HOSPITAL_COMMUNITY)
Admission: EM | Admit: 2016-03-19 | Discharge: 2016-03-24 | DRG: 392 | Disposition: A | Discharge status: Home / Self Care | Payer: 59 | Attending: Internal Medicine | Admitting: Internal Medicine

## 2016-03-19 ENCOUNTER — Emergency Department (HOSPITAL_COMMUNITY): Payer: 59

## 2016-03-19 DIAGNOSIS — R1031 Right lower quadrant pain: Secondary | ICD-10-CM | POA: Diagnosis not present

## 2016-03-19 DIAGNOSIS — K8689 Other specified diseases of pancreas: Secondary | ICD-10-CM

## 2016-03-19 DIAGNOSIS — I5032 Chronic diastolic (congestive) heart failure: Secondary | ICD-10-CM

## 2016-03-19 DIAGNOSIS — E119 Type 2 diabetes mellitus without complications: Secondary | ICD-10-CM

## 2016-03-19 DIAGNOSIS — K219 Gastro-esophageal reflux disease without esophagitis: Secondary | ICD-10-CM | POA: Diagnosis present

## 2016-03-19 DIAGNOSIS — K5732 Diverticulitis of large intestine without perforation or abscess without bleeding: Secondary | ICD-10-CM | POA: Diagnosis not present

## 2016-03-19 DIAGNOSIS — I10 Essential (primary) hypertension: Secondary | ICD-10-CM | POA: Diagnosis not present

## 2016-03-19 DIAGNOSIS — D72829 Elevated white blood cell count, unspecified: Secondary | ICD-10-CM

## 2016-03-19 DIAGNOSIS — Z888 Allergy status to other drugs, medicaments and biological substances status: Secondary | ICD-10-CM

## 2016-03-19 DIAGNOSIS — F419 Anxiety disorder, unspecified: Secondary | ICD-10-CM | POA: Diagnosis present

## 2016-03-19 DIAGNOSIS — G4733 Obstructive sleep apnea (adult) (pediatric): Secondary | ICD-10-CM | POA: Diagnosis present

## 2016-03-19 DIAGNOSIS — I959 Hypotension, unspecified: Secondary | ICD-10-CM | POA: Diagnosis present

## 2016-03-19 DIAGNOSIS — Z79899 Other long term (current) drug therapy: Secondary | ICD-10-CM

## 2016-03-19 DIAGNOSIS — K5792 Diverticulitis of intestine, part unspecified, without perforation or abscess without bleeding: Secondary | ICD-10-CM | POA: Diagnosis not present

## 2016-03-19 DIAGNOSIS — E785 Hyperlipidemia, unspecified: Secondary | ICD-10-CM | POA: Diagnosis present

## 2016-03-19 DIAGNOSIS — I110 Hypertensive heart disease with heart failure: Secondary | ICD-10-CM | POA: Diagnosis present

## 2016-03-19 DIAGNOSIS — K589 Irritable bowel syndrome without diarrhea: Secondary | ICD-10-CM | POA: Diagnosis present

## 2016-03-19 DIAGNOSIS — Z881 Allergy status to other antibiotic agents status: Secondary | ICD-10-CM

## 2016-03-19 DIAGNOSIS — Z833 Family history of diabetes mellitus: Secondary | ICD-10-CM

## 2016-03-19 DIAGNOSIS — E663 Overweight: Secondary | ICD-10-CM | POA: Diagnosis present

## 2016-03-19 DIAGNOSIS — N179 Acute kidney failure, unspecified: Secondary | ICD-10-CM

## 2016-03-19 DIAGNOSIS — Z882 Allergy status to sulfonamides status: Secondary | ICD-10-CM

## 2016-03-19 DIAGNOSIS — Z792 Long term (current) use of antibiotics: Secondary | ICD-10-CM

## 2016-03-19 DIAGNOSIS — Z8249 Family history of ischemic heart disease and other diseases of the circulatory system: Secondary | ICD-10-CM

## 2016-03-19 DIAGNOSIS — G43909 Migraine, unspecified, not intractable, without status migrainosus: Secondary | ICD-10-CM | POA: Diagnosis present

## 2016-03-19 DIAGNOSIS — Z6833 Body mass index (BMI) 33.0-33.9, adult: Secondary | ICD-10-CM

## 2016-03-19 DIAGNOSIS — Z880 Allergy status to penicillin: Secondary | ICD-10-CM

## 2016-03-19 HISTORY — DX: Failed or difficult intubation, initial encounter: T88.4XXA

## 2016-03-19 HISTORY — DX: Other complications of anesthesia, initial encounter: T88.59XA

## 2016-03-19 HISTORY — DX: Diverticulitis of intestine, part unspecified, without perforation or abscess without bleeding: K57.92

## 2016-03-19 HISTORY — DX: Adverse effect of unspecified anesthetic, initial encounter: T41.45XA

## 2016-03-19 LAB — COMPREHENSIVE METABOLIC PANEL
ALK PHOS: 86 U/L (ref 38–126)
ALT: 21 U/L (ref 14–54)
ANION GAP: 12 (ref 5–15)
AST: 20 U/L (ref 15–41)
Albumin: 3.7 g/dL (ref 3.5–5.0)
BILIRUBIN TOTAL: 1.1 mg/dL (ref 0.3–1.2)
BUN: 14 mg/dL (ref 6–20)
CALCIUM: 9.6 mg/dL (ref 8.9–10.3)
CO2: 24 mmol/L (ref 22–32)
Chloride: 103 mmol/L (ref 101–111)
Creatinine, Ser: 1.02 mg/dL — ABNORMAL HIGH (ref 0.44–1.00)
GFR, EST NON AFRICAN AMERICAN: 58 mL/min — AB (ref >60)
Glucose, Bld: 117 mg/dL — ABNORMAL HIGH (ref 65–99)
Potassium: 3.5 mmol/L (ref 3.5–5.1)
Sodium: 139 mmol/L (ref 135–145)
TOTAL PROTEIN: 7.3 g/dL (ref 6.5–8.1)

## 2016-03-19 LAB — URINALYSIS, ROUTINE W REFLEX MICROSCOPIC
BILIRUBIN URINE: NEGATIVE
Glucose, UA: NEGATIVE mg/dL
Ketones, ur: NEGATIVE mg/dL
Leukocytes, UA: NEGATIVE
NITRITE: NEGATIVE
Protein, ur: NEGATIVE mg/dL
SPECIFIC GRAVITY, URINE: 1.025 (ref 1.005–1.030)
pH: 6 (ref 5.0–8.0)

## 2016-03-19 LAB — URINE MICROSCOPIC-ADD ON

## 2016-03-19 LAB — CBC
HCT: 45.6 % (ref 36.0–46.0)
HEMOGLOBIN: 14.6 g/dL (ref 12.0–15.0)
MCH: 31.1 pg (ref 26.0–34.0)
MCHC: 32 g/dL (ref 30.0–36.0)
MCV: 97 fL (ref 78.0–100.0)
Platelets: 371 10*3/uL (ref 150–400)
RBC: 4.7 MIL/uL (ref 3.87–5.11)
RDW: 13.9 % (ref 11.5–15.5)
WBC: 13.9 10*3/uL — AB (ref 4.0–10.5)

## 2016-03-19 LAB — LIPASE, BLOOD: Lipase: 30 U/L (ref 11–51)

## 2016-03-19 MED ORDER — ALBUTEROL SULFATE HFA 108 (90 BASE) MCG/ACT IN AERS
1.0000 | INHALATION_SPRAY | Freq: Four times a day (QID) | RESPIRATORY_TRACT | Status: DC | PRN
Start: 2016-03-19 — End: 2016-03-19

## 2016-03-19 MED ORDER — MONTELUKAST SODIUM 10 MG PO TABS
10.0000 mg | ORAL_TABLET | Freq: Every day | ORAL | Status: DC
  Administered 2016-03-19 – 2016-03-23 (×5): 10 mg via ORAL
  Filled 2016-03-19 (×5): qty 1

## 2016-03-19 MED ORDER — ACETAMINOPHEN 325 MG PO TABS: 650.0000 mg | ORAL_TABLET | Freq: Four times a day (QID) | ORAL | Status: DC | PRN

## 2016-03-19 MED ORDER — SODIUM CHLORIDE 0.9 % IV BOLUS (SEPSIS)
1000.0000 mL | Freq: Once | INTRAVENOUS | Status: AC
  Administered 2016-03-19: 1000 mL via INTRAVENOUS

## 2016-03-19 MED ORDER — CIPROFLOXACIN IN D5W 400 MG/200ML IV SOLN
400.0000 mg | Freq: Two times a day (BID) | INTRAVENOUS | Status: DC
  Filled 2016-03-19: qty 200

## 2016-03-19 MED ORDER — AMLODIPINE BESYLATE 5 MG PO TABS: 5.0000 mg | ORAL_TABLET | Freq: Every day | ORAL | Status: DC

## 2016-03-19 MED ORDER — FUROSEMIDE 40 MG PO TABS: 40.0000 mg | ORAL_TABLET | Freq: Every day | ORAL | Status: DC

## 2016-03-19 MED ORDER — MORPHINE SULFATE (PF) 2 MG/ML IV SOLN
2.0000 mg | INTRAVENOUS | Status: DC | PRN
  Administered 2016-03-19 – 2016-03-23 (×9): 2 mg via INTRAVENOUS
  Filled 2016-03-19 (×10): qty 1

## 2016-03-19 MED ORDER — MAGNESIUM CITRATE PO SOLN: 1.0000 | Freq: Once | ORAL | Status: DC | PRN

## 2016-03-19 MED ORDER — ACETAMINOPHEN 650 MG RE SUPP: 650.0000 mg | Freq: Four times a day (QID) | RECTAL | Status: DC | PRN

## 2016-03-19 MED ORDER — QUINAPRIL HCL 10 MG PO TABS
40.0000 mg | ORAL_TABLET | Freq: Every day | ORAL | Status: DC
  Filled 2016-03-19: qty 4

## 2016-03-19 MED ORDER — CIPROFLOXACIN IN D5W 400 MG/200ML IV SOLN
400.0000 mg | Freq: Two times a day (BID) | INTRAVENOUS | Status: DC
  Administered 2016-03-19 – 2016-03-24 (×10): 400 mg via INTRAVENOUS
  Filled 2016-03-19 (×11): qty 200

## 2016-03-19 MED ORDER — ROSUVASTATIN CALCIUM 10 MG PO TABS: 10.0000 mg | ORAL_TABLET | Freq: Every day | ORAL | Status: DC

## 2016-03-19 MED ORDER — METRONIDAZOLE IN NACL 5-0.79 MG/ML-% IV SOLN
500.0000 mg | Freq: Once | INTRAVENOUS | Status: AC
  Administered 2016-03-19: 500 mg via INTRAVENOUS

## 2016-03-19 MED ORDER — ONDANSETRON HCL 4 MG/2ML IJ SOLN
4.0000 mg | Freq: Four times a day (QID) | INTRAMUSCULAR | Status: DC | PRN
  Administered 2016-03-19 – 2016-03-23 (×7): 4 mg via INTRAVENOUS
  Filled 2016-03-19 (×7): qty 2

## 2016-03-19 MED ORDER — MORPHINE SULFATE (PF) 4 MG/ML IV SOLN
4.0000 mg | Freq: Once | INTRAVENOUS | Status: AC
  Administered 2016-03-19: 4 mg via INTRAVENOUS
  Filled 2016-03-19: qty 1

## 2016-03-19 MED ORDER — ALBUTEROL SULFATE (2.5 MG/3ML) 0.083% IN NEBU: 2.5000 mg | INHALATION_SOLUTION | Freq: Four times a day (QID) | RESPIRATORY_TRACT | Status: DC | PRN

## 2016-03-19 MED ORDER — MOMETASONE FURO-FORMOTEROL FUM 200-5 MCG/ACT IN AERO
2.0000 | INHALATION_SPRAY | Freq: Two times a day (BID) | RESPIRATORY_TRACT | Status: DC | PRN
  Filled 2016-03-19: qty 8.8

## 2016-03-19 MED ORDER — CLONAZEPAM 0.5 MG PO TABS
0.5000 mg | ORAL_TABLET | Freq: Every day | ORAL | Status: DC
  Administered 2016-03-19 – 2016-03-23 (×5): 0.5 mg via ORAL
  Filled 2016-03-19 (×5): qty 1

## 2016-03-19 MED ORDER — ENOXAPARIN SODIUM 40 MG/0.4ML ~~LOC~~ SOLN
40.0000 mg | SUBCUTANEOUS | Status: DC
  Administered 2016-03-19 – 2016-03-23 (×5): 40 mg via SUBCUTANEOUS
  Filled 2016-03-19 (×5): qty 0.4

## 2016-03-19 MED ORDER — ORAL CARE MOUTH RINSE
15.0000 mL | Freq: Two times a day (BID) | OROMUCOSAL | Status: DC
  Administered 2016-03-19 – 2016-03-23 (×8): 15 mL via OROMUCOSAL

## 2016-03-19 MED ORDER — BISACODYL 10 MG RE SUPP: 10.0000 mg | Freq: Every day | RECTAL | Status: DC | PRN

## 2016-03-19 MED ORDER — ONDANSETRON HCL 4 MG PO TABS: 4.0000 mg | ORAL_TABLET | Freq: Four times a day (QID) | ORAL | Status: DC | PRN

## 2016-03-19 MED ORDER — ONDANSETRON HCL 4 MG/2ML IJ SOLN
4.0000 mg | Freq: Once | INTRAMUSCULAR | Status: AC
  Administered 2016-03-19: 4 mg via INTRAVENOUS
  Filled 2016-03-19: qty 2

## 2016-03-19 MED ORDER — ROSUVASTATIN CALCIUM 10 MG PO TABS
10.0000 mg | ORAL_TABLET | Freq: Every day | ORAL | Status: DC
  Administered 2016-03-20 – 2016-03-24 (×5): 10 mg via ORAL
  Filled 2016-03-19 (×6): qty 1

## 2016-03-19 MED ORDER — FAMOTIDINE IN NACL 20-0.9 MG/50ML-% IV SOLN
20.0000 mg | Freq: Two times a day (BID) | INTRAVENOUS | Status: DC
  Administered 2016-03-19 – 2016-03-23 (×8): 20 mg via INTRAVENOUS
  Filled 2016-03-19 (×9): qty 50

## 2016-03-19 MED ORDER — MOMETASONE FURO-FORMOTEROL FUM 200-5 MCG/ACT IN AERO
2.0000 | INHALATION_SPRAY | Freq: Two times a day (BID) | RESPIRATORY_TRACT | Status: DC
  Filled 2016-03-19: qty 8.8

## 2016-03-19 MED ORDER — SODIUM CHLORIDE 0.9 % IV SOLN
INTRAVENOUS | Status: DC
  Administered 2016-03-19: 1 mL via INTRAVENOUS
  Administered 2016-03-20 – 2016-03-23 (×5): via INTRAVENOUS

## 2016-03-19 MED ORDER — SODIUM CHLORIDE 0.9 % IV SOLN
INTRAVENOUS | Status: DC
  Administered 2016-03-19 – 2016-03-24 (×3): via INTRAVENOUS

## 2016-03-19 MED ORDER — QUINAPRIL HCL 10 MG PO TABS: 40.0000 mg | ORAL_TABLET | Freq: Every day | ORAL | Status: DC

## 2016-03-19 MED ORDER — CIPROFLOXACIN IN D5W 400 MG/200ML IV SOLN
400.0000 mg | Freq: Once | INTRAVENOUS | Status: AC
  Administered 2016-03-19: 400 mg via INTRAVENOUS
  Filled 2016-03-19: qty 200

## 2016-03-19 MED ORDER — METRONIDAZOLE IN NACL 5-0.79 MG/ML-% IV SOLN
500.0000 mg | Freq: Three times a day (TID) | INTRAVENOUS | Status: DC
  Administered 2016-03-19 – 2016-03-23 (×12): 500 mg via INTRAVENOUS
  Filled 2016-03-19 (×14): qty 100

## 2016-03-19 MED ORDER — SENNOSIDES-DOCUSATE SODIUM 8.6-50 MG PO TABS: 1.0000 | ORAL_TABLET | Freq: Every evening | ORAL | Status: DC | PRN

## 2016-03-19 MED ORDER — DEXTROSE-NACL 5-0.45 % IV SOLN
INTRAVENOUS | Status: AC
  Administered 2016-03-19: 12:00:00 via INTRAVENOUS

## 2016-03-19 NOTE — Progress Notes (Signed)
Patient on Home CPAP unit and tolerating well. No assistance needed at this time.

## 2016-03-19 NOTE — ED Notes (Signed)
Patient transported to CT 

## 2016-03-19 NOTE — ED Provider Notes (Signed)
[facility] DEPT Provider Note   CSN: [id number] Arrival date & time: 03/19/16  [id number]     History   Chief Complaint Chief Complaint  Patient presents with  . Abdominal Pain    HPI [name] is a 60 y.o. female.  The history is provided by the patient.  Abdominal Pain   This is a new problem. Episode onset: last saturday. The problem occurs constantly. The problem has been gradually worsening. The pain is located in the RLQ (going towards the left and up towards the ribs). The pain is moderate. Associated symptoms include diarrhea and dysuria. Pertinent negatives include fever, nausea and vomiting. The symptoms are aggravated by eating. Nothing relieves the symptoms.  Pt was started on doxycycline empirically for possible diverticulitis by the PA at Dr Buccinis office.  Instructed to come to the ED if it got worse.  Past Medical History:  Diagnosis Date  . Diabetes mellitus   . Hyperlipidemia   . Hypertension   . Migraines 2013    Patient Active Problem List   Diagnosis Date Noted  . Chest pain 08/31/2013  . CHEST PAIN-UNSPECIFIED 08/04/2009  . THYROID NODULE, LEFT 07/30/2009  . DIABETES MELLITUS, TYPE II 07/30/2009  . HYPERLIPIDEMIA 07/30/2009  . SLEEP APNEA, OBSTRUCTIVE 07/30/2009  . HYPERTENSION 07/30/2009  . GERD 07/30/2009  . IRRITABLE BOWEL SYNDROME 07/30/2009    Past Surgical History:  Procedure Laterality Date  . ABDOMINAL HYSTERECTOMY    . GALLBLADDER SURGERY    . KNEE ARTHROSCOPY    . lymph nodectomy    . NASAL SINUS SURGERY    . SPLENIC ARTERY EMBOLIZATION     due to Aneurysm  . TONSILLECTOMY      OB History    No data available       Home Medications    Prior to Admission medications   Medication Sig Start Date End Date Taking? Authorizing Provider  albuterol (VENTOLIN HFA) 108 (90 BASE) MCG/ACT inhaler Inhale 1 puff into the lungs as needed. 02/08/12   Historical Provider, MD  amLODipine (NORVASC) 5 MG tablet Take 1 tablet by  mouth daily. 08/26/13   Historical Provider, MD  clobetasol cream (TEMOVATE) [id number] % Apply 1 application topically 2 (two) times daily.    Historical Provider, MD  clonazePAM (KLONOPIN) 0.5 MG tablet Take 1 tablet by mouth at bedtime. For sleep 08/16/13   Historical Provider, MD  CRESTOR 20 MG tablet Take 10 mg by mouth daily. 08/07/13   Historical Provider, MD  doxycycline (VIBRAMYCIN) 100 MG capsule 1 capsule 2 (two) times daily. 02/18/15   Historical Provider, MD  EPINEPHrine (EPIPEN 2-PAK) 0.3 mg/0.3 mL IJ SOAJ injection Inject 0.3 mLs as directed as needed. 02/14/13   Historical Provider, MD  esomeprazole (NEXIUM) 40 MG capsule Take 1 tablet by mouth daily. 02/07/12   Historical Provider, MD  Fluticasone-Salmeterol (ADVAIR DISKUS) 500-50 MCG/DOSE AEPB Inhale 500 mcg into the lungs as needed. 02/08/12   Historical Provider, MD  furosemide (LASIX) 40 MG tablet Take 1 tablet by mouth daily. 08/07/13   Historical Provider, MD  HYDROcodone-acetaminophen (NORCO/VICODIN) 5-325 MG tablet Take 1 tablet by mouth as needed. 02/18/15   Historical Provider, MD  methocarbamol (ROBAXIN) 500 MG tablet Take 1 tablet by mouth 3 (three) times daily as needed. 06/05/13   Historical Provider, MD  montelukast (SINGULAIR) 10 MG tablet Take 1 tablet by mouth as needed. 02/21/15   Historical Provider, MD  Multiple Vitamin (MULTIVITAMIN WITH MINERALS) TABS tablet Take 1 tablet by mouth daily.  Historical Provider, MD  quinapril (ACCUPRIL) 40 MG tablet Take 1 tablet by mouth daily. 08/07/13   Historical Provider, MD    Family History Family History  Problem Relation Age of Onset  . Diabetes Father   . Hypertension Father     Social History Social History  Substance Use Topics  . Smoking status: Never Smoker  . Smokeless tobacco: Not on file  . Alcohol use No     Allergies   Aspirin; Demerol [meperidine]; Ibuprofen; Meperidine hcl; Nsaids; Codeine; Iohexol; Mupirocin; Neosporin [neomycin-bacitracin zn-polymyx];  Sulfonamide derivatives; Tramadol; Cephalosporins; and Penicillins   Review of Systems Review of Systems  Constitutional: Negative for fever.  Gastrointestinal: Positive for abdominal pain and diarrhea. Negative for nausea and vomiting.  Genitourinary: Positive for dysuria.  All other systems reviewed and are negative.    Physical Exam Updated Vital Signs BP 105/67   Pulse 80   Temp 97.9 F (36.6 C) (Oral)   Resp 18   SpO2 94%   Physical Exam  Constitutional: She appears well-developed and well-nourished. No distress.  HENT:  Head: Normocephalic and atraumatic.  Right Ear: External ear normal.  Left Ear: External ear normal.  Eyes: Conjunctivae are normal. Right eye exhibits no discharge. Left eye exhibits no discharge. No scleral icterus.  Neck: Neck supple. No tracheal deviation present.  Cardiovascular: Normal rate, regular rhythm and intact distal pulses.   Pulmonary/Chest: Effort normal and breath sounds normal. No stridor. No respiratory distress. She has no wheezes. She has no rales.  Abdominal: Soft. Bowel sounds are normal. She exhibits no distension. There is tenderness in the right lower quadrant and left lower quadrant. There is guarding. There is no rigidity and no rebound. No hernia.  Musculoskeletal: She exhibits no edema or tenderness.  Neurological: She is alert. She has normal strength. No cranial nerve deficit (no facial droop, extraocular movements intact, no slurred speech) or sensory deficit. She exhibits normal muscle tone. She displays no seizure activity. Coordination normal.  Skin: Skin is warm and dry. No rash noted.  Psychiatric: She has a normal mood and affect.  Nursing note and vitals reviewed.    ED Treatments / Results  Labs (all labs ordered are listed, but only abnormal results are displayed) Labs Reviewed  COMPREHENSIVE METABOLIC PANEL - Abnormal; Notable for the following:       Result Value   Glucose, Bld 117 (*)    Creatinine, Ser  1.02 (*)    GFR calc non Af Amer 58 (*)    All other components within normal limits  CBC - Abnormal; Notable for the following:    WBC 13.9 (*)    All other components within normal limits  URINALYSIS, ROUTINE W REFLEX MICROSCOPIC (NOT AT [facility]) - Abnormal; Notable for the following:    Hgb urine dipstick LARGE (*)    All other components within normal limits  URINE MICROSCOPIC-ADD ON - Abnormal; Notable for the following:    Squamous Epithelial / LPF 0-5 (*)    Bacteria, UA FEW (*)    All other components within normal limits  LIPASE, BLOOD    Radiology Ct Abdomen Pelvis Wo Contrast  Result Date: 03/19/2016 CLINICAL DATA:  Lower abdominal pain and nausea over the last week. Clinical suspicion of diverticulitis or appendicitis. EXAM: CT ABDOMEN AND PELVIS WITHOUT CONTRAST TECHNIQUE: Multidetector CT imaging of the abdomen and pelvis was performed following the standard protocol without IV contrast. COMPARISON:  02/27/2014 FINDINGS: Lower chest: Normal Hepatobiliary: Liver parenchyma is normal without contrast.  Previous cholecystectomy. Pancreas: Normal Spleen: Previous splenectomy. Adrenals/Urinary Tract: Adrenal glands are normal. Kidneys are normal. No cyst, mass, stone or hydronephrosis. Mild fullness of the right renal collecting system is chronic and normal. Stomach/Bowel: Diverticulosis of the sigmoid colon. Diverticulitis in the sigmoid region to the right of midline with some inflammatory change of the fat but no evidence of abscess, free fluid or free air. I do not see an appendix, either normal or abnormal. Vascular/Lymphatic: Aortic atherosclerosis. No aneurysm. The IVC is normal. No retroperitoneal adenopathy or mass. Reproductive: Previous hysterectomy. Other: None Musculoskeletal: Ordinary mild lower lumbar degenerative changes. IMPRESSION: Acute diverticulitis in the sigmoid colon region. Edema in the fat, but no evidence of abscess, free fluid or free air. Electronically Signed    By: Nelson Chimes M.D.   On: 03/19/2016 09:17    Procedures Procedures (including critical care time)  Medications Ordered in ED Medications  sodium chloride 0.9 % bolus 1,000 mL (0 mLs Intravenous Stopped 03/19/16 0931)    And  0.9 %  sodium chloride infusion ( Intravenous New Bag/Given 03/19/16 0749)  ondansetron (ZOFRAN) injection 4 mg (4 mg Intravenous Given 03/19/16 0751)  morphine 4 MG/ML injection 4 mg (4 mg Intravenous Given 03/19/16 0754)     Initial Impression / Assessment and Plan / ED Course  I have reviewed the triage vital signs and the nursing notes.   Pertinent labs & imaging results that were available during my care of the patient were reviewed by me and considered in my medical decision making (see chart for details).  Clinical Course   Laboratory tests are notable for an elevated white blood cell count.  Urinalysis shows hematuria. CT scan does not show any abnormalities in the kidney or bladder. Would recommend repeat UA after her diverticulitis infection has resolved. CT scan does confirm acute diverticulitis without perforation or abscess. The patient has been taking oral doxycycline as an outpatient. Her on IV Cipro and Flagyl. Because of her worsening symptoms, I will consult with the medical service for admission.  Final Clinical Impressions(s) / ED Diagnoses   Final diagnoses:  Acute diverticulitis of intestine    New Prescriptions New Prescriptions   No medications on file     Dorie Rank, MD 03/19/16 1022

## 2016-03-19 NOTE — Consult Note (Signed)
[facility] Consult Note  Referring Provider: No ref. provider found Primary Care Physician:  Tivis Ringer, MD Primary Gastroenterologist:  Dr.  Laurel Dimmer Complaint: Abdominal pain HPI: [name] is an 60 y.o. white female  who presents with a 5-6 day history of gradually worsening abdominal pain and tenderness and malaise. She was seen in the office yesterday but air PA with clinical diagnosis of diverticulitis and started on doxycycline. However pain worsened through the night and she came to the emergency room as recommended. Skin showed left-sided diverticulitis with edema of the surrounding fat and no abscess or air in the wall. She was started on IV Cipro and Flagyl on a clear liquid diet. She did have diverticulosis on colonoscopy in 2012. She has a white blood cell count of 13,000.  Past Medical History:  Diagnosis Date  . Diabetes mellitus   . Hyperlipidemia   . Hypertension   . Migraines 2013    Past Surgical History:  Procedure Laterality Date  . ABDOMINAL HYSTERECTOMY    . GALLBLADDER SURGERY    . KNEE ARTHROSCOPY    . lymph nodectomy    . NASAL SINUS SURGERY    . SPLENIC ARTERY EMBOLIZATION     due to Aneurysm  . TONSILLECTOMY      Medications Prior to Admission  Medication Sig Dispense Refill  . albuterol (VENTOLIN HFA) 108 (90 BASE) MCG/ACT inhaler Inhale 1 puff into the lungs as needed.    Marland Kitchen amLODipine (NORVASC) 5 MG tablet Take 5 mg by mouth daily.     . clobetasol cream (TEMOVATE) 1.61 % Apply 1 application topically 2 (two) times daily.    . clonazePAM (KLONOPIN) 0.5 MG tablet Take 0.5 mg by mouth at bedtime. For sleep    . CRESTOR 20 MG tablet Take 10 mg by mouth daily.    Marland Kitchen doxycycline (VIBRAMYCIN) 100 MG capsule Take 100 mg by mouth 2 (two) times daily.     Marland Kitchen EPINEPHrine (EPIPEN 2-PAK) 0.3 mg/0.3 mL IJ SOAJ injection Inject 0.3 mLs as directed as needed.    Marland Kitchen esomeprazole (NEXIUM) 40 MG capsule Take 40 mg by mouth daily.     .  Fluticasone-Salmeterol (ADVAIR DISKUS) 500-50 MCG/DOSE AEPB Inhale 500 mcg into the lungs as needed (shortness of breath).     . furosemide (LASIX) 40 MG tablet Take 40 mg by mouth daily.     Marland Kitchen HYDROcodone-acetaminophen (NORCO/VICODIN) 5-325 MG tablet Take 1 tablet by mouth as needed for moderate pain.     . methocarbamol (ROBAXIN) 500 MG tablet Take 500 mg by mouth 3 (three) times daily as needed for muscle spasms.     . montelukast (SINGULAIR) 10 MG tablet Take 10 mg by mouth at bedtime.     . Multiple Vitamin (MULTIVITAMIN WITH MINERALS) TABS tablet Take 1 tablet by mouth daily.    . quinapril (ACCUPRIL) 40 MG tablet Take 40 mg by mouth daily.     . ranitidine (ZANTAC) 150 MG tablet Take 150 mg by mouth at bedtime.      Allergies:  Allergies  Allergen Reactions  . Aspirin Anaphylaxis  . Demerol [Meperidine] Anaphylaxis  . Ibuprofen Anaphylaxis  . Meperidine Hcl Anaphylaxis  . Nsaids Anaphylaxis  . Codeine Nausea And Vomiting  . Iohexol Other (See Comments)     Desc: ELEVATION OF B.P, PASSED OUT, PT. NEEDS PRE-MEDS. FOR IODINE CONTRAST   . Mupirocin Other (See Comments)    Makes more inflammed  . Neosporin [Neomycin-Bacitracin Zn-Polymyx] Other (See Comments)  Makes more inflammed  . Sulfonamide Derivatives Hives  . Tramadol Nausea And Vomiting  . Cephalosporins Rash  . Penicillins Rash    Family History  Problem Relation Age of Onset  . Diabetes Father   . Hypertension Father     Social History:  reports that she has never smoked. She does not have any smokeless tobacco history on file. She reports that she does not drink alcohol. Her drug history is not on file.  Review of Systems: negative except As above   Blood pressure (!) 109/59, pulse 87, temperature 98 F (36.7 C), temperature source Oral, resp. rate 19, weight 104.3 kg (229 lb 15 oz), SpO2 100 %. Head: Normocephalic, without obvious abnormality, atraumatic Neck: no adenopathy, no carotid bruit, no JVD,  supple, symmetrical, trachea midline and thyroid not enlarged, symmetric, no tenderness/mass/nodules Resp: clear to auscultation bilaterally Cardio: regular rate and rhythm, S1, S2 normal, no murmur, click, rub or gallop GI: Abdomen soft diffusely tender in the lower abdomen. No rebound Extremities: extremities normal, atraumatic, no cyanosis or edema  Results for orders placed or performed during the hospital encounter of 03/19/16 (from the past 48 hour(s))  Urinalysis, Routine w reflex microscopic     Status: Abnormal   Collection Time: 03/19/16  6:48 AM  Result Value Ref Range   Color, Urine YELLOW YELLOW   APPearance CLEAR CLEAR   Specific Gravity, Urine 1.025 1.005 - 1.030   pH 6.0 5.0 - 8.0   Glucose, UA NEGATIVE NEGATIVE mg/dL   Hgb urine dipstick LARGE (A) NEGATIVE   Bilirubin Urine NEGATIVE NEGATIVE   Ketones, ur NEGATIVE NEGATIVE mg/dL   Protein, ur NEGATIVE NEGATIVE mg/dL   Nitrite NEGATIVE NEGATIVE   Leukocytes, UA NEGATIVE NEGATIVE  Urine microscopic-add on     Status: Abnormal   Collection Time: 03/19/16  6:48 AM  Result Value Ref Range   Squamous Epithelial / LPF 0-5 (A) NONE SEEN   WBC, UA 0-5 0 - 5 WBC/hpf   RBC / HPF 6-30 0 - 5 RBC/hpf   Bacteria, UA FEW (A) NONE SEEN   Urine-Other MUCOUS PRESENT   Lipase, blood     Status: None   Collection Time: 03/19/16  7:12 AM  Result Value Ref Range   Lipase 30 11 - 51 U/L  Comprehensive metabolic panel     Status: Abnormal   Collection Time: 03/19/16  7:12 AM  Result Value Ref Range   Sodium 139 135 - 145 mmol/L   Potassium 3.5 3.5 - 5.1 mmol/L   Chloride 103 101 - 111 mmol/L   CO2 24 22 - 32 mmol/L   Glucose, Bld 117 (H) 65 - 99 mg/dL   BUN 14 6 - 20 mg/dL   Creatinine, Ser 1.02 (H) 0.44 - 1.00 mg/dL   Calcium 9.6 8.9 - 10.3 mg/dL   Total Protein 7.3 6.5 - 8.1 g/dL   Albumin 3.7 3.5 - 5.0 g/dL   AST 20 15 - 41 U/L   ALT 21 14 - 54 U/L   Alkaline Phosphatase 86 38 - 126 U/L   Total Bilirubin 1.1 0.3 - 1.2  mg/dL   GFR calc non Af Amer 58 (L) >60 mL/min   GFR calc Af Amer >60 >60 mL/min    Comment: (NOTE) The eGFR has been calculated using the CKD EPI equation. This calculation has not been validated in all clinical situations. eGFR's persistently <60 mL/min signify possible Chronic Kidney Disease.    Anion gap 12 5 - 15  CBC     Status: Abnormal   Collection Time: 03/19/16  7:12 AM  Result Value Ref Range   WBC 13.9 (H) 4.0 - 10.5 K/uL   RBC 4.70 3.87 - 5.11 MIL/uL   Hemoglobin 14.6 12.0 - 15.0 g/dL   HCT 45.6 36.0 - 46.0 %   MCV 97.0 78.0 - 100.0 fL   MCH 31.1 26.0 - 34.0 pg   MCHC 32.0 30.0 - 36.0 g/dL   RDW 13.9 11.5 - 15.5 %   Platelets 371 150 - 400 K/uL   Ct Abdomen Pelvis Wo Contrast  Result Date: 03/19/2016 CLINICAL DATA:  Lower abdominal pain and nausea over the last week. Clinical suspicion of diverticulitis or appendicitis. EXAM: CT ABDOMEN AND PELVIS WITHOUT CONTRAST TECHNIQUE: Multidetector CT imaging of the abdomen and pelvis was performed following the standard protocol without IV contrast. COMPARISON:  02/27/2014 FINDINGS: Lower chest: Normal Hepatobiliary: Liver parenchyma is normal without contrast. Previous cholecystectomy. Pancreas: Normal Spleen: Previous splenectomy. Adrenals/Urinary Tract: Adrenal glands are normal. Kidneys are normal. No cyst, mass, stone or hydronephrosis. Mild fullness of the right renal collecting system is chronic and normal. Stomach/Bowel: Diverticulosis of the sigmoid colon. Diverticulitis in the sigmoid region to the right of midline with some inflammatory change of the fat but no evidence of abscess, free fluid or free air. I do not see an appendix, either normal or abnormal. Vascular/Lymphatic: Aortic atherosclerosis. No aneurysm. The IVC is normal. No retroperitoneal adenopathy or mass. Reproductive: Previous hysterectomy. Other: None Musculoskeletal: Ordinary mild lower lumbar degenerative changes. IMPRESSION: Acute diverticulitis in the  sigmoid colon region. Edema in the fat, but no evidence of abscess, free fluid or free air. Electronically Signed   By: Nelson Chimes M.D.   On: 03/19/2016 09:17    Assessment: Acute diverticulitis, first episode Plan:  IV antibiotics temporary bowel rest, follow clinical course repeat CT scan if worsening or no definite improvement over the next few days. We'll follow with you. Johnchristopher Sarvis C 03/19/2016, 2:29 PM  Pager [phone] If no answer or after 5 PM call [phone]

## 2016-03-19 NOTE — ED Triage Notes (Signed)
Patient arrives with complaint of chronic abdominal pain associated with diverticulosis. States she has had similar pain and was seen by GI yesterday. This AM during the night the pain became intense and she had been advised by her MD to come for evaluation if that occurred. Currently endorses RLQ pain which extends to RUQ and across to LUQ. Endorses diarrhea and mild nausea. Denies vomiting. Possible fever over the last week per patient, but never measured.

## 2016-03-19 NOTE — Progress Notes (Signed)
Pharmacy Antibiotic Note  [name] is a 60 y.o. female admitted on 03/19/2016 with acute diverticulitis.  She has a history of chronic diverticulosis.  Pharmacy has been consulted for Cipro dosing.  She is afebrile and her WBC is mildly elevated.  Renal function is at baseline with estimated CrCL 65 ml/min.  Plan: - Cipro 400 mg IV Q12H - Pharmacy will sign off as dosage adjustment is likely unnecessary.  Thank you for the consult!  Weight: 229 lb 15 oz (104.3 kg)  Temp (24hrs), Avg:98 F (36.7 C), Min:97.9 F (36.6 C), Max:98 F (36.7 C)   Recent Labs Lab 03/19/16 0712  WBC 13.9*  CREATININE 1.02*    Estimated Creatinine Clearance: 72.9 mL/min (by C-G formula based on SCr of 1.02 mg/dL (H)).    Allergies  Allergen Reactions  . Aspirin Anaphylaxis  . Demerol [Meperidine] Anaphylaxis  . Ibuprofen Anaphylaxis  . Meperidine Hcl Anaphylaxis  . Nsaids Anaphylaxis  . Codeine Nausea And Vomiting  . Iohexol Other (See Comments)     Desc: ELEVATION OF B.P, PASSED OUT, PT. NEEDS PRE-MEDS. FOR IODINE CONTRAST   . Mupirocin Other (See Comments)    Makes more inflammed  . Neosporin [Neomycin-Bacitracin Zn-Polymyx] Other (See Comments)    Makes more inflammed  . Sulfonamide Derivatives Hives  . Tramadol Nausea And Vomiting  . Cephalosporins Rash  . Penicillins Rash    Antimicrobials this admission: Cipro 10/7 >> Flagyl 10/7 >>  Dose adjustments this admission: N/A  Microbiology results: N/A   Gertha Lichtenberg D. Mina Marble, PharmD, BCPS Pager:  [phone] 03/19/2016, 12:14 PM

## 2016-03-19 NOTE — H&P (Signed)
History and Physical    LEXIA BALMES [id number] DOB: 07-04-1955 DOA: 03/19/2016   PCP: Tivis Ringer, MD   Patient coming from:  Home SNF  Rehab   Chief Complaint:  HPI: HALAYNA BANDT is a 60 y.o. female with medical history significant for DM, diverticulosis, presenting with 1 week history of RLQ pain, worsening since, now extending to the RUQ and LUQ on presentation, associated with diarrhea and dysuria. She denies hematochezia. She reports nausea without vomiting. She had subjective fevers earlier this week, but not on presentation . Symptoms aggravated by oral intake. She admits to eating sunflower seeds and chocolate milk 5 days prior, and spicy gravy 2 days before. She was seen on 10/6 at Dr. Osborn Coho office , gived Doxycycline and instructed to present to the ED if symptoms worsened. Denies   Denies any respiratory complaints. Denies any chest pain or palpitations. Denies lower extremity swelling. Denies heartburn Denies abnormal skin rashes, or neuropathy. Denies any bleeding issues such as epistaxis, hematemesis, hematuria . Ambulating without difficulty.  ED Course:  BP 107/67   Pulse 87   Temp 97.9 F (36.6 C) (Oral)   Resp 18   SpO2 100%    CMET is normal  WBC 13.9 otherwise normal  Glu 117  UA neg for nitrite or leukocytes   CT abd and pelvis : Acute diverticulitis in the sigmoid colon region. Edema in the fat, but no evidence of abscess, free fluid or free air. IVF at 125 cc NS   Review of Systems: As per HPI otherwise 10 point review of systems negative.   Past Medical History:  Diagnosis Date  . Diabetes mellitus   . Hyperlipidemia   . Hypertension   . Migraines 2013    Past Surgical History:  Procedure Laterality Date  . ABDOMINAL HYSTERECTOMY    . GALLBLADDER SURGERY    . KNEE ARTHROSCOPY    . lymph nodectomy    . NASAL SINUS SURGERY    . SPLENIC ARTERY EMBOLIZATION     due to Aneurysm  . TONSILLECTOMY      Social History Social  History   Social History  . Marital status: Married    Spouse name: N/A  . Number of children: N/A  . Years of education: N/A   Occupational History  . Not on file.   Social History Main Topics  . Smoking status: Never Smoker  . Smokeless tobacco: Not on file  . Alcohol use No  . Drug use: Unknown  . Sexual activity: Yes    Birth control/ protection: Surgical     Comment: hysterectomy   Other Topics Concern  . Not on file   Social History Narrative  . No narrative on file     Allergies  Allergen Reactions  . Aspirin Anaphylaxis  . Demerol [Meperidine] Anaphylaxis  . Ibuprofen Anaphylaxis  . Meperidine Hcl Anaphylaxis  . Nsaids Anaphylaxis  . Codeine Nausea And Vomiting  . Iohexol Other (See Comments)     Desc: ELEVATION OF B.P, PASSED OUT, PT. NEEDS PRE-MEDS. FOR IODINE CONTRAST   . Mupirocin Other (See Comments)    Makes more inflammed  . Neosporin [Neomycin-Bacitracin Zn-Polymyx] Other (See Comments)    Makes more inflammed  . Sulfonamide Derivatives Hives  . Tramadol Nausea And Vomiting  . Cephalosporins Rash  . Penicillins Rash    Family History  Problem Relation Age of Onset  . Diabetes Father   . Hypertension Father       Prior to  Admission medications   Medication Sig Start Date End Date Taking? Authorizing Provider  albuterol (VENTOLIN HFA) 108 (90 BASE) MCG/ACT inhaler Inhale 1 puff into the lungs as needed. 02/08/12  Yes Historical Provider, MD  amLODipine (NORVASC) 5 MG tablet Take 5 mg by mouth daily.  08/26/13  Yes Historical Provider, MD  clobetasol cream (TEMOVATE) [id number] % Apply 1 application topically 2 (two) times daily.   Yes Historical Provider, MD  clonazePAM (KLONOPIN) 0.5 MG tablet Take 0.5 mg by mouth at bedtime. For sleep 08/16/13  Yes Historical Provider, MD  CRESTOR 20 MG tablet Take 10 mg by mouth daily. 08/07/13  Yes Historical Provider, MD  doxycycline (VIBRAMYCIN) 100 MG capsule Take 100 mg by mouth 2 (two) times daily.  02/18/15   Yes Historical Provider, MD  EPINEPHrine (EPIPEN 2-PAK) 0.3 mg/0.3 mL IJ SOAJ injection Inject 0.3 mLs as directed as needed. 02/14/13  Yes Historical Provider, MD  esomeprazole (NEXIUM) 40 MG capsule Take 40 mg by mouth daily.  02/07/12  Yes Historical Provider, MD  Fluticasone-Salmeterol (ADVAIR DISKUS) 500-50 MCG/DOSE AEPB Inhale 500 mcg into the lungs as needed (shortness of breath).  02/08/12  Yes Historical Provider, MD  furosemide (LASIX) 40 MG tablet Take 40 mg by mouth daily.  08/07/13  Yes Historical Provider, MD  HYDROcodone-acetaminophen (NORCO/VICODIN) 5-325 MG tablet Take 1 tablet by mouth as needed for moderate pain.  02/18/15  Yes Historical Provider, MD  methocarbamol (ROBAXIN) 500 MG tablet Take 500 mg by mouth 3 (three) times daily as needed for muscle spasms.  06/05/13  Yes Historical Provider, MD  montelukast (SINGULAIR) 10 MG tablet Take 10 mg by mouth at bedtime.  02/21/15  Yes Historical Provider, MD  Multiple Vitamin (MULTIVITAMIN WITH MINERALS) TABS tablet Take 1 tablet by mouth daily.   Yes Historical Provider, MD  quinapril (ACCUPRIL) 40 MG tablet Take 40 mg by mouth daily.  08/07/13  Yes Historical Provider, MD  ranitidine (ZANTAC) 150 MG tablet Take 150 mg by mouth at bedtime.   Yes Historical Provider, MD    Physical Exam:    Vitals:   03/19/16 0900 03/19/16 0930 03/19/16 1024 03/19/16 1030  BP: 105/67 99/66 104/58 107/67  Pulse: 80 83 86 87  Resp:      Temp:      TempSrc:      SpO2: 94% 98% 100% 100%       Constitutional: NAD, calm, uncomfortable due to abdominal pain  Vitals:   03/19/16 0900 03/19/16 0930 03/19/16 1024 03/19/16 1030  BP: 105/67 99/66 104/58 107/67  Pulse: 80 83 86 87  Resp:      Temp:      TempSrc:      SpO2: 94% 98% 100% 100%   Eyes: PERRL, lids and conjunctivae normal ENMT: Mucous membranes are moist. Posterior pharynx clear of any exudate or lesions.Normal dentition.  Neck: normal, supple, no masses, no thyromegaly Respiratory:  clear to auscultation bilaterally, no wheezing, no crackles. Normal respiratory effort. No accessory muscle use.  Cardiovascular: Regular rate and rhythm, no murmurs / rubs / gallops. No extremity edema. 2+ pedal pulses. No carotid bruits.  Abdomen:  Exquisite tenderness RLQ, extending to the LUQ and RUQ without masses   No hepatosplenomegaly. Bowel sounds positive.  Musculoskeletal: no clubbing / cyanosis. No joint deformity upper and lower extremities. Good ROM, no contractures. Normal muscle tone.  Skin: no rashes, lesions, ulcers.  Neurologic: CN 2-12 grossly intact. Sensation intact, DTR normal. Strength 5/5 in all 4.  Psychiatric: Normal  judgment and insight. Alert and oriented x 3. Normal mood.     Labs on Admission: I have personally reviewed following labs and imaging studies  CBC:  Recent Labs Lab 03/19/16 0712  WBC 13.9*  HGB 14.6  HCT 45.6  MCV 97.0  PLT [id number]    Basic Metabolic Panel:  Recent Labs Lab 03/19/16 0712  NA 139  K 3.5  CL 103  CO2 24  GLUCOSE 117*  BUN 14  CREATININE 1.02*  CALCIUM 9.6    GFR: CrCl cannot be calculated (Unknown ideal weight.).  Liver Function Tests:  Recent Labs Lab 03/19/16 0712  AST 20  ALT 21  ALKPHOS 86  BILITOT 1.1  PROT 7.3  ALBUMIN 3.7    Recent Labs Lab 03/19/16 0712  LIPASE 30   No results for input(s): AMMONIA in the last 168 hours.  Coagulation Profile: No results for input(s): INR, PROTIME in the last 168 hours.  Cardiac Enzymes: No results for input(s): CKTOTAL, CKMB, CKMBINDEX, TROPONINI in the last 168 hours.  BNP (last 3 results) No results for input(s): PROBNP in the last 8760 hours.  HbA1C: No results for input(s): HGBA1C in the last 72 hours.  CBG: No results for input(s): GLUCAP in the last 168 hours.  Lipid Profile: No results for input(s): CHOL, HDL, LDLCALC, TRIG, CHOLHDL, LDLDIRECT in the last 72 hours.  Thyroid Function Tests: No results for input(s): TSH, T4TOTAL, FREET4,  T3FREE, THYROIDAB in the last 72 hours.  Anemia Panel: No results for input(s): VITAMINB12, FOLATE, FERRITIN, TIBC, IRON, RETICCTPCT in the last 72 hours.  Urine analysis:    Component Value Date/Time   COLORURINE YELLOW 03/19/2016 0648   APPEARANCEUR CLEAR 03/19/2016 0648   LABSPEC 1.025 03/19/2016 0648   PHURINE 6.0 03/19/2016 0648   GLUCOSEU NEGATIVE 03/19/2016 0648   HGBUR LARGE (A) 03/19/2016 0648   BILIRUBINUR NEGATIVE 03/19/2016 0648   KETONESUR NEGATIVE 03/19/2016 0648   PROTEINUR NEGATIVE 03/19/2016 0648   UROBILINOGEN 0.2 02/18/2012 1056   NITRITE NEGATIVE 03/19/2016 0648   LEUKOCYTESUR NEGATIVE 03/19/2016 0648    Sepsis Labs: @LABRCNTIP (procalcitonin:4,lacticidven:4) )No results found for this or any previous visit (from the past 240 hour(s)).   Radiological Exams on Admission: Ct Abdomen Pelvis Wo Contrast  Result Date: 03/19/2016 CLINICAL DATA:  Lower abdominal pain and nausea over the last week. Clinical suspicion of diverticulitis or appendicitis. EXAM: CT ABDOMEN AND PELVIS WITHOUT CONTRAST TECHNIQUE: Multidetector CT imaging of the abdomen and pelvis was performed following the standard protocol without IV contrast. COMPARISON:  02/27/2014 FINDINGS: Lower chest: Normal Hepatobiliary: Liver parenchyma is normal without contrast. Previous cholecystectomy. Pancreas: Normal Spleen: Previous splenectomy. Adrenals/Urinary Tract: Adrenal glands are normal. Kidneys are normal. No cyst, mass, stone or hydronephrosis. Mild fullness of the right renal collecting system is chronic and normal. Stomach/Bowel: Diverticulosis of the sigmoid colon. Diverticulitis in the sigmoid region to the right of midline with some inflammatory change of the fat but no evidence of abscess, free fluid or free air. I do not see an appendix, either normal or abnormal. Vascular/Lymphatic: Aortic atherosclerosis. No aneurysm. The IVC is normal. No retroperitoneal adenopathy or mass. Reproductive: Previous  hysterectomy. Other: None Musculoskeletal: Ordinary mild lower lumbar degenerative changes. IMPRESSION: Acute diverticulitis in the sigmoid colon region. Edema in the fat, but no evidence of abscess, free fluid or free air. Electronically Signed   By: Nelson Chimes M.D.   On: 03/19/2016 09:17    EKG: Independently reviewed.  Assessment/Plan Active Problems:   Acute diverticulitis of  intestine   Obstructive sleep apnea   Essential hypertension   GERD   Irritable bowel syndrome   Leukocytosis     Acute diverticulitis, in a patient with a history of chronic diverticulosis. Presented with RLQ pain. CT abd and pelvis Acute diverticulitis in the sigmoid colon region, no evidence of abscess, free fluid or free air. CMET is normal. WBC 13.9 (likely reactive, rule out infectious)  UA neg for nitrite or leukocytes Received IVF at 125 cc NS , Flagyl and Cipro , MSO4.  Admit to MedSurg Obs  Gastric and bowel rest today, advance to clear liquids in am . Continue IV hydration with normal saline. Continue ciprofloxacin and Flagyl.  Protonix 40 mg IV every 24 hours. Analgesics and antiemetics as needed. Consult to GI. Dr. Amedeo Plenty has been notified  Consider surgical evaluation if no improvement. Blood cultures   Type II Diabetes Current blood sugar level is 117 Lab Results  Component Value Date   HGBA1C  07/20/2009    6.0 (NOTE) The ADA recommends the following therapeutic goal for glycemic control related to Hgb A1c measurement: Goal of therapy: <6.5 Hgb A1c  Reference: American Diabetes Association: Clinical Practice Recommendations 2010, Diabetes Care, 2010, 33: (Suppl  1).  Hgb A1C Hold home oral diabetic medications.   SSI  Hyperlipidemia Continue home statins   Hypertension BP 107/67   Pulse 87   Controlled Continue home anti-hypertensive medications   GERD, no acute symptoms: Continue PPI    OSA CPAP qhs  And Advair, Singulair, Dulera    Anxiety  Continue Klonopin   DVT  prophylaxis: Lovenox   Code Status:   Full    Family Communication:  Discussed with patient  Disposition Plan: Expect patient to be discharged to home after condition improves Consults called:    GI, Dr. Amedeo Plenty  Admission status: MedSurg  Obs   Rondel Jumbo, PA-C [facility]   If 7PM-7AM, please contact night-coverage [email or web address] Password [email or web address]  03/19/2016, 10:49 AM

## 2016-03-19 NOTE — ED Notes (Signed)
Admitting at bedside 

## 2016-03-20 DIAGNOSIS — Z6833 Body mass index (BMI) 33.0-33.9, adult: Secondary | ICD-10-CM | POA: Diagnosis not present

## 2016-03-20 DIAGNOSIS — K580 Irritable bowel syndrome with diarrhea: Secondary | ICD-10-CM

## 2016-03-20 DIAGNOSIS — F419 Anxiety disorder, unspecified: Secondary | ICD-10-CM | POA: Diagnosis present

## 2016-03-20 DIAGNOSIS — E119 Type 2 diabetes mellitus without complications: Secondary | ICD-10-CM

## 2016-03-20 DIAGNOSIS — E663 Overweight: Secondary | ICD-10-CM | POA: Diagnosis present

## 2016-03-20 DIAGNOSIS — E785 Hyperlipidemia, unspecified: Secondary | ICD-10-CM | POA: Diagnosis present

## 2016-03-20 DIAGNOSIS — I959 Hypotension, unspecified: Secondary | ICD-10-CM | POA: Diagnosis present

## 2016-03-20 DIAGNOSIS — I10 Essential (primary) hypertension: Secondary | ICD-10-CM

## 2016-03-20 DIAGNOSIS — G4733 Obstructive sleep apnea (adult) (pediatric): Secondary | ICD-10-CM | POA: Diagnosis present

## 2016-03-20 DIAGNOSIS — I110 Hypertensive heart disease with heart failure: Secondary | ICD-10-CM | POA: Diagnosis present

## 2016-03-20 DIAGNOSIS — K589 Irritable bowel syndrome without diarrhea: Secondary | ICD-10-CM | POA: Diagnosis present

## 2016-03-20 DIAGNOSIS — N179 Acute kidney failure, unspecified: Secondary | ICD-10-CM

## 2016-03-20 DIAGNOSIS — Z833 Family history of diabetes mellitus: Secondary | ICD-10-CM | POA: Diagnosis not present

## 2016-03-20 DIAGNOSIS — Z79899 Other long term (current) drug therapy: Secondary | ICD-10-CM | POA: Diagnosis not present

## 2016-03-20 DIAGNOSIS — Z8249 Family history of ischemic heart disease and other diseases of the circulatory system: Secondary | ICD-10-CM | POA: Diagnosis not present

## 2016-03-20 DIAGNOSIS — D72829 Elevated white blood cell count, unspecified: Secondary | ICD-10-CM

## 2016-03-20 DIAGNOSIS — Z888 Allergy status to other drugs, medicaments and biological substances status: Secondary | ICD-10-CM | POA: Diagnosis not present

## 2016-03-20 DIAGNOSIS — G43909 Migraine, unspecified, not intractable, without status migrainosus: Secondary | ICD-10-CM | POA: Diagnosis present

## 2016-03-20 DIAGNOSIS — K5732 Diverticulitis of large intestine without perforation or abscess without bleeding: Secondary | ICD-10-CM

## 2016-03-20 DIAGNOSIS — K219 Gastro-esophageal reflux disease without esophagitis: Secondary | ICD-10-CM | POA: Diagnosis present

## 2016-03-20 DIAGNOSIS — I5032 Chronic diastolic (congestive) heart failure: Secondary | ICD-10-CM

## 2016-03-20 DIAGNOSIS — Z881 Allergy status to other antibiotic agents status: Secondary | ICD-10-CM | POA: Diagnosis not present

## 2016-03-20 DIAGNOSIS — Z882 Allergy status to sulfonamides status: Secondary | ICD-10-CM | POA: Diagnosis not present

## 2016-03-20 DIAGNOSIS — Z792 Long term (current) use of antibiotics: Secondary | ICD-10-CM | POA: Diagnosis not present

## 2016-03-20 DIAGNOSIS — K5792 Diverticulitis of intestine, part unspecified, without perforation or abscess without bleeding: Secondary | ICD-10-CM | POA: Diagnosis present

## 2016-03-20 DIAGNOSIS — R1031 Right lower quadrant pain: Secondary | ICD-10-CM | POA: Diagnosis present

## 2016-03-20 DIAGNOSIS — Z880 Allergy status to penicillin: Secondary | ICD-10-CM | POA: Diagnosis not present

## 2016-03-20 HISTORY — DX: Diverticulitis of large intestine without perforation or abscess without bleeding: K57.32

## 2016-03-20 LAB — COMPREHENSIVE METABOLIC PANEL
ALBUMIN: 3 g/dL — AB (ref 3.5–5.0)
ALK PHOS: 79 U/L (ref 38–126)
ALT: 26 U/L (ref 14–54)
ANION GAP: 11 (ref 5–15)
AST: 26 U/L (ref 15–41)
BUN: 9 mg/dL (ref 6–20)
CHLORIDE: 102 mmol/L (ref 101–111)
CO2: 28 mmol/L (ref 22–32)
CREATININE: 1.2 mg/dL — AB (ref 0.44–1.00)
Calcium: 8.6 mg/dL — ABNORMAL LOW (ref 8.9–10.3)
GFR calc non Af Amer: 47 mL/min — ABNORMAL LOW (ref >60)
GFR, EST AFRICAN AMERICAN: 55 mL/min — AB (ref >60)
GLUCOSE: 100 mg/dL — AB (ref 65–99)
Potassium: 3.5 mmol/L (ref 3.5–5.1)
SODIUM: 141 mmol/L (ref 135–145)
Total Bilirubin: 1.1 mg/dL (ref 0.3–1.2)
Total Protein: 5.9 g/dL — ABNORMAL LOW (ref 6.5–8.1)

## 2016-03-20 LAB — CBC
HCT: 41.4 % (ref 36.0–46.0)
HEMOGLOBIN: 12.8 g/dL (ref 12.0–15.0)
MCH: 30.5 pg (ref 26.0–34.0)
MCHC: 30.9 g/dL (ref 30.0–36.0)
MCV: 98.6 fL (ref 78.0–100.0)
Platelets: 224 10*3/uL (ref 150–400)
RBC: 4.2 MIL/uL (ref 3.87–5.11)
RDW: 14 % (ref 11.5–15.5)
WBC: 11.7 10*3/uL — ABNORMAL HIGH (ref 4.0–10.5)

## 2016-03-20 MED ORDER — MORPHINE SULFATE (PF) 2 MG/ML IV SOLN
2.0000 mg | Freq: Once | INTRAVENOUS | Status: AC
  Administered 2016-03-20: 2 mg via INTRAVENOUS

## 2016-03-20 MED ORDER — HYDROCODONE-ACETAMINOPHEN 5-325 MG PO TABS
1.0000 | ORAL_TABLET | Freq: Four times a day (QID) | ORAL | Status: DC | PRN
  Administered 2016-03-20 – 2016-03-21 (×4): 2 via ORAL
  Administered 2016-03-22 – 2016-03-23 (×4): 1 via ORAL
  Filled 2016-03-20: qty 1
  Filled 2016-03-20: qty 2
  Filled 2016-03-20: qty 1
  Filled 2016-03-20: qty 2
  Filled 2016-03-20: qty 1
  Filled 2016-03-20 (×3): qty 2
  Filled 2016-03-20: qty 1

## 2016-03-20 NOTE — Progress Notes (Signed)
PROGRESS NOTE    [name]  [id number] DOB: 1955-10-03 DOA: 03/19/2016  PCP: Tivis Ringer, MD   Brief Narrative:   [name] is a 60 y.o. female with medical history significant for DM, diverticulosis, presenting with 1 week history of abdominal pain in left lower quadrent associated with diarrhea and nausea. She started Doxycycline on 10/6 as prescribed by Dr Cristina Gong but her symptoms progressed, she presented to the ER and was found to have sigmoid diverticulitis on CT.   Subjective: Left lower quadrant pain is severe. Mild nausea. No fever/ chills. No stool today  Assessment & Plan:   Principal Problem:   Sigmoid diverticulitis/ leukocytosis - Cipro/ Flagyl- clear liquids, IVF, pain and nausea control  Active Problems:   Irritable bowel syndrome - diarrhea on an off    AKI (acute kidney injury) - baseline Cr 0.9- currently 1.20- follow    Obstructive sleep apnea - CPAP    Hypotensive with h/o Essential hypertension - Norvasc, Lasix, Accupril on hold    GERD - PPI    DM type 2, goal HbA1c < 7% (HCC) - ISS    Chronic diastolic CHF (congestive heart failure) (HCC) - grade 2- ECHO- 7/17 - Hold Lasix- follow I and O , daily weights  DVT prophylaxis: Lovenox Code Status: Full code Family Communication:  Disposition Plan: home in 2-3 days Consultants:   GI Procedures:    Antimicrobials:  Anti-infectives    Start     Dose/Rate Route Frequency Ordered Stop   03/19/16 2200  ciprofloxacin (CIPRO) IVPB 400 mg     400 mg 200 mL/hr over 60 Minutes Intravenous Every 12 hours 03/19/16 1215     03/19/16 1800  metroNIDAZOLE (FLAGYL) IVPB 500 mg     500 mg 100 mL/hr over 60 Minutes Intravenous Every 8 hours 03/19/16 1104     03/19/16 1300  ciprofloxacin (CIPRO) IVPB 400 mg  Status:  Discontinued     400 mg 200 mL/hr over 60 Minutes Intravenous Every 12 hours 03/19/16 1215 03/19/16 1215   03/19/16 1000  ciprofloxacin (CIPRO) IVPB 400 mg     400  mg 200 mL/hr over 60 Minutes Intravenous  Once 03/19/16 0950 03/19/16 1141   03/19/16 1000  metroNIDAZOLE (FLAGYL) IVPB 500 mg     500 mg 100 mL/hr over 60 Minutes Intravenous  Once 03/19/16 0950 03/19/16 1657       Objective: Vitals:   03/19/16 2053 03/19/16 2133 03/20/16 0547 03/20/16 1430  BP: (!) 89/51 (!) 101/58 110/63 (!) 98/49  Pulse: 63  86 83  Resp: (!) 22  19 19   Temp: 99.5 F (37.5 C)  99.6 F (37.6 C) 98 F (36.7 C)  TempSrc: Oral  Oral Oral  SpO2: 95%  96% 93%  Weight:        Intake/Output Summary (Last 24 hours) at 03/20/16 1444 Last data filed at 03/20/16 1300  Gross per 24 hour  Intake           2117.5 ml  Output             2200 ml  Net            -82.5 ml   Filed Weights   03/19/16 1200  Weight: 104.3 kg (229 lb 15 oz)    Examination: General exam: Appears comfortable  HEENT: PERRLA, oral mucosa moist, no sclera icterus or thrush Respiratory system: Clear to auscultation. Respiratory effort normal. Cardiovascular system: S1 & S2 heard, RRR.  No murmurs  Gastrointestinal system: Abdomen soft,  -tender in RLQ, nondistended. Normal bowel sound. No organomegaly Central nervous system: Alert and oriented. No focal neurological deficits. Extremities: No cyanosis, clubbing or edema Skin: No rashes or ulcers Psychiatry:  Mood & affect appropriate.     Data Reviewed: I have personally reviewed following labs and imaging studies  CBC:  Recent Labs Lab 03/19/16 0712 03/20/16 0213  WBC 13.9* 11.7*  HGB 14.6 12.8  HCT 45.6 41.4  MCV 97.0 98.6  PLT 371 [id number]   Basic Metabolic Panel:  Recent Labs Lab 03/19/16 0712 03/20/16 0213  NA 139 141  K 3.5 3.5  CL 103 102  CO2 24 28  GLUCOSE 117* 100*  BUN 14 9  CREATININE 1.02* 1.20*  CALCIUM 9.6 8.6*   GFR: Estimated Creatinine Clearance: 62 mL/min (by C-G formula based on SCr of 1.2 mg/dL (H)). Liver Function Tests:  Recent Labs Lab 03/19/16 0712 03/20/16 0213  AST 20 26  ALT 21 26   ALKPHOS 86 79  BILITOT 1.1 1.1  PROT 7.3 5.9*  ALBUMIN 3.7 3.0*    Recent Labs Lab 03/19/16 0712  LIPASE 30   No results for input(s): AMMONIA in the last 168 hours. Coagulation Profile: No results for input(s): INR, PROTIME in the last 168 hours. Cardiac Enzymes: No results for input(s): CKTOTAL, CKMB, CKMBINDEX, TROPONINI in the last 168 hours. BNP (last 3 results) No results for input(s): PROBNP in the last 8760 hours. HbA1C: No results for input(s): HGBA1C in the last 72 hours. CBG: No results for input(s): GLUCAP in the last 168 hours. Lipid Profile: No results for input(s): CHOL, HDL, LDLCALC, TRIG, CHOLHDL, LDLDIRECT in the last 72 hours. Thyroid Function Tests: No results for input(s): TSH, T4TOTAL, FREET4, T3FREE, THYROIDAB in the last 72 hours. Anemia Panel: No results for input(s): VITAMINB12, FOLATE, FERRITIN, TIBC, IRON, RETICCTPCT in the last 72 hours. Urine analysis:    Component Value Date/Time   COLORURINE YELLOW 03/19/2016 0648   APPEARANCEUR CLEAR 03/19/2016 0648   LABSPEC 1.025 03/19/2016 0648   PHURINE 6.0 03/19/2016 0648   GLUCOSEU NEGATIVE 03/19/2016 0648   HGBUR LARGE (A) 03/19/2016 0648   BILIRUBINUR NEGATIVE 03/19/2016 0648   KETONESUR NEGATIVE 03/19/2016 0648   PROTEINUR NEGATIVE 03/19/2016 0648   UROBILINOGEN 0.2 02/18/2012 1056   NITRITE NEGATIVE 03/19/2016 0648   LEUKOCYTESUR NEGATIVE 03/19/2016 0648   Sepsis Labs: @LABRCNTIP (procalcitonin:4,lacticidven:4) )No results found for this or any previous visit (from the past 240 hour(s)).       Radiology Studies: Ct Abdomen Pelvis Wo Contrast  Result Date: 03/19/2016 CLINICAL DATA:  Lower abdominal pain and nausea over the last week. Clinical suspicion of diverticulitis or appendicitis. EXAM: CT ABDOMEN AND PELVIS WITHOUT CONTRAST TECHNIQUE: Multidetector CT imaging of the abdomen and pelvis was performed following the standard protocol without IV contrast. COMPARISON:  02/27/2014  FINDINGS: Lower chest: Normal Hepatobiliary: Liver parenchyma is normal without contrast. Previous cholecystectomy. Pancreas: Normal Spleen: Previous splenectomy. Adrenals/Urinary Tract: Adrenal glands are normal. Kidneys are normal. No cyst, mass, stone or hydronephrosis. Mild fullness of the right renal collecting system is chronic and normal. Stomach/Bowel: Diverticulosis of the sigmoid colon. Diverticulitis in the sigmoid region to the right of midline with some inflammatory change of the fat but no evidence of abscess, free fluid or free air. I do not see an appendix, either normal or abnormal. Vascular/Lymphatic: Aortic atherosclerosis. No aneurysm. The IVC is normal. No retroperitoneal adenopathy or mass. Reproductive: Previous hysterectomy. Other: None Musculoskeletal: Ordinary mild lower lumbar degenerative  changes. IMPRESSION: Acute diverticulitis in the sigmoid colon region. Edema in the fat, but no evidence of abscess, free fluid or free air. Electronically Signed   By: Nelson Chimes M.D.   On: 03/19/2016 09:17      Scheduled Meds: . amLODipine  5 mg Oral Daily  . ciprofloxacin  400 mg Intravenous Q12H  . clonazePAM  0.5 mg Oral QHS  . enoxaparin (LOVENOX) injection  40 mg Subcutaneous Q24H  . famotidine (PEPCID) IV  20 mg Intravenous Q12H  . mouth rinse  15 mL Mouth Rinse BID  . metronidazole  500 mg Intravenous Q8H  . montelukast  10 mg Oral QHS  . rosuvastatin  10 mg Oral Daily   Continuous Infusions: . sodium chloride 125 mL/hr at 03/19/16 0749  . sodium chloride 125 mL/hr at 03/20/16 1329     LOS: 0 days    Time spent in minutes: 56    [place], MD [facility] Pager: [email or web address] Password [email or web address] 03/20/2016, 2:44 PM

## 2016-03-20 NOTE — Progress Notes (Signed)
Patient has home CPAP and able to place herself on and off machine.

## 2016-03-20 NOTE — Progress Notes (Signed)
Notified Dr Laren Everts of patient's initial BP read 89/51, and retake BP 101/58. Patient asymptomatic. Dr Laren Everts acknowledged. No further orders given at this time. Will continue to monitor patient.

## 2016-03-21 MED ORDER — PROMETHAZINE HCL 25 MG/ML IJ SOLN
25.0000 mg | Freq: Four times a day (QID) | INTRAMUSCULAR | Status: DC | PRN
  Administered 2016-03-21: 25 mg via INTRAVENOUS
  Filled 2016-03-21: qty 1

## 2016-03-21 NOTE — Progress Notes (Signed)
Subjective: Abdominal pain slightly better.  Objective: Vital signs in last 24 hours: Temp:  [98 F (36.7 C)-98.8 F (37.1 C)] 98.6 F (37 C) (10/09 0500) Pulse Rate:  [66-83] 66 (10/09 0500) Resp:  [17-19] 17 (10/09 0500) BP: (94-98)/(49-58) 96/58 (10/09 0500) SpO2:  [93 %-95 %] 95 % (10/09 0500) Weight:  [102 kg (224 lb 12.8 oz)] 102 kg (224 lb 12.8 oz) (10/08 1430) Weight change: -2.331 kg (-5 lb 2.2 oz) Last BM Date: 03/18/16  PE: GEN:  Overweight ABD:  Protuberant, suprapubic and LLQ tenderness with mild voluntary guarding, minimal bowel sounds, no peritonitis  Lab Results: CBC    Component Value Date/Time   WBC 11.7 (H) 03/20/2016 0213   RBC 4.20 03/20/2016 0213   HGB 12.8 03/20/2016 0213   HCT 41.4 03/20/2016 0213   PLT 224 03/20/2016 0213   MCV 98.6 03/20/2016 0213   MCH 30.5 03/20/2016 0213   MCHC 30.9 03/20/2016 0213   RDW 14.0 03/20/2016 0213   LYMPHSABS 5.5 (H) 02/11/2015 2214   MONOABS 1.3 (H) 02/11/2015 2214   EOSABS 0.3 02/11/2015 2214   BASOSABS 0.1 02/11/2015 2214   CMP     Component Value Date/Time   NA 141 03/20/2016 0213   K 3.5 03/20/2016 0213   CL 102 03/20/2016 0213   CO2 28 03/20/2016 0213   GLUCOSE 100 (H) 03/20/2016 0213   BUN 9 03/20/2016 0213   CREATININE 1.20 (H) 03/20/2016 0213   CALCIUM 8.6 (L) 03/20/2016 0213   PROT 5.9 (L) 03/20/2016 0213   ALBUMIN 3.0 (L) 03/20/2016 0213   AST 26 03/20/2016 0213   ALT 26 03/20/2016 0213   ALKPHOS 79 03/20/2016 0213   BILITOT 1.1 03/20/2016 0213   GFRNONAA 47 (L) 03/20/2016 0213   GFRAA 55 (L) 03/20/2016 0213   Assessment:  1.  Sigmoid diverticulitis, without complicating features on recent CT scan. 2.  Abdominal pain, from #1 above, slowly improving. 3.  Leukocytosis, likely from #1 above, slowing decreasing.  Plan:  1.  Clear liquid diet only; do not advance. 2.  Continue intravenous ciprofloxacin and continue intravenous metronidazole. 3.  If patient does not continue to make  steady improvement over the next couple days, might have to repeat CT scan to assess for abscess or other complicating features of diverticulitis. 4.  OOBTC, ambulate room as tolerated. 5.  [facility] will follow.   Landry Dyke 03/21/2016, 9:34 AM   Pager [phone] If no answer or after 5 PM call [phone]

## 2016-03-21 NOTE — Evaluation (Signed)
Occupational Therapy Evaluation and Discharge Patient Details Name: [name] MRN: [id number] DOB: 1955/07/25 Today's Date: 03/21/2016    History of Present Illness Pt admitted with sigmoid diverticulitis with nausea and diarrhea. PMH: DM, IBS, OSA, HTN.   Clinical Impression   Pt is performing ADL and mobility at a supervision level due to report of dizziness. Educated on benefits of OOB activity. Recommend ambulation with nursing staff. No further OT needs.    Follow Up Recommendations  No OT follow up    Equipment Recommendations       Recommendations for Other Services       Precautions / Restrictions Precautions Precautions: Fall  Precaution Comments: dizzy in sitting and standing      Mobility Bed Mobility Overal bed mobility: Modified Independent                Transfers Overall transfer level: Needs assistance   Transfers: Sit to/from Stand Sit to Stand: Supervision         General transfer comment: supervised for safety, cued to stand momentarily prior to walking    Balance                                            ADL Overall ADL's : Needs assistance/impaired Eating/Feeding: Independent;Sitting   Grooming: Wash/dry hands;Standing;Supervision/safety   Upper Body Bathing: Set up;Sitting   Lower Body Bathing: Supervison/ safety;Sit to/from stand   Upper Body Dressing : Set up;Sitting   Lower Body Dressing: Supervision/safety;Sit to/from stand   Toilet Transfer: Supervision/safety;Ambulation;Comfort height toilet   Toileting- Clothing Manipulation and Hygiene: Supervision/safety;Sit to/from stand       Functional mobility during ADLs: Supervision/safety (due to symptomatic hypotension) General ADL Comments: Educated pt in importance of OOB activity with supervision     Vision     Perception     Praxis      Pertinent Vitals/Pain Pain Assessment: 0-10 Pain Score: 5  Pain Location: abdomen Pain  Descriptors / Indicators: Aching Pain Intervention(s): Repositioned;Patient requesting pain meds-RN notified     Hand Dominance Right   Extremity/Trunk Assessment Upper Extremity Assessment Upper Extremity Assessment: Overall WFL for tasks assessed   Lower Extremity Assessment Lower Extremity Assessment: Overall WFL for tasks assessed   Cervical / Trunk Assessment Cervical / Trunk Assessment:  (hx of arthritis in back)   Communication Communication Communication: No difficulties   Cognition Arousal/Alertness: Awake/alert Behavior During Therapy: WFL for tasks assessed/performed Overall Cognitive Status: Within Functional Limits for tasks assessed                     General Comments       Exercises       Shoulder Instructions      Home Living Family/patient expects to be discharged to:: Private residence Living Arrangements: Spouse/significant other Available Help at Discharge: Family;Available 24 hours/day Type of Home: House                       Home Equipment: None          Prior Functioning/Environment Level of Independence: Independent        Comments: pt works in [place] at [employer]        OT Problem List: Decreased activity tolerance   OT Treatment/Interventions:      OT Goals(Current goals can be found in the care plan  section) Acute Rehab OT Goals Patient Stated Goal: feel better  OT Frequency:     Barriers to D/C:            Co-evaluation              End of Session Nurse Communication: Patient requests pain meds  Activity Tolerance: Patient limited by pain;Patient limited by fatigue Patient left: in chair;with call bell/phone within reach;with nursing/sitter in room;with family/visitor present   Time: 1002-1026 OT Time Calculation (min): 24 min Charges:  OT General Charges $OT Visit: 1 Procedure OT Evaluation $OT Eval Low Complexity: 1 Procedure OT Treatments $Self Care/Home Management : 8-22 mins G-Codes:     Malka So 03/21/2016, 10:35 AM  [phone]

## 2016-03-21 NOTE — Progress Notes (Signed)
PROGRESS NOTE    [name]  [id number] DOB: 11-24-1955 DOA: 03/19/2016  PCP: Tivis Ringer, MD   Brief Narrative:   [name] is a 60 y.o. female with medical history significant for DM, diverticulosis, presenting with 1 week history of abdominal pain in left lower quadrent associated with diarrhea and nausea. She started Doxycycline on 10/6 as prescribed by Dr Cristina Gong but her symptoms progressed, she presented to the ER and was found to have sigmoid diverticulitis on CT.   Subjective: Left lower quadrant pain is significant. Tolerating clears. No fever/ chills.   Assessment & Plan:   Principal Problem:   Sigmoid diverticulitis/ leukocytosis - Cipro/ Flagyl- clear liquids, IVF, pain and nausea control  Active Problems:   Irritable bowel syndrome - diarrhea on an off    AKI (acute kidney injury) - baseline Cr 0.9- currently 1.20- follow    Obstructive sleep apnea - CPAP    Hypotensive with h/o Essential hypertension - Norvasc, Lasix, Accupril on hold    GERD - PPI    DM type 2, goal HbA1c < 7% (HCC) - ISS    Chronic diastolic CHF (congestive heart failure) (HCC) - grade 2- ECHO- 7/17 - Hold Lasix- follow I and O , daily weights  DVT prophylaxis: Lovenox Code Status: Full code Family Communication:  Disposition Plan: home in 2-3 days Consultants:   GI Procedures:    Antimicrobials:  Anti-infectives    Start     Dose/Rate Route Frequency Ordered Stop   03/19/16 2200  ciprofloxacin (CIPRO) IVPB 400 mg     400 mg 200 mL/hr over 60 Minutes Intravenous Every 12 hours 03/19/16 1215     03/19/16 1800  metroNIDAZOLE (FLAGYL) IVPB 500 mg     500 mg 100 mL/hr over 60 Minutes Intravenous Every 8 hours 03/19/16 1104     03/19/16 1300  ciprofloxacin (CIPRO) IVPB 400 mg  Status:  Discontinued     400 mg 200 mL/hr over 60 Minutes Intravenous Every 12 hours 03/19/16 1215 03/19/16 1215   03/19/16 1000  ciprofloxacin (CIPRO) IVPB 400 mg     400  mg 200 mL/hr over 60 Minutes Intravenous  Once 03/19/16 0950 03/19/16 1141   03/19/16 1000  metroNIDAZOLE (FLAGYL) IVPB 500 mg     500 mg 100 mL/hr over 60 Minutes Intravenous  Once 03/19/16 0950 03/19/16 1657       Objective: Vitals:   03/20/16 1430 03/20/16 2035 03/21/16 0500 03/21/16 1436  BP: (!) 98/49 (!) 94/50 (!) 96/58 (!) 104/49  Pulse: 83 67 66 84  Resp: 19 18 17 18   Temp: 98 F (36.7 C) 98.8 F (37.1 C) 98.6 F (37 C) 98.6 F (37 C)  TempSrc: Oral Oral Oral Oral  SpO2: 93% 94% 95% 98%  Weight: 102 kg (224 lb 12.8 oz)       Intake/Output Summary (Last 24 hours) at 03/21/16 1814 Last data filed at 03/21/16 1437  Gross per 24 hour  Intake             2010 ml  Output              740 ml  Net             1270 ml   Filed Weights   03/19/16 1200 03/20/16 1430  Weight: 104.3 kg (229 lb 15 oz) 102 kg (224 lb 12.8 oz)    Examination: General exam: Appears comfortable  HEENT: PERRLA, oral mucosa moist, no sclera icterus or thrush  Respiratory system: Clear to auscultation. Respiratory effort normal. Cardiovascular system: S1 & S2 heard, RRR.  No murmurs  Gastrointestinal system: Abdomen soft,  -tender in RLQ, nondistended. Normal bowel sound. No organomegaly Central nervous system: Alert and oriented. No focal neurological deficits. Extremities: No cyanosis, clubbing or edema Skin: No rashes or ulcers Psychiatry:  Mood & affect appropriate.     Data Reviewed: I have personally reviewed following labs and imaging studies  CBC:  Recent Labs Lab 03/19/16 0712 03/20/16 0213  WBC 13.9* 11.7*  HGB 14.6 12.8  HCT 45.6 41.4  MCV 97.0 98.6  PLT 371 [id number]   Basic Metabolic Panel:  Recent Labs Lab 03/19/16 0712 03/20/16 0213  NA 139 141  K 3.5 3.5  CL 103 102  CO2 24 28  GLUCOSE 117* 100*  BUN 14 9  CREATININE 1.02* 1.20*  CALCIUM 9.6 8.6*   GFR: Estimated Creatinine Clearance: 61.3 mL/min (by C-G formula based on SCr of 1.2 mg/dL (H)). Liver Function  Tests:  Recent Labs Lab 03/19/16 0712 03/20/16 0213  AST 20 26  ALT 21 26  ALKPHOS 86 79  BILITOT 1.1 1.1  PROT 7.3 5.9*  ALBUMIN 3.7 3.0*    Recent Labs Lab 03/19/16 0712  LIPASE 30   No results for input(s): AMMONIA in the last 168 hours. Coagulation Profile: No results for input(s): INR, PROTIME in the last 168 hours. Cardiac Enzymes: No results for input(s): CKTOTAL, CKMB, CKMBINDEX, TROPONINI in the last 168 hours. BNP (last 3 results) No results for input(s): PROBNP in the last 8760 hours. HbA1C: No results for input(s): HGBA1C in the last 72 hours. CBG: No results for input(s): GLUCAP in the last 168 hours. Lipid Profile: No results for input(s): CHOL, HDL, LDLCALC, TRIG, CHOLHDL, LDLDIRECT in the last 72 hours. Thyroid Function Tests: No results for input(s): TSH, T4TOTAL, FREET4, T3FREE, THYROIDAB in the last 72 hours. Anemia Panel: No results for input(s): VITAMINB12, FOLATE, FERRITIN, TIBC, IRON, RETICCTPCT in the last 72 hours. Urine analysis:    Component Value Date/Time   COLORURINE YELLOW 03/19/2016 0648   APPEARANCEUR CLEAR 03/19/2016 0648   LABSPEC 1.025 03/19/2016 0648   PHURINE 6.0 03/19/2016 0648   GLUCOSEU NEGATIVE 03/19/2016 0648   HGBUR LARGE (A) 03/19/2016 0648   BILIRUBINUR NEGATIVE 03/19/2016 0648   KETONESUR NEGATIVE 03/19/2016 0648   PROTEINUR NEGATIVE 03/19/2016 0648   UROBILINOGEN 0.2 02/18/2012 1056   NITRITE NEGATIVE 03/19/2016 0648   LEUKOCYTESUR NEGATIVE 03/19/2016 0648   Sepsis Labs: @LABRCNTIP (procalcitonin:4,lacticidven:4) )No results found for this or any previous visit (from the past 240 hour(s)).       Radiology Studies: No results found.    Scheduled Meds: . ciprofloxacin  400 mg Intravenous Q12H  . clonazePAM  0.5 mg Oral QHS  . enoxaparin (LOVENOX) injection  40 mg Subcutaneous Q24H  . famotidine (PEPCID) IV  20 mg Intravenous Q12H  . mouth rinse  15 mL Mouth Rinse BID  . metronidazole  500 mg  Intravenous Q8H  . montelukast  10 mg Oral QHS  . rosuvastatin  10 mg Oral Daily   Continuous Infusions: . sodium chloride 125 mL/hr at 03/21/16 0500  . sodium chloride 125 mL/hr at 03/21/16 1647     LOS: 1 day    Time spent in minutes: 48    [place], MD [facility] Pager: [email or web address] Password [email or web address] 03/21/2016, 6:14 PM

## 2016-03-22 ENCOUNTER — Encounter (HOSPITAL_COMMUNITY): Payer: Self-pay | Admitting: General Practice

## 2016-03-22 DIAGNOSIS — K219 Gastro-esophageal reflux disease without esophagitis: Secondary | ICD-10-CM

## 2016-03-22 DIAGNOSIS — K588 Other irritable bowel syndrome: Secondary | ICD-10-CM

## 2016-03-22 LAB — BASIC METABOLIC PANEL
Anion gap: 11 (ref 5–15)
BUN: 5 mg/dL — ABNORMAL LOW (ref 6–20)
CALCIUM: 9.1 mg/dL (ref 8.9–10.3)
CO2: 23 mmol/L (ref 22–32)
Chloride: 109 mmol/L (ref 101–111)
Creatinine, Ser: 0.99 mg/dL (ref 0.44–1.00)
GFR calc Af Amer: >60 mL/min (ref >60)
GFR calc non Af Amer: 60 mL/min — ABNORMAL LOW (ref >60)
Glucose, Bld: 88 mg/dL (ref 65–99)
Potassium: 3.5 mmol/L (ref 3.5–5.1)
Sodium: 143 mmol/L (ref 135–145)

## 2016-03-22 MED ORDER — PROMETHAZINE HCL 25 MG/ML IJ SOLN: 12.5000 mg | Freq: Four times a day (QID) | INTRAMUSCULAR | Status: DC | PRN

## 2016-03-22 NOTE — Progress Notes (Signed)
Patient has home CPAP and places herself on and off when ready. RT will continue to monitor.

## 2016-03-22 NOTE — Progress Notes (Signed)
Subjective: Slight improvement of abdominal pain.  Objective: Vital signs in last 24 hours: Temp:  [97.9 F (36.6 C)-98.6 F (37 C)] 98 F (36.7 C) (10/10 0516) Pulse Rate:  [76-84] 76 (10/10 0516) Resp:  [18-19] 19 (10/10 0516) BP: (103-104)/(49-60) 104/60 (10/10 0516) SpO2:  [95 %-98 %] 95 % (10/10 0516) Weight:  [100.1 kg (220 lb 9.6 oz)] 100.1 kg (220 lb 9.6 oz) (10/10 1016) Weight change:  Last BM Date: 03/18/16  PE: GEN:  NAD, somewhat deconditioned-appearing ABD: Soft, lower abdominal tenderness (improved some since yesterday), hypoactive but present bowel sounds  Lab Results: CBC    Component Value Date/Time   WBC 11.7 (H) 03/20/2016 0213   RBC 4.20 03/20/2016 0213   HGB 12.8 03/20/2016 0213   HCT 41.4 03/20/2016 0213   PLT 224 03/20/2016 0213   MCV 98.6 03/20/2016 0213   MCH 30.5 03/20/2016 0213   MCHC 30.9 03/20/2016 0213   RDW 14.0 03/20/2016 0213   LYMPHSABS 5.5 (H) 02/11/2015 2214   MONOABS 1.3 (H) 02/11/2015 2214   EOSABS 0.3 02/11/2015 2214   BASOSABS 0.1 02/11/2015 2214    Assessment:  1.  Sigmoid diverticulitis, without complicating features on recent CT scan. 2.  Abdominal pain, from #1 above, slowly improving. 3.  Leukocytosis, likely from #1 above, decreasing.  Plan:  1.  Continue IV antibiotics and continue clear liquids. 2.  If continues improvement tomorrow, consider slow advance of diet and oral antibiotics; if not continuing to improve, would consider CT scan to assess for complication of diverticulitis (abscess, microperforation). 3.  [facility] will follow.   Landry Dyke 03/22/2016, 10:29 AM   Pager [phone] If no answer or after 5 PM call [phone]

## 2016-03-22 NOTE — Progress Notes (Signed)
PROGRESS NOTE    [name]  [id number] DOB: 01-Jan-1956 DOA: 03/19/2016  PCP: Tivis Ringer, MD   Brief Narrative:   [name] is a 60 y.o. female with medical history significant for DM, diverticulosis, presenting with 1 week history of abdominal pain in left lower quadrent associated with diarrhea and nausea. She started Doxycycline on 10/6 as prescribed by Dr Cristina Gong but her symptoms progressed, she presented to the ER and was found to have sigmoid diverticulitis on CT.   Subjective: Left lower quadrant pain has improved a little.  Vomited up clears yesterday. Phenergan helped better than Zofran but knocked her out. No fever/ chills.   Assessment & Plan:   Principal Problem:   Sigmoid diverticulitis/ leukocytosis - Cipro/ Flagyl IV -  clear liquids vomiting with trying to drink aggressively -  IVF, pain and nausea control - if not improving, will need to repeat CT  Active Problems:   Irritable bowel syndrome - diarrhea on an off at home    AKI (acute kidney injury) - baseline Cr 0.9 - 1.20 on 10/8- now normalized- follow    Chronic diastolic CHF (congestive heart failure) (HCC) - grade 2- ECHO- 7/17 - Hold Lasix- follow I and O , daily weights - weight 104>>>100- will cont IVF at current rate    Obstructive sleep apnea - CPAP    Hypotensive with h/o Essential hypertension - Norvasc, Lasix, Accupril on hold    GERD - PPI    DM type 2, goal HbA1c < 7% (HCC) - ISS    DVT prophylaxis: Lovenox Code Status: Full code Family Communication:  Disposition Plan: home in 2-3 days Consultants:   GI Procedures:    Antimicrobials:  Anti-infectives    Start     Dose/Rate Route Frequency Ordered Stop   03/19/16 2200  ciprofloxacin (CIPRO) IVPB 400 mg     400 mg 200 mL/hr over 60 Minutes Intravenous Every 12 hours 03/19/16 1215     03/19/16 1800  metroNIDAZOLE (FLAGYL) IVPB 500 mg     500 mg 100 mL/hr over 60 Minutes Intravenous Every 8 hours  03/19/16 1104     03/19/16 1300  ciprofloxacin (CIPRO) IVPB 400 mg  Status:  Discontinued     400 mg 200 mL/hr over 60 Minutes Intravenous Every 12 hours 03/19/16 1215 03/19/16 1215   03/19/16 1000  ciprofloxacin (CIPRO) IVPB 400 mg     400 mg 200 mL/hr over 60 Minutes Intravenous  Once 03/19/16 0950 03/19/16 1141   03/19/16 1000  metroNIDAZOLE (FLAGYL) IVPB 500 mg     500 mg 100 mL/hr over 60 Minutes Intravenous  Once 03/19/16 0950 03/19/16 1657       Objective: Vitals:   03/21/16 2100 03/22/16 0516 03/22/16 1016 03/22/16 1353  BP: (!) 103/52 104/60  114/60  Pulse: 76 76  87  Resp: 18 19  16   Temp: 97.9 F (36.6 C) 98 F (36.7 C)  97.7 F (36.5 C)  TempSrc: Oral Oral  Oral  SpO2: 95% 95%  99%  Weight:   100.1 kg (220 lb 9.6 oz)     Intake/Output Summary (Last 24 hours) at 03/22/16 1715 Last data filed at 03/22/16 1403  Gross per 24 hour  Intake             2986 ml  Output             2850 ml  Net  136 ml   Filed Weights   03/19/16 1200 03/20/16 1430 03/22/16 1016  Weight: 104.3 kg (229 lb 15 oz) 102 kg (224 lb 12.8 oz) 100.1 kg (220 lb 9.6 oz)    Examination: General exam: Appears comfortable  HEENT: PERRLA, oral mucosa moist, no sclera icterus or thrush Respiratory system: Clear to auscultation. Respiratory effort normal. Cardiovascular system: S1 & S2 heard, RRR.  No murmurs  Gastrointestinal system: Abdomen soft,  -tender in RLQ, nondistended. Normal bowel sound. No organomegaly Central nervous system: Alert and oriented. No focal neurological deficits. Extremities: No cyanosis, clubbing or edema Skin: No rashes or ulcers Psychiatry:  Mood & affect appropriate.     Data Reviewed: I have personally reviewed following labs and imaging studies  CBC:  Recent Labs Lab 03/19/16 0712 03/20/16 0213  WBC 13.9* 11.7*  HGB 14.6 12.8  HCT 45.6 41.4  MCV 97.0 98.6  PLT 371 [id number]   Basic Metabolic Panel:  Recent Labs Lab 03/19/16 0712  03/20/16 0213 03/22/16 0648  NA 139 141 143  K 3.5 3.5 3.5  CL 103 102 109  CO2 24 28 23   GLUCOSE 117* 100* 88  BUN 14 9 5*  CREATININE 1.02* 1.20* 0.99  CALCIUM 9.6 8.6* 9.1   GFR: Estimated Creatinine Clearance: 73.6 mL/min (by C-G formula based on SCr of 0.99 mg/dL). Liver Function Tests:  Recent Labs Lab 03/19/16 0712 03/20/16 0213  AST 20 26  ALT 21 26  ALKPHOS 86 79  BILITOT 1.1 1.1  PROT 7.3 5.9*  ALBUMIN 3.7 3.0*    Recent Labs Lab 03/19/16 0712  LIPASE 30   No results for input(s): AMMONIA in the last 168 hours. Coagulation Profile: No results for input(s): INR, PROTIME in the last 168 hours. Cardiac Enzymes: No results for input(s): CKTOTAL, CKMB, CKMBINDEX, TROPONINI in the last 168 hours. BNP (last 3 results) No results for input(s): PROBNP in the last 8760 hours. HbA1C: No results for input(s): HGBA1C in the last 72 hours. CBG: No results for input(s): GLUCAP in the last 168 hours. Lipid Profile: No results for input(s): CHOL, HDL, LDLCALC, TRIG, CHOLHDL, LDLDIRECT in the last 72 hours. Thyroid Function Tests: No results for input(s): TSH, T4TOTAL, FREET4, T3FREE, THYROIDAB in the last 72 hours. Anemia Panel: No results for input(s): VITAMINB12, FOLATE, FERRITIN, TIBC, IRON, RETICCTPCT in the last 72 hours. Urine analysis:    Component Value Date/Time   COLORURINE YELLOW 03/19/2016 0648   APPEARANCEUR CLEAR 03/19/2016 0648   LABSPEC 1.025 03/19/2016 0648   PHURINE 6.0 03/19/2016 0648   GLUCOSEU NEGATIVE 03/19/2016 0648   HGBUR LARGE (A) 03/19/2016 0648   BILIRUBINUR NEGATIVE 03/19/2016 0648   KETONESUR NEGATIVE 03/19/2016 0648   PROTEINUR NEGATIVE 03/19/2016 0648   UROBILINOGEN 0.2 02/18/2012 1056   NITRITE NEGATIVE 03/19/2016 0648   LEUKOCYTESUR NEGATIVE 03/19/2016 0648   Sepsis Labs: @LABRCNTIP (procalcitonin:4,lacticidven:4) )No results found for this or any previous visit (from the past 240 hour(s)).       Radiology  Studies: No results found.    Scheduled Meds: . ciprofloxacin  400 mg Intravenous Q12H  . clonazePAM  0.5 mg Oral QHS  . enoxaparin (LOVENOX) injection  40 mg Subcutaneous Q24H  . famotidine (PEPCID) IV  20 mg Intravenous Q12H  . mouth rinse  15 mL Mouth Rinse BID  . metronidazole  500 mg Intravenous Q8H  . montelukast  10 mg Oral QHS  . rosuvastatin  10 mg Oral Daily   Continuous Infusions: . sodium chloride 125 mL/hr at  03/21/16 0500  . sodium chloride 125 mL/hr at 03/22/16 1243     LOS: 2 days    Time spent in minutes: 25    [place], MD [facility] Pager: [email or web address] Password [email or web address] 03/22/2016, 5:15 PM

## 2016-03-23 DIAGNOSIS — K5732 Diverticulitis of large intestine without perforation or abscess without bleeding: Principal | ICD-10-CM

## 2016-03-23 MED ORDER — SACCHAROMYCES BOULARDII 250 MG PO CAPS
250.0000 mg | ORAL_CAPSULE | Freq: Two times a day (BID) | ORAL | Status: DC
  Administered 2016-03-23 – 2016-03-24 (×3): 250 mg via ORAL
  Filled 2016-03-23 (×3): qty 1

## 2016-03-23 MED ORDER — AMLODIPINE BESYLATE 5 MG PO TABS
5.0000 mg | ORAL_TABLET | Freq: Every day | ORAL | Status: DC
  Administered 2016-03-23: 5 mg via ORAL
  Filled 2016-03-23 (×2): qty 1

## 2016-03-23 MED ORDER — QUINAPRIL HCL 10 MG PO TABS
20.0000 mg | ORAL_TABLET | Freq: Every day | ORAL | Status: DC
  Administered 2016-03-23: 20 mg via ORAL
  Filled 2016-03-23 (×2): qty 2

## 2016-03-23 MED ORDER — METRONIDAZOLE 500 MG PO TABS
500.0000 mg | ORAL_TABLET | Freq: Three times a day (TID) | ORAL | Status: DC
  Administered 2016-03-23 – 2016-03-24 (×3): 500 mg via ORAL
  Filled 2016-03-23 (×3): qty 1

## 2016-03-23 MED ORDER — FAMOTIDINE 20 MG PO TABS
20.0000 mg | ORAL_TABLET | Freq: Two times a day (BID) | ORAL | Status: DC
  Administered 2016-03-23 – 2016-03-24 (×2): 20 mg via ORAL
  Filled 2016-03-23 (×2): qty 1

## 2016-03-23 MED ORDER — POLYETHYLENE GLYCOL 3350 17 G PO PACK
17.0000 g | PACK | Freq: Every day | ORAL | Status: DC
  Administered 2016-03-23 – 2016-03-24 (×2): 17 g via ORAL
  Filled 2016-03-23: qty 1

## 2016-03-23 NOTE — Progress Notes (Signed)
Bladder scan  29 cc

## 2016-03-23 NOTE — Progress Notes (Signed)
Afebrile.  Gradually feeling better, but still some throbbing pain.  No diarr.  Abd soft, BS's present, mild subj tend w/out any guarding or peritoneal findings.  IMPR;  Resolving acute diverticulitis  PLAN:  1. Probiotics to help prevent c diff 2. Recheck CBC 3. Adv to full liq diet 4. Possible transition to po antbx in a.m. If stable  Cleotis Nipper, M.D. Pager [phone] If no answer or after 5 PM call [phone]

## 2016-03-23 NOTE — Progress Notes (Signed)
Pt. seen earlier in shift concerning home CPAP, made aware to notify if anything needed.

## 2016-03-23 NOTE — Progress Notes (Addendum)
PROGRESS NOTE    [name]  [id number] DOB: Oct 21, 1955 DOA: 03/19/2016  PCP: Hoyle Sauer, MD   Brief Narrative:  60 y.o. female with medical history significant for DM, diverticulosis, presenting with 1 week history of abdominal pain in left lower quadrent associated with diarrhea and nausea. She started Doxycycline on 10/6 but her symptoms progressed, she presented to the ER and was found to have sigmoid diverticulitis on CT.   Subjective: Left lower quadrant pain is better but still intermittent better. Wants to have diet advanced.   Assessment & Plan:   Principal Problem:   Sigmoid diverticulitis/ leukocytosis - currently on Cipro and flagyl day #5 - wants to have diet advanced a bit, OK to try but I told her if she has more nausea, will go back to clears  - did not have BM since weekend, will give Miralax   Active Problems:   Irritable bowel syndrome - diarrhea on an off but did not have BM over 3-4 days - BM as noted as above     AKI (acute kidney injury) - in the setting of acute illness - resolved with IVF     Obstructive sleep apnea - CPAP    Essential hypertension - resume norvasc and Accupril     GERD - PPI    Chronic diastolic CHF (congestive heart failure) (HCC) - grade 2- ECHO- 7/17 - no signs of volume overload - monitor daily weights, strict I/O - resume lasix in AM    Obesity   - Body mass index is 33.28 kg/m.  DVT prophylaxis: Lovenox Code Status: Full code Family Communication: husband and pt at bedside  Disposition Plan: home in 2-3 days  Consultants:   GI Procedures:   None  Antimicrobials:  Anti-infectives    Start     Dose/Rate Route Frequency Ordered Stop   03/23/16 1145  metroNIDAZOLE (FLAGYL) tablet 500 mg     500 mg Oral 3 times daily 03/23/16 1135     03/19/16 2200  ciprofloxacin (CIPRO) IVPB 400 mg     400 mg 200 mL/hr over 60 Minutes Intravenous Every 12 hours 03/19/16 1215     03/19/16 1800   metroNIDAZOLE (FLAGYL) IVPB 500 mg  Status:  Discontinued     500 mg 100 mL/hr over 60 Minutes Intravenous Every 8 hours 03/19/16 1104 03/23/16 1135   03/19/16 1300  ciprofloxacin (CIPRO) IVPB 400 mg  Status:  Discontinued     400 mg 200 mL/hr over 60 Minutes Intravenous Every 12 hours 03/19/16 1215 03/19/16 1215   03/19/16 1000  ciprofloxacin (CIPRO) IVPB 400 mg     400 mg 200 mL/hr over 60 Minutes Intravenous  Once 03/19/16 0950 03/19/16 1141   03/19/16 1000  metroNIDAZOLE (FLAGYL) IVPB 500 mg     500 mg 100 mL/hr over 60 Minutes Intravenous  Once 03/19/16 0950 03/19/16 1657     Objective: Vitals:   03/22/16 1016 03/22/16 1353 03/22/16 2141 03/23/16 0500  BP:  114/60 129/86 (!) 151/65  Pulse:  87 76 73  Resp:  16 18 18   Temp:  97.7 F (36.5 C) 98.5 F (36.9 C) 98.6 F (37 C)  TempSrc:  Oral Axillary   SpO2:  99% 97%   Weight: 100.1 kg (220 lb 9.6 oz)   100.7 kg (222 lb 1.6 oz)    Intake/Output Summary (Last 24 hours) at 03/23/16 1316 Last data filed at 03/23/16 0400  Gross per 24 hour  Intake  2736 ml  Output             2000 ml  Net              736 ml   Filed Weights   03/20/16 1430 03/22/16 1016 03/23/16 0500  Weight: 102 kg (224 lb 12.8 oz) 100.1 kg (220 lb 9.6 oz) 100.7 kg (222 lb 1.6 oz)    Examination: General exam: Appears comfortable  HEENT: PERRLA, oral mucosa moist, no sclera icterus or thrush Respiratory system: Clear to auscultation. Respiratory effort normal. Cardiovascular system: S1 & S2 heard, RRR.  No murmurs  Gastrointestinal system: Abdomen soft, still tender in epigastric area, nondistended. Normal bowel sound. No organomegaly Central nervous system: Alert and oriented. No focal neurological deficits. Extremities: No cyanosis, clubbing or edema  Data Reviewed: I have personally reviewed following labs and imaging studies  CBC:  Recent Labs Lab 03/19/16 0712 03/20/16 0213  WBC 13.9* 11.7*  HGB 14.6 12.8  HCT 45.6 41.4  MCV  97.0 98.6  PLT 371 224   Basic Metabolic Panel:  Recent Labs Lab 03/19/16 0712 03/20/16 0213 03/22/16 0648  NA 139 141 143  K 3.5 3.5 3.5  CL 103 102 109  CO2 24 28 23   GLUCOSE 117* 100* 88  BUN 14 9 5*  CREATININE 1.02* 1.20* 0.99  CALCIUM 9.6 8.6* 9.1   Liver Function Tests:  Recent Labs Lab 03/19/16 0712 03/20/16 0213  AST 20 26  ALT 21 26  ALKPHOS 86 79  BILITOT 1.1 1.1  PROT 7.3 5.9*  ALBUMIN 3.7 3.0*    Recent Labs Lab 03/19/16 0712  LIPASE 30   Urine analysis:    Component Value Date/Time   COLORURINE YELLOW 03/19/2016 0648   APPEARANCEUR CLEAR 03/19/2016 0648   LABSPEC 1.025 03/19/2016 0648   PHURINE 6.0 03/19/2016 0648   GLUCOSEU NEGATIVE 03/19/2016 0648   HGBUR LARGE (A) 03/19/2016 0648   BILIRUBINUR NEGATIVE 03/19/2016 0648   KETONESUR NEGATIVE 03/19/2016 0648   PROTEINUR NEGATIVE 03/19/2016 0648   UROBILINOGEN 0.2 02/18/2012 1056   NITRITE NEGATIVE 03/19/2016 0648   LEUKOCYTESUR NEGATIVE 03/19/2016 0648   Radiology Studies: No results found.  Scheduled Meds: . ciprofloxacin  400 mg Intravenous Q12H  . clonazePAM  0.5 mg Oral QHS  . enoxaparin (LOVENOX) injection  40 mg Subcutaneous Q24H  . famotidine  20 mg Oral BID  . mouth rinse  15 mL Mouth Rinse BID  . metroNIDAZOLE  500 mg Oral TID  . montelukast  10 mg Oral QHS  . polyethylene glycol  17 g Oral Daily  . rosuvastatin  10 mg Oral Daily  . saccharomyces boulardii  250 mg Oral BID   Continuous Infusions: . sodium chloride 125 mL/hr at 03/21/16 0500  . sodium chloride 125 mL/hr at 03/23/16 0728     LOS: 3 days   Time spent in minutes: 35  MAGICK-Ane Conerly, MD [facility] [phone] [email or web address] Password [email or web address] 03/23/2016, 1:16 PM

## 2016-03-24 LAB — BASIC METABOLIC PANEL
ANION GAP: 6 (ref 5–15)
BUN: 6 mg/dL (ref 6–20)
CO2: 26 mmol/L (ref 22–32)
Calcium: 8.6 mg/dL — ABNORMAL LOW (ref 8.9–10.3)
Chloride: 110 mmol/L (ref 101–111)
Creatinine, Ser: 0.92 mg/dL (ref 0.44–1.00)
GFR calc non Af Amer: >60 mL/min (ref >60)
GLUCOSE: 99 mg/dL (ref 65–99)
POTASSIUM: 3.3 mmol/L — AB (ref 3.5–5.1)
Sodium: 142 mmol/L (ref 135–145)

## 2016-03-24 LAB — CBC WITH DIFFERENTIAL/PLATELET
BASOS ABS: 0.1 10*3/uL (ref 0.0–0.1)
Basophils Relative: 1 %
EOS PCT: 6 %
Eosinophils Absolute: 0.4 10*3/uL (ref 0.0–0.7)
HEMATOCRIT: 38.3 % (ref 36.0–46.0)
HEMOGLOBIN: 12.4 g/dL (ref 12.0–15.0)
LYMPHS PCT: 36 %
Lymphs Abs: 2.4 10*3/uL (ref 0.7–4.0)
MCH: 30.7 pg (ref 26.0–34.0)
MCHC: 32.4 g/dL (ref 30.0–36.0)
MCV: 94.8 fL (ref 78.0–100.0)
MONOS PCT: 16 %
Monocytes Absolute: 1.1 10*3/uL — ABNORMAL HIGH (ref 0.1–1.0)
NEUTROS PCT: 41 %
Neutro Abs: 2.6 10*3/uL (ref 1.7–7.7)
Platelets: 321 10*3/uL (ref 150–400)
RBC: 4.04 MIL/uL (ref 3.87–5.11)
RDW: 13.6 % (ref 11.5–15.5)
WBC: 6.6 10*3/uL (ref 4.0–10.5)

## 2016-03-24 MED ORDER — CIPROFLOXACIN HCL 500 MG PO TABS: 500.0000 mg | ORAL_TABLET | Freq: Two times a day (BID) | ORAL | 0 refills | Status: DC

## 2016-03-24 MED ORDER — ONDANSETRON HCL 4 MG PO TABS: 4.0000 mg | ORAL_TABLET | Freq: Four times a day (QID) | ORAL | 0 refills | Status: DC | PRN

## 2016-03-24 MED ORDER — LISINOPRIL 20 MG PO TABS
20.0000 mg | ORAL_TABLET | Freq: Every day | ORAL | Status: DC
  Filled 2016-03-24: qty 1

## 2016-03-24 MED ORDER — METRONIDAZOLE 500 MG PO TABS: 500.0000 mg | ORAL_TABLET | Freq: Three times a day (TID) | ORAL | 0 refills | Status: DC

## 2016-03-24 MED ORDER — SACCHAROMYCES BOULARDII 250 MG PO CAPS: 250.0000 mg | ORAL_CAPSULE | Freq: Two times a day (BID) | ORAL | 0 refills | Status: DC

## 2016-03-24 NOTE — Discharge Summary (Signed)
Physician Discharge Summary  [name] ZOX:[id number] DOB: 1955/11/22 DOA: 03/19/2016  PCP: Hoyle Sauer, MD  Admit date: 03/19/2016 Discharge date: 03/24/2016  Recommendations for Outpatient Follow-up:  1. Pt will need to follow up with PCP in 2 weeks post discharge 2. Please obtain BMP to evaluate electrolytes and kidney function 3. Please also check CBC to evaluate Hg and Hct levels 4. Pt has an appointment with Howie Ill, GI PA on October 16th, 2017 at 8:15 am  Discharge Diagnoses:  Principal Problem:   Sigmoid diverticulitis Active Problems:   Obstructive sleep apnea   Essential hypertension   GERD  Discharge Condition: Stable  Diet recommendation: low fiber and slowly advance   Brief Narrative:  60 y.o.femalewith medical history significant for DM, diverticulosis, presenting with 1 week history of abdominal pain in left lower quadrent associated with diarrhea and nausea. She started Doxycycline on 10/6 but her symptoms progressed, she presented to the ER and was found to have sigmoid diverticulitis on CT.   Subjective: Left lower quadrant pain is better but still intermittent  Assessment & Plan:   Principal Problem:   Sigmoid diverticulitis/ leukocytosis - currently on Cipro and flagyl day #6/14 - diet advanced and tolerating well   Active Problems:   Irritable bowel syndrome - diarrhea on an off but did not have BM over 3-4 days - now had BM this AM    AKI (acute kidney injury) - in the setting of acute illness - resolved with IVF     Obstructive sleep apnea - CPAP    Essential hypertension - resume norvasc and Accupril     GERD - PPI    Chronic diastolic CHF (congestive heart failure) (HCC) - grade 2- ECHO- 7/17 - no signs of volume overload - resume home regimen lasix    Obesity   - Body mass index is 33.28 kg/m.  DVT prophylaxis: Lovenox Code Status: Full code Family Communication: husband and pt at bedside   Disposition Plan: home   Consultants:   GI Procedures:   None   Procedures/Studies: Ct Abdomen Pelvis Wo Contrast  Result Date: 03/19/2016 CLINICAL DATA:  Lower abdominal pain and nausea over the last week. Clinical suspicion of diverticulitis or appendicitis. EXAM: CT ABDOMEN AND PELVIS WITHOUT CONTRAST TECHNIQUE: Multidetector CT imaging of the abdomen and pelvis was performed following the standard protocol without IV contrast. COMPARISON:  02/27/2014 FINDINGS: Lower chest: Normal Hepatobiliary: Liver parenchyma is normal without contrast. Previous cholecystectomy. Pancreas: Normal Spleen: Previous splenectomy. Adrenals/Urinary Tract: Adrenal glands are normal. Kidneys are normal. No cyst, mass, stone or hydronephrosis. Mild fullness of the right renal collecting system is chronic and normal. Stomach/Bowel: Diverticulosis of the sigmoid colon. Diverticulitis in the sigmoid region to the right of midline with some inflammatory change of the fat but no evidence of abscess, free fluid or free air. I do not see an appendix, either normal or abnormal. Vascular/Lymphatic: Aortic atherosclerosis. No aneurysm. The IVC is normal. No retroperitoneal adenopathy or mass. Reproductive: Previous hysterectomy. Other: None Musculoskeletal: Ordinary mild lower lumbar degenerative changes. IMPRESSION: Acute diverticulitis in the sigmoid colon region. Edema in the fat, but no evidence of abscess, free fluid or free air. Electronically Signed   By: Paulina Fusi M.D.   On: 03/19/2016 09:17     Discharge Exam: Vitals:   03/24/16 0612 03/24/16 1125  BP: (!) 96/52 126/77  Pulse: 70 72  Resp: 18 20  Temp: 98.1 F (36.7 C) 97.7 F (36.5 C)   Vitals:  03/23/16 2200 03/24/16 0500 03/24/16 0612 03/24/16 1125  BP: 97/60  (!) 96/52 126/77  Pulse: 74  70 72  Resp: 18  18 20   Temp: 98.4 F (36.9 C)  98.1 F (36.7 C) 97.7 F (36.5 C)  TempSrc: Oral  Oral Oral  SpO2: 97%  97% 99%  Weight:  101.1 kg (222 lb  14.2 oz)    Height:  5\' 8"  (1.727 m)      General: Pt is alert, follows commands appropriately, not in acute distress Cardiovascular: Regular rate and rhythm, S1/S2 +, no murmurs, no rubs, no gallops Respiratory: Clear to auscultation bilaterally, no wheezing, no crackles, no rhonchi Abdominal: Soft, non tender, non distended, bowel sounds +, no guarding Extremities: no edema, no cyanosis, pulses palpable bilaterally DP and PT Neuro: Grossly nonfocal  Discharge Instructions  Discharge Instructions    Diet - low sodium heart healthy    Complete by:  As directed    Increase activity slowly    Complete by:  As directed        Medication List    STOP taking these medications   doxycycline 100 MG capsule Commonly known as:  VIBRAMYCIN     TAKE these medications   ADVAIR DISKUS 500-50 MCG/DOSE Aepb Generic drug:  Fluticasone-Salmeterol Inhale 500 mcg into the lungs as needed (shortness of breath).   amLODipine 5 MG tablet Commonly known as:  NORVASC Take 5 mg by mouth daily.   ciprofloxacin 500 MG tablet Commonly known as:  CIPRO Take 1 tablet (500 mg total) by mouth 2 (two) times daily.   clobetasol cream 0.05 % Commonly known as:  TEMOVATE Apply 1 application topically 2 (two) times daily.   clonazePAM 0.5 MG tablet Commonly known as:  KLONOPIN Take 0.5 mg by mouth at bedtime. For sleep   CRESTOR 20 MG tablet Generic drug:  rosuvastatin Take 10 mg by mouth daily.   EPIPEN 2-PAK 0.3 mg/0.3 mL Soaj injection Generic drug:  EPINEPHrine Inject 0.3 mLs as directed as needed.   furosemide 40 MG tablet Commonly known as:  LASIX Take 40 mg by mouth daily.   HYDROcodone-acetaminophen 5-325 MG tablet Commonly known as:  NORCO/VICODIN Take 1 tablet by mouth as needed for moderate pain.   methocarbamol 500 MG tablet Commonly known as:  ROBAXIN Take 500 mg by mouth 3 (three) times daily as needed for muscle spasms.   metroNIDAZOLE 500 MG tablet Commonly known as:   FLAGYL Take 1 tablet (500 mg total) by mouth 3 (three) times daily.   montelukast 10 MG tablet Commonly known as:  SINGULAIR Take 10 mg by mouth at bedtime.   multivitamin with minerals Tabs tablet Take 1 tablet by mouth daily.   NEXIUM 40 MG capsule Generic drug:  esomeprazole Take 40 mg by mouth daily.   ondansetron 4 MG tablet Commonly known as:  ZOFRAN Take 1 tablet (4 mg total) by mouth every 6 (six) hours as needed for nausea.   quinapril 40 MG tablet Commonly known as:  ACCUPRIL Take 40 mg by mouth daily.   ranitidine 150 MG tablet Commonly known as:  ZANTAC Take 150 mg by mouth at bedtime.   saccharomyces boulardii 250 MG capsule Commonly known as:  FLORASTOR Take 1 capsule (250 mg total) by mouth 2 (two) times daily.   VENTOLIN HFA 108 (90 Base) MCG/ACT inhaler Generic drug:  albuterol Inhale 1 puff into the lungs as needed.      Follow-up Information    Hoyle Sauer, MD .  Specialty:  Internal Medicine Contact information: [address] [place] [address] [phone]        Debbora Presto, MD .   Specialty:  Internal Medicine Why:  call my cell phone [phone] Contact information: [address] [place] [address] [phone]        Celso Amy, Cordelia Poche Follow up on 03/28/2016.   Specialty:  Physician Assistant Why:  Appointment is scheduled for 8:15 am on October 16th 2017 Contact information: [address] [place] [address] [phone]            The results of significant diagnostics from this hospitalization (including imaging, microbiology, ancillary and laboratory) are listed below for reference.     Microbiology: No results found for this or any previous visit (from the past 240 hour(s)).   Labs: Basic Metabolic Panel:  Recent Labs Lab 03/19/16 0712 03/20/16 0213 03/22/16 0648 03/24/16 0511  NA 139 141 143 142  K 3.5 3.5 3.5 3.3*  CL 103 102 109 110  CO2 24  28 23 26   GLUCOSE 117* 100* 88 99  BUN 14 9 5* 6  CREATININE 1.02* 1.20* 0.99 0.92  CALCIUM 9.6 8.6* 9.1 8.6*   Liver Function Tests:  Recent Labs Lab 03/19/16 0712 03/20/16 0213  AST 20 26  ALT 21 26  ALKPHOS 86 79  BILITOT 1.1 1.1  PROT 7.3 5.9*  ALBUMIN 3.7 3.0*    Recent Labs Lab 03/19/16 0712  LIPASE 30   No results for input(s): AMMONIA in the last 168 hours. CBC:  Recent Labs Lab 03/19/16 0712 03/20/16 0213 03/24/16 0511  WBC 13.9* 11.7* 6.6  NEUTROABS  --   --  2.6  HGB 14.6 12.8 12.4  HCT 45.6 41.4 38.3  MCV 97.0 98.6 94.8  PLT 371 224 321    SIGNED: Time coordinating discharge: 30 minutes  Katrina Richardson, MD  [facility] 03/24/2016, 11:28 AM Pager [phone]  If 7PM-7AM, please contact night-coverage [email or web address] Password [email or web address]

## 2016-03-24 NOTE — Progress Notes (Signed)
Patient discharged to home with instructions and papers for works given.

## 2016-03-24 NOTE — Progress Notes (Signed)
Feels ready to go home.  Abd essent nontender.  Looks well.  Vomited after lunch yest, but had light dinner last night and light breakfast this morning w/out problem.  WBC nl 6.6  IMPR:  1. Acute sigmoid diverticulitis, first-ever attack, CT-confirmed, improved  PLAN:  1. D/w Dr. Rush Farmer agree pt is ok for dischg  2. Recomm f/u w/ Deliah Goody, PA-C in our office for about 1 - 2 weeks from now  3. Gen'l diet recomm's provided  Cleotis Nipper, M.D. Pager [phone] If no answer or after 5 PM call [phone]

## 2016-05-11 ENCOUNTER — Other Ambulatory Visit: Payer: Self-pay | Admitting: Physician Assistant

## 2016-05-11 DIAGNOSIS — R1031 Right lower quadrant pain: Secondary | ICD-10-CM

## 2016-05-13 ENCOUNTER — Ambulatory Visit
Admission: RE | Admit: 2016-05-13 | Discharge: 2016-05-13 | Disposition: A | Discharge status: Home / Self Care | Payer: 59 | Source: Ambulatory Visit | Attending: Physician Assistant | Admitting: Physician Assistant

## 2016-05-13 DIAGNOSIS — R1031 Right lower quadrant pain: Secondary | ICD-10-CM

## 2016-06-14 DIAGNOSIS — K5732 Diverticulitis of large intestine without perforation or abscess without bleeding: Secondary | ICD-10-CM | POA: Diagnosis not present

## 2016-06-14 DIAGNOSIS — Z8371 Family history of colonic polyps: Secondary | ICD-10-CM | POA: Diagnosis not present

## 2016-06-14 DIAGNOSIS — R1084 Generalized abdominal pain: Secondary | ICD-10-CM | POA: Diagnosis not present

## 2016-06-30 DIAGNOSIS — E1151 Type 2 diabetes mellitus with diabetic peripheral angiopathy without gangrene: Secondary | ICD-10-CM | POA: Diagnosis not present

## 2016-06-30 DIAGNOSIS — I10 Essential (primary) hypertension: Secondary | ICD-10-CM | POA: Diagnosis not present

## 2016-08-12 DIAGNOSIS — K5730 Diverticulosis of large intestine without perforation or abscess without bleeding: Secondary | ICD-10-CM | POA: Diagnosis not present

## 2016-08-12 DIAGNOSIS — R1031 Right lower quadrant pain: Secondary | ICD-10-CM | POA: Diagnosis not present

## 2016-08-12 DIAGNOSIS — R1013 Epigastric pain: Secondary | ICD-10-CM | POA: Diagnosis not present

## 2016-08-26 DIAGNOSIS — R1319 Other dysphagia: Secondary | ICD-10-CM | POA: Diagnosis not present

## 2016-08-26 DIAGNOSIS — J039 Acute tonsillitis, unspecified: Secondary | ICD-10-CM | POA: Diagnosis not present

## 2016-09-11 HISTORY — PX: BREAST CYST ASPIRATION: SHX578

## 2016-09-19 ENCOUNTER — Other Ambulatory Visit: Payer: Self-pay | Admitting: Internal Medicine

## 2016-09-19 DIAGNOSIS — E1151 Type 2 diabetes mellitus with diabetic peripheral angiopathy without gangrene: Secondary | ICD-10-CM | POA: Diagnosis not present

## 2016-09-19 DIAGNOSIS — N6002 Solitary cyst of left breast: Secondary | ICD-10-CM | POA: Diagnosis not present

## 2016-09-19 DIAGNOSIS — I10 Essential (primary) hypertension: Secondary | ICD-10-CM | POA: Diagnosis not present

## 2016-09-19 DIAGNOSIS — N6320 Unspecified lump in the left breast, unspecified quadrant: Secondary | ICD-10-CM

## 2016-09-23 ENCOUNTER — Ambulatory Visit
Admission: RE | Admit: 2016-09-23 | Discharge: 2016-09-23 | Disposition: A | Discharge status: Home / Self Care | Payer: 59 | Source: Ambulatory Visit | Attending: Internal Medicine | Admitting: Internal Medicine

## 2016-09-23 ENCOUNTER — Other Ambulatory Visit: Payer: 59

## 2016-09-23 ENCOUNTER — Other Ambulatory Visit: Payer: Self-pay | Admitting: Internal Medicine

## 2016-09-23 DIAGNOSIS — R1013 Epigastric pain: Secondary | ICD-10-CM | POA: Diagnosis not present

## 2016-09-23 DIAGNOSIS — N6002 Solitary cyst of left breast: Secondary | ICD-10-CM

## 2016-09-23 DIAGNOSIS — R928 Other abnormal and inconclusive findings on diagnostic imaging of breast: Secondary | ICD-10-CM | POA: Diagnosis not present

## 2016-09-23 DIAGNOSIS — N6320 Unspecified lump in the left breast, unspecified quadrant: Secondary | ICD-10-CM

## 2016-09-23 DIAGNOSIS — K5792 Diverticulitis of intestine, part unspecified, without perforation or abscess without bleeding: Secondary | ICD-10-CM | POA: Diagnosis not present

## 2016-09-23 DIAGNOSIS — N6489 Other specified disorders of breast: Secondary | ICD-10-CM | POA: Diagnosis not present

## 2016-09-27 ENCOUNTER — Ambulatory Visit
Admission: RE | Admit: 2016-09-27 | Discharge: 2016-09-27 | Disposition: A | Discharge status: Home / Self Care | Payer: 59 | Source: Ambulatory Visit | Attending: Internal Medicine | Admitting: Internal Medicine

## 2016-09-27 DIAGNOSIS — N6002 Solitary cyst of left breast: Secondary | ICD-10-CM | POA: Diagnosis not present

## 2016-09-28 ENCOUNTER — Other Ambulatory Visit: Payer: Self-pay | Admitting: Emergency Medicine

## 2016-11-08 DIAGNOSIS — I10 Essential (primary) hypertension: Secondary | ICD-10-CM | POA: Diagnosis not present

## 2016-11-08 DIAGNOSIS — E1151 Type 2 diabetes mellitus with diabetic peripheral angiopathy without gangrene: Secondary | ICD-10-CM | POA: Diagnosis not present

## 2016-12-05 DIAGNOSIS — I8310 Varicose veins of unspecified lower extremity with inflammation: Secondary | ICD-10-CM | POA: Diagnosis not present

## 2016-12-05 DIAGNOSIS — L03115 Cellulitis of right lower limb: Secondary | ICD-10-CM | POA: Diagnosis not present

## 2016-12-05 DIAGNOSIS — R600 Localized edema: Secondary | ICD-10-CM | POA: Diagnosis not present

## 2016-12-15 ENCOUNTER — Telehealth (INDEPENDENT_AMBULATORY_CARE_PROVIDER_SITE_OTHER): Payer: Self-pay | Admitting: Physical Medicine and Rehabilitation

## 2016-12-19 NOTE — Telephone Encounter (Signed)
Should be okay if there is no new trauma etc.

## 2016-12-19 NOTE — Telephone Encounter (Signed)
Scheduled for 7/17 at 1500.

## 2016-12-27 ENCOUNTER — Ambulatory Visit (INDEPENDENT_AMBULATORY_CARE_PROVIDER_SITE_OTHER): Payer: 59 | Admitting: Physical Medicine and Rehabilitation

## 2016-12-27 ENCOUNTER — Encounter (INDEPENDENT_AMBULATORY_CARE_PROVIDER_SITE_OTHER): Payer: Self-pay | Admitting: Physical Medicine and Rehabilitation

## 2016-12-27 ENCOUNTER — Ambulatory Visit (INDEPENDENT_AMBULATORY_CARE_PROVIDER_SITE_OTHER): Payer: Self-pay

## 2016-12-27 VITALS — BP 142/90

## 2016-12-27 DIAGNOSIS — G894 Chronic pain syndrome: Secondary | ICD-10-CM | POA: Diagnosis not present

## 2016-12-27 DIAGNOSIS — M5116 Intervertebral disc disorders with radiculopathy, lumbar region: Secondary | ICD-10-CM

## 2016-12-27 DIAGNOSIS — M5416 Radiculopathy, lumbar region: Secondary | ICD-10-CM

## 2016-12-27 MED ORDER — METHYLPREDNISOLONE ACETATE 80 MG/ML IJ SUSP
80.0000 mg | Freq: Once | INTRAMUSCULAR | Status: AC
  Administered 2016-12-27: 80 mg

## 2016-12-27 MED ORDER — LIDOCAINE HCL (PF) 1 % IJ SOLN
2.0000 mL | Freq: Once | INTRAMUSCULAR | Status: AC
  Administered 2016-12-27: 2 mL

## 2016-12-27 NOTE — Progress Notes (Deleted)
Pain in the center of low back. Patient states she did well with the last injection for several months. Increased pain for the last couple ofmonths. Has been having to do a lot of lifting lately with her husband and father who are both in wheelchairs and she is taking care of. Numbness and tingling in right thigh.

## 2016-12-27 NOTE — Patient Instructions (Signed)

## 2016-12-29 NOTE — Progress Notes (Signed)
[name] - 61 y.o. female MRN [id number]  Date of birth: Jul 06, 1955  Office Visit Note: Visit Date: 12/27/2016 PCP: [name], MD Referred by: [name], MD  Subjective: Chief Complaint  Patient presents with  . Lower Back - Pain   HPI: [name] is a 61 year old female that we have seen over the past several years who has done well with intralaminar epidural steroid injection at L3-4 for what is essentially some lateral recess narrowing and history of left-sided disc protrusion. MRI from 2014 which is reviewed below shows mainly facet arthropathy of the lower facet joints. She comes in today complaining of increased pain for the last several months. She's been doing exercises when she can she's been taking some anti-inflammatories and muscle relaxer. She states that she's had no specific new injury or other complaints other than just worsening pain. She feels the pain is very similar to the past. She is not having much in the way of leg pain but she is getting a little numbness and tingling in the right thigh. She's had a lot of stress in her life recently where her husband had fallen and broken his leg and is now on rehabilitation with that. She also had significant family trauma where her parents were in a car wreck and her mother died in her father is now with her and she is taking care of him. She's been doing a lot of pushing of his wheelchair and she wonders if that's flared up her back pain. Last injection was in the early part of last year and she did really well for several months and was very happy after the injection. She has failed conservative care in the past.    ROS Otherwise per HPI.  Assessment & Plan: Visit Diagnoses:  1. Lumbar radiculopathy   2. Radiculopathy due to lumbar intervertebral disc disorder   3. Chronic pain syndrome     Plan: Findings:  Chronic worsening severe at times mostly axial low back pain may be some radicular-type pain in the  right thigh. She has mostly what sounds like facet mediated low back pain but she has done well with prior epidural injection. She's failed conservative care in the past and continues to work with what she has learned over the time for that. She's had no new specific injury. I will repeat the L3-4 intralaminar epidural steroid injection from a diagnostic and hopefully therapeutic standpoint. Depending on that relief would look at facet joint blocks versus updating her MRI. We did complete the injection today due to the significant amount of pain she is having.    Meds & Orders:  Meds ordered this encounter  Medications  . lidocaine (PF) (XYLOCAINE) 1 % injection 2 mL  . methylPREDNISolone acetate (DEPO-MEDROL) injection 80 mg    Orders Placed This Encounter  Procedures  . XR C-ARM NO REPORT  . Epidural Steroid injection    Follow-up: Return if symptoms worsen or fail to improve, for Consider Facet Block versus MRI.   Procedures: No procedures performed  Lumbar Epidural Steroid Injection - Interlaminar Approach with Fluoroscopic Guidance  Patient: [name]      Date of Birth: [birth year] MRN: [id number] PCP: [name], MD      Visit Date: 12/27/2016   Universal Protocol:    Date/Time: 07/19/185:26 AM  Consent Given By: the patient  Position: PRONE  Additional Comments: Vital signs were monitored before and after the procedure. Patient was prepped and draped in the usual  sterile fashion. The correct patient, procedure, and site was verified.   Injection Procedure Details:  Procedure Site One Meds Administered:  Meds ordered this encounter  Medications  . lidocaine (PF) (XYLOCAINE) 1 % injection 2 mL  . methylPREDNISolone acetate (DEPO-MEDROL) injection 80 mg     Laterality: Bilateral  Location/Site:  L3-L4  Needle size: 20 G  Needle type: Tuohy  Needle Placement: Paramedian epidural  Findings:  -Contrast Used: 0.5 mL iohexol 180 mg iodine/mL    -Comments: Excellent flow of contrast into the epidural space.  Procedure Details: Using a paramedian approach from the side mentioned above, the region overlying the inferior lamina was localized under fluoroscopic visualization and the soft tissues overlying this structure were infiltrated with 4 ml. of 1% Lidocaine without Epinephrine. The Tuohy needle was inserted into the epidural space using a paramedian approach.   The epidural space was localized using loss of resistance along with lateral and bi-planar fluoroscopic views.  After negative aspirate for air, blood, and CSF, a 2 ml. volume of Isovue-250 was injected into the epidural space and the flow of contrast was observed. Radiographs were obtained for documentation purposes.    The injectate was administered into the level noted above.   Additional Comments:  The patient tolerated the procedure well No complications occurred Dressing: Band-Aid    Post-procedure details: Patient was observed during the procedure. Post-procedure instructions were reviewed.  Patient left the clinic in stable condition.     Clinical History: MRI LUMBAR SPINE WITHOUT CONTRAST   TECHNIQUE: Multiplanar, multisequence MR imaging was performed. No intravenous contrast was administered.   COMPARISON: Prior CT from 12/28/2012   FINDINGS: For the purposes of this dictation, the lowest well-formed intervertebral disc spaces presumed to be the L5-S1 level, and there presumed to be 5 lumbar type vertebral bodies.   Vertebral bodies are normally aligned with preservation of the normal lumbar lordosis. Vertebral body heights are preserved.   Signal intensity within the vertebral body bone marrow somewhat patchy and heterogeneous without discrete osseous lesion. The visualized spinal cord is within normal limits. The conus terminates at the superior aspect of L1.   At T12-L1, there is no disc bulge or disc protrusion. No significant facet  arthrosis. No canal or neural foraminal stenosis.   At L1-2 there is mild degenerative disc desiccation with minimal disc bulge. No significant facet disease. No canal or neural foraminal stenosis. Chronic degenerative Schmorl's nodes are present about the L1-2 intervertebral disc space.   At L2-3, there is mild degenerative disc desiccation with disc bulge. No focal disc protrusion. There is left greater than right moderate facet arthrosis. No significant central canal or foraminal stenosis identified.   At L3-4 mild circumferential disk bulge with disk desiccation. No focal disc protrusion identified. Moderate bilateral facet arthrosis is present, left greater than right. And annular fissure is present posteriorly of the bulging disk. There is no significant canal or neural foraminal stenosis. The hypertrophied left facet at this level encroaches minimally upon the left lateral recess. The   At L4-5, there is mild degenerative disc desiccation without focal disc protrusion or significant disc bulge. Small annular fissure noted posteriorly. Left greater than right facet arthropathy is present. There is no significant canal or foraminal narrowing.   At L5-S1, there is moderate left with mild right facet arthropathy with diffuse degenerative disc bulge and disk desiccation. An annular fissure is present at the central aspect of the bulging disc posteriorly. No focal disk herniation. There is mild  left foraminal narrowing at this level. No significant central canal or right neural foraminal stenosis.   The visualized paraspinous soft tissues are within normal limits. Visualized visceral structures are unremarkable. No retroperitoneal adenopathy.   IMPRESSION: 1. Mild multilevel degenerative disc bulging without focal disc protrusion. There is mild left-sided foraminal narrowing at the L5-S1 level. No other significant canal or foraminal stenosis identified. 2. Multilevel  moderate bilateral facet arthropathy, left greater than right. Facet disease is most severe at the L4-5 and L5-S1 level on the left. 3. Annular fissures at the posterior aspects of the bulging L3-4, L4-5, and L5-S1 discs.     Electronically Signed By: Jeannine Boga M.D. On: 05/04/2013 23:08  She reports that she has never smoked. She has never used smokeless tobacco. No results for input(s): HGBA1C, LABURIC in the last 8760 hours.  Objective:  VS:  HT:    WT:   BMI:     BP:(!) 142/90  HR: bpm  TEMP: ( )  RESP:99 % Physical Exam  Musculoskeletal:  Patient ambulates without aid with good distal strength. She has some pain with extension rotation of the lumbar spine which is concordant with her pain. She has no pain with hip rotation.    Ortho Exam Imaging: No results found.  Past Medical/Family/Surgical/Social History: Medications & Allergies reviewed per EMR Patient Active Problem List   Diagnosis Date Noted  . AKI (acute kidney injury) ([place]) 03/20/2016  . DM type 2, goal HbA1c < 7% (HCC) 03/20/2016  . Chronic diastolic CHF (congestive heart failure) ([place]) 03/20/2016  . Sigmoid diverticulitis 03/20/2016  . Diverticulitis 03/20/2016  . Leukocytosis 03/19/2016  . Chest pain 08/31/2013  . CHEST PAIN-UNSPECIFIED 08/04/2009  . THYROID NODULE, LEFT 07/30/2009  . Diabetes mellitus ([place]) 07/30/2009  . HYPERLIPIDEMIA 07/30/2009  . Obstructive sleep apnea 07/30/2009  . Essential hypertension 07/30/2009  . GERD 07/30/2009  . Irritable bowel syndrome 07/30/2009   Past Medical History:  Diagnosis Date  . Complication of anesthesia    " I have to be awake to be intubated because I have a mass in my throut "  . Diabetes mellitus   . Difficult intubation    mass in throut  . Diverticulitis   . Hyperlipidemia   . Hypertension   . Migraines 2013   Family History  Problem Relation Age of Onset  . Diabetes Father   . Hypertension Father    Past Surgical History:    Procedure Laterality Date  . ABDOMINAL HYSTERECTOMY    . GALLBLADDER SURGERY    . KNEE ARTHROSCOPY    . lymph nodectomy    . NASAL SINUS SURGERY    . SPLENIC ARTERY EMBOLIZATION     due to Aneurysm  . TONSILLECTOMY     Social History   Occupational History  . Not on file.   Social History Main Topics  . Smoking status: Never Smoker  . Smokeless tobacco: Never Used  . Alcohol use No  . Drug use: No  . Sexual activity: Yes    Birth control/ protection: Surgical     Comment: hysterectomy

## 2016-12-29 NOTE — Procedures (Signed)
Lumbar Epidural Steroid Injection - Interlaminar Approach with Fluoroscopic Guidance  Patient: [name]      Date of Birth: 02-Jul-1955 MRN: [id number] PCP: [name], MD      Visit Date: 12/27/2016   Universal Protocol:    Date/Time: 07/19/185:26 AM  Consent Given By: the patient  Position: PRONE  Additional Comments: Vital signs were monitored before and after the procedure. Patient was prepped and draped in the usual sterile fashion. The correct patient, procedure, and site was verified.   Injection Procedure Details:  Procedure Site One Meds Administered:  Meds ordered this encounter  Medications  . lidocaine (PF) (XYLOCAINE) 1 % injection 2 mL  . methylPREDNISolone acetate (DEPO-MEDROL) injection 80 mg     Laterality: Bilateral  Location/Site:  L3-L4  Needle size: 20 G  Needle type: Tuohy  Needle Placement: Paramedian epidural  Findings:  -Contrast Used: 0.5 mL iohexol 180 mg iodine/mL   -Comments: Excellent flow of contrast into the epidural space.  Procedure Details: Using a paramedian approach from the side mentioned above, the region overlying the inferior lamina was localized under fluoroscopic visualization and the soft tissues overlying this structure were infiltrated with 4 ml. of 1% Lidocaine without Epinephrine. The Tuohy needle was inserted into the epidural space using a paramedian approach.   The epidural space was localized using loss of resistance along with lateral and bi-planar fluoroscopic views.  After negative aspirate for air, blood, and CSF, a 2 ml. volume of Isovue-250 was injected into the epidural space and the flow of contrast was observed. Radiographs were obtained for documentation purposes.    The injectate was administered into the level noted above.   Additional Comments:  The patient tolerated the procedure well No complications occurred Dressing: Band-Aid    Post-procedure details: Patient was observed during  the procedure. Post-procedure instructions were reviewed.  Patient left the clinic in stable condition.

## 2017-02-16 DIAGNOSIS — L821 Other seborrheic keratosis: Secondary | ICD-10-CM | POA: Diagnosis not present

## 2017-02-16 DIAGNOSIS — D224 Melanocytic nevi of scalp and neck: Secondary | ICD-10-CM | POA: Diagnosis not present

## 2017-02-16 DIAGNOSIS — D225 Melanocytic nevi of trunk: Secondary | ICD-10-CM | POA: Diagnosis not present

## 2017-02-16 DIAGNOSIS — L820 Inflamed seborrheic keratosis: Secondary | ICD-10-CM | POA: Diagnosis not present

## 2017-03-22 DIAGNOSIS — E048 Other specified nontoxic goiter: Secondary | ICD-10-CM | POA: Diagnosis not present

## 2017-03-22 DIAGNOSIS — E1151 Type 2 diabetes mellitus with diabetic peripheral angiopathy without gangrene: Secondary | ICD-10-CM | POA: Diagnosis not present

## 2017-03-22 DIAGNOSIS — Z0000 Encounter for general adult medical examination without abnormal findings: Secondary | ICD-10-CM | POA: Diagnosis not present

## 2017-03-29 DIAGNOSIS — G459 Transient cerebral ischemic attack, unspecified: Secondary | ICD-10-CM | POA: Diagnosis not present

## 2017-03-29 DIAGNOSIS — Z0000 Encounter for general adult medical examination without abnormal findings: Secondary | ICD-10-CM | POA: Diagnosis not present

## 2017-03-29 DIAGNOSIS — Z1389 Encounter for screening for other disorder: Secondary | ICD-10-CM | POA: Diagnosis not present

## 2017-03-29 DIAGNOSIS — E1151 Type 2 diabetes mellitus with diabetic peripheral angiopathy without gangrene: Secondary | ICD-10-CM | POA: Diagnosis not present

## 2017-05-02 DIAGNOSIS — K5900 Constipation, unspecified: Secondary | ICD-10-CM | POA: Diagnosis not present

## 2017-05-02 DIAGNOSIS — K5732 Diverticulitis of large intestine without perforation or abscess without bleeding: Secondary | ICD-10-CM | POA: Diagnosis not present

## 2017-05-02 DIAGNOSIS — R1013 Epigastric pain: Secondary | ICD-10-CM | POA: Diagnosis not present

## 2017-05-08 ENCOUNTER — Other Ambulatory Visit: Payer: Self-pay | Admitting: Physician Assistant

## 2017-05-08 DIAGNOSIS — R1012 Left upper quadrant pain: Secondary | ICD-10-CM | POA: Diagnosis not present

## 2017-05-08 DIAGNOSIS — K5732 Diverticulitis of large intestine without perforation or abscess without bleeding: Secondary | ICD-10-CM | POA: Diagnosis not present

## 2017-05-08 DIAGNOSIS — R1010 Upper abdominal pain, unspecified: Secondary | ICD-10-CM | POA: Diagnosis not present

## 2017-05-11 ENCOUNTER — Ambulatory Visit
Admission: RE | Admit: 2017-05-11 | Discharge: 2017-05-11 | Disposition: A | Discharge status: Home / Self Care | Payer: 59 | Source: Ambulatory Visit | Attending: Physician Assistant | Admitting: Physician Assistant

## 2017-05-11 DIAGNOSIS — K760 Fatty (change of) liver, not elsewhere classified: Secondary | ICD-10-CM | POA: Diagnosis not present

## 2017-05-11 DIAGNOSIS — R1012 Left upper quadrant pain: Secondary | ICD-10-CM

## 2017-05-16 DIAGNOSIS — G43709 Chronic migraine without aura, not intractable, without status migrainosus: Secondary | ICD-10-CM | POA: Diagnosis not present

## 2017-05-16 DIAGNOSIS — G4752 REM sleep behavior disorder: Secondary | ICD-10-CM | POA: Diagnosis not present

## 2017-08-09 DIAGNOSIS — H52211 Irregular astigmatism, right eye: Secondary | ICD-10-CM | POA: Diagnosis not present

## 2017-08-09 DIAGNOSIS — M3501 Sicca syndrome with keratoconjunctivitis: Secondary | ICD-10-CM | POA: Diagnosis not present

## 2017-08-09 DIAGNOSIS — H18461 Peripheral corneal degeneration, right eye: Secondary | ICD-10-CM | POA: Diagnosis not present

## 2017-08-09 DIAGNOSIS — H16421 Pannus (corneal), right eye: Secondary | ICD-10-CM | POA: Diagnosis not present

## 2017-08-18 DIAGNOSIS — R05 Cough: Secondary | ICD-10-CM | POA: Diagnosis not present

## 2017-08-18 DIAGNOSIS — E1151 Type 2 diabetes mellitus with diabetic peripheral angiopathy without gangrene: Secondary | ICD-10-CM | POA: Diagnosis not present

## 2017-08-18 DIAGNOSIS — J209 Acute bronchitis, unspecified: Secondary | ICD-10-CM | POA: Diagnosis not present

## 2017-08-23 DIAGNOSIS — H01021 Squamous blepharitis right upper eyelid: Secondary | ICD-10-CM | POA: Diagnosis not present

## 2017-08-23 DIAGNOSIS — H16421 Pannus (corneal), right eye: Secondary | ICD-10-CM | POA: Diagnosis not present

## 2017-08-23 DIAGNOSIS — H18451 Nodular corneal degeneration, right eye: Secondary | ICD-10-CM | POA: Diagnosis not present

## 2017-08-29 DIAGNOSIS — E1151 Type 2 diabetes mellitus with diabetic peripheral angiopathy without gangrene: Secondary | ICD-10-CM | POA: Diagnosis not present

## 2017-08-29 DIAGNOSIS — J209 Acute bronchitis, unspecified: Secondary | ICD-10-CM | POA: Diagnosis not present

## 2017-08-29 DIAGNOSIS — Z1389 Encounter for screening for other disorder: Secondary | ICD-10-CM | POA: Diagnosis not present

## 2017-08-29 DIAGNOSIS — I10 Essential (primary) hypertension: Secondary | ICD-10-CM | POA: Diagnosis not present

## 2017-10-13 ENCOUNTER — Other Ambulatory Visit: Payer: Self-pay

## 2017-10-13 ENCOUNTER — Emergency Department (HOSPITAL_COMMUNITY)
Admission: EM | Admit: 2017-10-13 | Discharge: 2017-10-14 | Disposition: A | Discharge status: Home / Self Care | Payer: 59 | Attending: Emergency Medicine | Admitting: Emergency Medicine

## 2017-10-13 ENCOUNTER — Encounter (HOSPITAL_COMMUNITY): Payer: Self-pay

## 2017-10-13 DIAGNOSIS — K5732 Diverticulitis of large intestine without perforation or abscess without bleeding: Secondary | ICD-10-CM | POA: Diagnosis not present

## 2017-10-13 DIAGNOSIS — Z79899 Other long term (current) drug therapy: Secondary | ICD-10-CM | POA: Insufficient documentation

## 2017-10-13 DIAGNOSIS — R102 Pelvic and perineal pain: Secondary | ICD-10-CM | POA: Diagnosis not present

## 2017-10-13 DIAGNOSIS — R1030 Lower abdominal pain, unspecified: Secondary | ICD-10-CM

## 2017-10-13 DIAGNOSIS — K5730 Diverticulosis of large intestine without perforation or abscess without bleeding: Secondary | ICD-10-CM | POA: Insufficient documentation

## 2017-10-13 DIAGNOSIS — I10 Essential (primary) hypertension: Secondary | ICD-10-CM | POA: Insufficient documentation

## 2017-10-13 DIAGNOSIS — E119 Type 2 diabetes mellitus without complications: Secondary | ICD-10-CM | POA: Insufficient documentation

## 2017-10-13 LAB — URINALYSIS, ROUTINE W REFLEX MICROSCOPIC
BILIRUBIN URINE: NEGATIVE
GLUCOSE, UA: NEGATIVE mg/dL
KETONES UR: NEGATIVE mg/dL
NITRITE: NEGATIVE
PROTEIN: NEGATIVE mg/dL
Specific Gravity, Urine: 1.002 — ABNORMAL LOW (ref 1.005–1.030)
pH: 6 (ref 5.0–8.0)

## 2017-10-13 LAB — COMPREHENSIVE METABOLIC PANEL
ALT: 42 U/L (ref 14–54)
AST: 34 U/L (ref 15–41)
Albumin: 3.8 g/dL (ref 3.5–5.0)
Alkaline Phosphatase: 86 U/L (ref 38–126)
Anion gap: 12 (ref 5–15)
BUN: 9 mg/dL (ref 6–20)
CHLORIDE: 98 mmol/L — AB (ref 101–111)
CO2: 27 mmol/L (ref 22–32)
Calcium: 9.2 mg/dL (ref 8.9–10.3)
Creatinine, Ser: 1.13 mg/dL — ABNORMAL HIGH (ref 0.44–1.00)
GFR, EST AFRICAN AMERICAN: 58 mL/min — AB (ref >60)
GFR, EST NON AFRICAN AMERICAN: 50 mL/min — AB (ref >60)
Glucose, Bld: 174 mg/dL — ABNORMAL HIGH (ref 65–99)
POTASSIUM: 3.4 mmol/L — AB (ref 3.5–5.1)
Sodium: 137 mmol/L (ref 135–145)
TOTAL PROTEIN: 6.7 g/dL (ref 6.5–8.1)
Total Bilirubin: 0.8 mg/dL (ref 0.3–1.2)

## 2017-10-13 LAB — CBC
HEMATOCRIT: 40.7 % (ref 36.0–46.0)
Hemoglobin: 13.3 g/dL (ref 12.0–15.0)
MCH: 31.1 pg (ref 26.0–34.0)
MCHC: 32.7 g/dL (ref 30.0–36.0)
MCV: 95.1 fL (ref 78.0–100.0)
PLATELETS: 361 10*3/uL (ref 150–400)
RBC: 4.28 MIL/uL (ref 3.87–5.11)
RDW: 14.4 % (ref 11.5–15.5)
WBC: 14.2 10*3/uL — AB (ref 4.0–10.5)

## 2017-10-13 LAB — LIPASE, BLOOD: LIPASE: 30 U/L (ref 11–51)

## 2017-10-13 MED ORDER — HYDROMORPHONE HCL 2 MG/ML IJ SOLN
0.5000 mg | Freq: Once | INTRAMUSCULAR | Status: AC
  Administered 2017-10-13: 0.5 mg via INTRAVENOUS
  Filled 2017-10-13: qty 1

## 2017-10-13 MED ORDER — METOCLOPRAMIDE HCL 5 MG/ML IJ SOLN
10.0000 mg | Freq: Once | INTRAMUSCULAR | Status: AC
  Administered 2017-10-13: 10 mg via INTRAVENOUS
  Filled 2017-10-13: qty 2

## 2017-10-13 NOTE — ED Notes (Signed)
Results reviewed.  No changes in acuity at this time 

## 2017-10-13 NOTE — ED Triage Notes (Addendum)
Pt endorses lower abd pain since yesterday with diarrhea and nausea. Has hx of diverticulitis and this feels the same. Called her MD and started on antibotics today, but states that her pain is worse now.

## 2017-10-13 NOTE — ED Provider Notes (Signed)
[facility] EMERGENCY DEPARTMENT Provider Note   CSN: [id number] Arrival date & time: 10/13/17  5956     History   Chief Complaint Chief Complaint  Patient presents with  . Abdominal Pain    HPI [name] is a 62 y.o. female.  Patient presents with abdominal pain across lower abdomen since earlier today. Symptoms started fairly suddenly. She reports history of diverticulitis with similar presentation. No fever. She has nausea but no vomiting today. No blood per rectum. She spoke to her doctor earlier today who started her on antibiotics in anticipation of recurrence of diverticular infection but she continued to feel significantly worse prompting ED visit.   The history is provided by the patient and the spouse. No language interpreter was used.  Abdominal Pain   Associated symptoms include nausea. Pertinent negatives include fever and vomiting.    Past Medical History:  Diagnosis Date  . Complication of anesthesia    " I have to be awake to be intubated because I have a mass in my throut "  . Diabetes mellitus   . Difficult intubation    mass in throut  . Diverticulitis   . Hyperlipidemia   . Hypertension   . Migraines 2013    Patient Active Problem List   Diagnosis Date Noted  . AKI (acute kidney injury) ([place]) 03/20/2016  . DM type 2, goal HbA1c < 7% (HCC) 03/20/2016  . Chronic diastolic CHF (congestive heart failure) ([place]) 03/20/2016  . Sigmoid diverticulitis 03/20/2016  . Diverticulitis 03/20/2016  . Leukocytosis 03/19/2016  . Chest pain 08/31/2013  . CHEST PAIN-UNSPECIFIED 08/04/2009  . THYROID NODULE, LEFT 07/30/2009  . Diabetes mellitus ([place]) 07/30/2009  . HYPERLIPIDEMIA 07/30/2009  . Obstructive sleep apnea 07/30/2009  . Essential hypertension 07/30/2009  . GERD 07/30/2009  . Irritable bowel syndrome 07/30/2009    Past Surgical History:  Procedure Laterality Date  . ABDOMINAL HYSTERECTOMY    . GALLBLADDER SURGERY    . KNEE  ARTHROSCOPY    . lymph nodectomy    . NASAL SINUS SURGERY    . SPLENIC ARTERY EMBOLIZATION     due to Aneurysm  . TONSILLECTOMY       OB History   None      Home Medications    Prior to Admission medications   Medication Sig Start Date End Date Taking? Authorizing Provider  acetaminophen (TYLENOL) 500 MG tablet Take 500 mg by mouth every 6 (six) hours as needed (for pain).   Yes [provider]  albuterol (VENTOLIN HFA) 108 (90 BASE) MCG/ACT inhaler Inhale 2 puffs into the lungs every 6 (six) hours as needed for wheezing or shortness of breath.  02/08/12  Yes [provider]  amLODipine (NORVASC) 5 MG tablet Take 5 mg by mouth daily.  08/26/13  Yes [provider]  ciprofloxacin (CIPRO) 500 MG tablet Take 500 mg by mouth 2 (two) times daily. FOR 10 DAYS 03/24/16  Yes [provider]  clonazePAM (KLONOPIN) 0.5 MG tablet Take 0.5 mg by mouth at bedtime. For sleep 08/16/13  Yes [provider]  CRESTOR 20 MG tablet Take 10 mg by mouth daily. 08/07/13  Yes [provider]  EPINEPHrine (EPIPEN 2-PAK) 0.3 mg/0.3 mL IJ SOAJ injection Inject 0.3 mLs as directed as needed (for an anaphylactic reaction).  02/14/13  Yes [provider]  esomeprazole (NEXIUM) 40 MG capsule Take 40 mg by mouth daily.  02/07/12  Yes [provider]  Eyelid Cleansers (AVENOVA) 0.01 %  SOLN Place 1 application into the right eye daily.  08/16/17  Yes [provider]  fluticasone (FLONASE) 50 MCG/ACT nasal spray Place 1 spray into both nostrils daily as needed for allergies or rhinitis.    Yes [provider]  Fluticasone-Salmeterol (ADVAIR DISKUS) 500-50 MCG/DOSE AEPB Inhale 1 puff into the lungs daily as needed (for shortness of breath).  02/08/12  Yes [provider]  furosemide (LASIX) 40 MG tablet Take 40 mg by mouth daily.  08/07/13  Yes [provider]  methocarbamol (ROBAXIN) 500 MG tablet Take 500 mg by mouth 3  (three) times daily as needed for muscle spasms.  06/05/13  Yes [provider]  metroNIDAZOLE (FLAGYL) 500 MG tablet Take 1 tablet (500 mg total) by mouth 3 (three) times daily. Patient taking differently: Take 500 mg by mouth 3 (three) times daily. FOR 10 DAYS 03/24/16  Yes Theodis Blaze, MD  montelukast (SINGULAIR) 10 MG tablet Take 10 mg by mouth at bedtime.  02/21/15  Yes [provider]  Multiple Vitamin (MULTIVITAMIN WITH MINERALS) TABS tablet Take 1 tablet by mouth daily.   Yes [provider]  ondansetron (ZOFRAN) 4 MG tablet Take 1 tablet (4 mg total) by mouth every 6 (six) hours as needed for nausea. 03/24/16  Yes Theodis Blaze, MD  quinapril (ACCUPRIL) 40 MG tablet Take 40 mg by mouth daily.  08/07/13  Yes [provider]  ranitidine (ZANTAC) 150 MG tablet Take 150 mg by mouth at bedtime.   Yes [provider]  RESTASIS 0.05 % ophthalmic emulsion Place 1 drop into both eyes 2 (two) times daily. 08/23/17  Yes [provider]  saccharomyces boulardii (FLORASTOR) 250 MG capsule Take 1 capsule (250 mg total) by mouth 2 (two) times daily. Patient taking differently: Take 250 mg by mouth daily.  03/24/16  Yes Theodis Blaze, MD  ciprofloxacin (CIPRO) 500 MG tablet Take 1 tablet (500 mg total) by mouth 2 (two) times daily. Patient not taking: Reported on 10/13/2017 03/24/16   Theodis Blaze, MD    Family History Family History  Problem Relation Age of Onset  . Diabetes Father   . Hypertension Father     Social History Social History   Tobacco Use  . Smoking status: Never Smoker  . Smokeless tobacco: Never Used  Substance Use Topics  . Alcohol use: No  . Drug use: No     Allergies   Aspirin; Bee venom; Demerol [meperidine]; Ibuprofen; Meperidine hcl; Nsaids; Iohexol; Codeine; Ivp dye [iodinated diagnostic agents]; Mupirocin; Neosporin [neomycin-bacitracin zn-polymyx]; Pollen extract; Sulfonamide derivatives; Tramadol;  Cephalosporins; and Penicillins   Review of Systems Review of Systems  Constitutional: Negative for chills and fever.  HENT: Negative.   Respiratory: Negative.  Negative for shortness of breath.   Cardiovascular: Negative.  Negative for chest pain.  Gastrointestinal: Positive for abdominal pain and nausea. Negative for blood in stool and vomiting.  Genitourinary: Negative.   Musculoskeletal: Negative.   Skin: Negative.   Neurological: Negative.      Physical Exam Updated Vital Signs BP 121/80 (BP Location: Right Arm)   Pulse 80   Temp 98.2 F (36.8 C) (Oral)   Resp 16   Ht 5' 8.5" (1.74 m)   Wt 99.8 kg (220 lb)   SpO2 97%   BMI 32.96 kg/m   Physical Exam  Constitutional: She appears well-developed and well-nourished.  HENT:  Head: Normocephalic.  Neck: Normal range of motion. Neck supple.  Cardiovascular: Normal rate and  regular rhythm.  Pulmonary/Chest: Effort normal and breath sounds normal.  Abdominal: Soft. Bowel sounds are decreased. There is generalized tenderness. There is no rebound and no guarding.  Musculoskeletal: Normal range of motion.  Neurological: She is alert. No cranial nerve deficit.  Skin: Skin is warm and dry. No rash noted.  Psychiatric: She has a normal mood and affect.  Nursing note and vitals reviewed.    ED Treatments / Results  Labs (all labs ordered are listed, but only abnormal results are displayed) Labs Reviewed  COMPREHENSIVE METABOLIC PANEL - Abnormal; Notable for the following components:      Result Value   Potassium 3.4 (*)    Chloride 98 (*)    Glucose, Bld 174 (*)    Creatinine, Ser 1.13 (*)    GFR calc non Af Amer 50 (*)    GFR calc Af Amer 58 (*)    All other components within normal limits  CBC - Abnormal; Notable for the following components:   WBC 14.2 (*)    All other components within normal limits  URINALYSIS, ROUTINE W REFLEX MICROSCOPIC - Abnormal; Notable for the following components:   Color, Urine STRAW  (*)    Specific Gravity, Urine 1.002 (*)    Hgb urine dipstick SMALL (*)    Leukocytes, UA SMALL (*)    Bacteria, UA RARE (*)    All other components within normal limits  LIPASE, BLOOD    EKG None  Radiology No results found.  Procedures Procedures (including critical care time)  Medications Ordered in ED Medications  HYDROmorphone (DILAUDID) injection 0.5 mg (has no administration in time range)  metoCLOPramide (REGLAN) injection 10 mg (has no administration in time range)     Initial Impression / Assessment and Plan / ED Course  I have reviewed the triage vital signs and the nursing notes.  Pertinent labs & imaging results that were available during my care of the patient were reviewed by me and considered in my medical decision making (see chart for details).     Patient presents with lower abdominal pain and nausea with nonbloody diarrhea similar to previous episodes of diverticulitis. No fever. She reports the last time she had diverticulitis is required admission for a week.   Labs show a mild leukocytosis of 14.7. CT scan ordered w/o CM secondary to severe reaction. It confirms findings c/w sigmoid diverticulitis. She has already been started on Cipro and Flagyl. Will provide Zofran for nausea. Encourage PCP follow up closely.  Review of chart shows AKI at previous admission for uncomplicated diverticular infection. She does not have that at this time. No CT evidence of abscess or perforation. No evidence of sepsis. She is felt stable for discharge home.     Final Clinical Impressions(s) / ED Diagnoses   Final diagnoses:  None   1. Sigmoid diverticulitis  ED Discharge Orders    None       Charlann Lange, PA-C 10/14/17 0114    Ward, Delice Bison, DO 10/14/17 [phone]

## 2017-10-14 ENCOUNTER — Emergency Department (HOSPITAL_COMMUNITY): Payer: 59

## 2017-10-14 DIAGNOSIS — R102 Pelvic and perineal pain: Secondary | ICD-10-CM | POA: Diagnosis not present

## 2017-10-14 DIAGNOSIS — K5730 Diverticulosis of large intestine without perforation or abscess without bleeding: Secondary | ICD-10-CM | POA: Diagnosis not present

## 2017-10-14 MED ORDER — HYDROMORPHONE HCL 2 MG/ML IJ SOLN
0.5000 mg | Freq: Once | INTRAMUSCULAR | Status: AC
  Administered 2017-10-14: 0.5 mg via INTRAVENOUS
  Filled 2017-10-14: qty 1

## 2017-10-14 MED ORDER — HYDROCODONE-ACETAMINOPHEN 5-325 MG PO TABS: 1.0000 | ORAL_TABLET | ORAL | 0 refills | Status: DC | PRN

## 2017-10-14 NOTE — Discharge Instructions (Addendum)
Continue Cipro and metronidazole for treatment of diverticulitis. Norco for pain as directed. Zofran for nausea. Follow up with Dr. Dagmar Hait for recheck next week. Return here with any high fever, severe pain, bloody bowel movement or uncontrolled vomiting.

## 2017-10-26 DIAGNOSIS — L821 Other seborrheic keratosis: Secondary | ICD-10-CM | POA: Diagnosis not present

## 2017-10-26 DIAGNOSIS — L820 Inflamed seborrheic keratosis: Secondary | ICD-10-CM | POA: Diagnosis not present

## 2017-10-26 DIAGNOSIS — Z85828 Personal history of other malignant neoplasm of skin: Secondary | ICD-10-CM | POA: Diagnosis not present

## 2017-10-30 DIAGNOSIS — K5732 Diverticulitis of large intestine without perforation or abscess without bleeding: Secondary | ICD-10-CM | POA: Diagnosis not present

## 2017-11-27 ENCOUNTER — Other Ambulatory Visit: Payer: Self-pay | Admitting: Surgery

## 2017-11-27 DIAGNOSIS — K5792 Diverticulitis of intestine, part unspecified, without perforation or abscess without bleeding: Secondary | ICD-10-CM

## 2017-12-07 ENCOUNTER — Ambulatory Visit
Admission: RE | Admit: 2017-12-07 | Discharge: 2017-12-07 | Disposition: A | Discharge status: Home / Self Care | Payer: 59 | Source: Ambulatory Visit | Attending: Surgery | Admitting: Surgery

## 2017-12-07 DIAGNOSIS — K5792 Diverticulitis of intestine, part unspecified, without perforation or abscess without bleeding: Secondary | ICD-10-CM

## 2017-12-07 DIAGNOSIS — K5730 Diverticulosis of large intestine without perforation or abscess without bleeding: Secondary | ICD-10-CM | POA: Diagnosis not present

## 2017-12-26 DIAGNOSIS — R600 Localized edema: Secondary | ICD-10-CM | POA: Diagnosis not present

## 2017-12-26 DIAGNOSIS — E1151 Type 2 diabetes mellitus with diabetic peripheral angiopathy without gangrene: Secondary | ICD-10-CM | POA: Diagnosis not present

## 2017-12-26 DIAGNOSIS — I10 Essential (primary) hypertension: Secondary | ICD-10-CM | POA: Diagnosis not present

## 2018-01-10 DIAGNOSIS — H52211 Irregular astigmatism, right eye: Secondary | ICD-10-CM | POA: Diagnosis not present

## 2018-01-10 DIAGNOSIS — H16223 Keratoconjunctivitis sicca, not specified as Sjogren's, bilateral: Secondary | ICD-10-CM | POA: Diagnosis not present

## 2018-01-10 DIAGNOSIS — H18451 Nodular corneal degeneration, right eye: Secondary | ICD-10-CM | POA: Diagnosis not present

## 2018-02-28 DIAGNOSIS — K5792 Diverticulitis of intestine, part unspecified, without perforation or abscess without bleeding: Secondary | ICD-10-CM | POA: Diagnosis not present

## 2018-03-06 ENCOUNTER — Encounter (HOSPITAL_COMMUNITY): Payer: Self-pay

## 2018-03-06 ENCOUNTER — Emergency Department (HOSPITAL_COMMUNITY): Payer: 59

## 2018-03-06 ENCOUNTER — Emergency Department (HOSPITAL_COMMUNITY)
Admission: EM | Admit: 2018-03-06 | Discharge: 2018-03-06 | Disposition: A | Discharge status: Home / Self Care | Payer: 59 | Attending: Emergency Medicine | Admitting: Emergency Medicine

## 2018-03-06 ENCOUNTER — Other Ambulatory Visit: Payer: Self-pay

## 2018-03-06 DIAGNOSIS — I110 Hypertensive heart disease with heart failure: Secondary | ICD-10-CM | POA: Diagnosis not present

## 2018-03-06 DIAGNOSIS — E119 Type 2 diabetes mellitus without complications: Secondary | ICD-10-CM | POA: Insufficient documentation

## 2018-03-06 DIAGNOSIS — H538 Other visual disturbances: Secondary | ICD-10-CM | POA: Diagnosis not present

## 2018-03-06 DIAGNOSIS — I5032 Chronic diastolic (congestive) heart failure: Secondary | ICD-10-CM | POA: Diagnosis not present

## 2018-03-06 DIAGNOSIS — Z79899 Other long term (current) drug therapy: Secondary | ICD-10-CM | POA: Diagnosis not present

## 2018-03-06 DIAGNOSIS — R079 Chest pain, unspecified: Secondary | ICD-10-CM | POA: Diagnosis not present

## 2018-03-06 DIAGNOSIS — R0789 Other chest pain: Secondary | ICD-10-CM | POA: Insufficient documentation

## 2018-03-06 LAB — CBC
HEMATOCRIT: 44.4 % (ref 36.0–46.0)
Hemoglobin: 14.1 g/dL (ref 12.0–15.0)
MCH: 30.7 pg (ref 26.0–34.0)
MCHC: 31.8 g/dL (ref 30.0–36.0)
MCV: 96.5 fL (ref 78.0–100.0)
Platelets: 361 10*3/uL (ref 150–400)
RBC: 4.6 MIL/uL (ref 3.87–5.11)
RDW: 14 % (ref 11.5–15.5)
WBC: 10.2 10*3/uL (ref 4.0–10.5)

## 2018-03-06 LAB — D-DIMER, QUANTITATIVE (NOT AT ARMC): D DIMER QUANT: 0.31 ug{FEU}/mL (ref 0.00–0.50)

## 2018-03-06 LAB — BASIC METABOLIC PANEL
Anion gap: 14 (ref 5–15)
BUN: 15 mg/dL (ref 8–23)
CHLORIDE: 101 mmol/L (ref 98–111)
CO2: 26 mmol/L (ref 22–32)
Calcium: 9.7 mg/dL (ref 8.9–10.3)
Creatinine, Ser: 1.23 mg/dL — ABNORMAL HIGH (ref 0.44–1.00)
GFR calc Af Amer: 53 mL/min — ABNORMAL LOW (ref >60)
GFR calc non Af Amer: 45 mL/min — ABNORMAL LOW (ref >60)
Glucose, Bld: 174 mg/dL — ABNORMAL HIGH (ref 70–99)
POTASSIUM: 3.5 mmol/L (ref 3.5–5.1)
SODIUM: 141 mmol/L (ref 135–145)

## 2018-03-06 LAB — I-STAT TROPONIN, ED
TROPONIN I, POC: 0 ng/mL (ref 0.00–0.08)
Troponin i, poc: 0 ng/mL (ref 0.00–0.08)

## 2018-03-06 NOTE — ED Provider Notes (Signed)
Patient placed in Quick Look pathway, seen and evaluated   Chief Complaint: Chest pain  HPI:   Flare up of diverticulitis five days ago and was started on doxycycline. A few days later she had a brief episode of blurry vision and attributed it to the doxycycline. No vision changes since that time. Three days ago she started having generalized aching in bilateral arms and legs, again attributing it to the doxycycline. Now her chest is hurting in addition to aching arms and legs. Chest pain started gradually yesterday and radiates to left shoulder, comes and goes without apparent trigger. Is a 4/10 currently. No shortness of breath, nausea or dizziness. No history of prior MI.   ROS: No sob  Physical Exam:   Gen: No distress  Neuro: Awake and Alert  Skin: Warm    Focused Exam: Heart regular rate and rhythm. 2/6 grade systolic murmur heard best in LUSB. Lungs CTA.   Initiation of care has begun. The patient has been counseled on the process, plan, and necessity for staying for the completion/evaluation, and the remainder of the medical screening examination    Bernarda Caffey 03/06/18 [place], [place], DO 03/06/18 1705

## 2018-03-06 NOTE — ED Provider Notes (Signed)
[facility] EMERGENCY DEPARTMENT Provider Note   CSN: [id number] Arrival date & time: 03/06/18  1442     History   Chief Complaint Chief Complaint  Patient presents with  . Blurred Vision  . Chest Pain    HPI [name] is a 62 y.o. female.  Pt presents to the ED today with cp.  Pt said she was treated with doxycycline last week for diverticulitis.  She developed double vision on Sunday, the 22nd.  The pt said it lasted for 10 minutes and has not come back.  She thought it was related to the doxy, so she stopped taking it.  She did call her pcp who changed abx to augmentin.  Today, her chest was hurting in addition to both of her legs.  Pt denies any sx now.     Past Medical History:  Diagnosis Date  . Complication of anesthesia    " I have to be awake to be intubated because I have a mass in my throut "  . Diabetes mellitus   . Difficult intubation    mass in throut  . Diverticulitis   . Hyperlipidemia   . Hypertension   . Migraines 2013    Patient Active Problem List   Diagnosis Date Noted  . AKI (acute kidney injury) ([place]) 03/20/2016  . DM type 2, goal HbA1c < 7% (HCC) 03/20/2016  . Chronic diastolic CHF (congestive heart failure) ([place]) 03/20/2016  . Sigmoid diverticulitis 03/20/2016  . Diverticulitis 03/20/2016  . Leukocytosis 03/19/2016  . Chest pain 08/31/2013  . CHEST PAIN-UNSPECIFIED 08/04/2009  . THYROID NODULE, LEFT 07/30/2009  . Diabetes mellitus ([place]) 07/30/2009  . HYPERLIPIDEMIA 07/30/2009  . Obstructive sleep apnea 07/30/2009  . Essential hypertension 07/30/2009  . GERD 07/30/2009  . Irritable bowel syndrome 07/30/2009    Past Surgical History:  Procedure Laterality Date  . ABDOMINAL HYSTERECTOMY    . GALLBLADDER SURGERY    . KNEE ARTHROSCOPY    . lymph nodectomy    . NASAL SINUS SURGERY    . SPLENIC ARTERY EMBOLIZATION     due to Aneurysm  . TONSILLECTOMY       OB History   None      Home Medications     Prior to Admission medications   Medication Sig Start Date End Date Taking? Authorizing Provider  acetaminophen (TYLENOL) 500 MG tablet Take 500 mg by mouth every 6 (six) hours as needed (for pain).    [provider]  albuterol (VENTOLIN HFA) 108 (90 BASE) MCG/ACT inhaler Inhale 2 puffs into the lungs every 6 (six) hours as needed for wheezing or shortness of breath.  02/08/12   [provider]  amLODipine (NORVASC) 5 MG tablet Take 5 mg by mouth daily.  08/26/13   [provider]  ciprofloxacin (CIPRO) 500 MG tablet Take 1 tablet (500 mg total) by mouth 2 (two) times daily. Patient not taking: Reported on 10/13/2017 03/24/16   Theodis Blaze, MD  ciprofloxacin (CIPRO) 500 MG tablet Take 500 mg by mouth 2 (two) times daily. FOR 10 DAYS 03/24/16   [provider]  clonazePAM (KLONOPIN) 0.5 MG tablet Take 0.5 mg by mouth at bedtime. For sleep 08/16/13   [provider]  CRESTOR 20 MG tablet Take 10 mg by mouth daily. 08/07/13   [provider]  EPINEPHrine (EPIPEN 2-PAK) 0.3 mg/0.3 mL IJ SOAJ injection Inject 0.3 mLs as directed as needed (for an anaphylactic reaction).  02/14/13   [provider]  esomeprazole (NEXIUM) 40 MG capsule Take 40 mg by mouth daily.  02/07/12   [provider]  Eyelid Cleansers (AVENOVA) 0.01 % SOLN Place 1 application into the right eye daily.  08/16/17   [provider]  fluticasone (FLONASE) 50 MCG/ACT nasal spray Place 1 spray into both nostrils daily as needed for allergies or rhinitis.     [provider]  Fluticasone-Salmeterol (ADVAIR DISKUS) 500-50 MCG/DOSE AEPB Inhale 1 puff into the lungs daily as needed (for shortness of breath).  02/08/12   [provider]  furosemide (LASIX) 40 MG tablet Take 40 mg by mouth daily.  08/07/13   [provider]  HYDROcodone-acetaminophen (NORCO/VICODIN) 5-325 MG tablet Take 1-2 tablets by mouth every 4 (four) hours as needed. 10/14/17    Charlann Lange, PA-C  methocarbamol (ROBAXIN) 500 MG tablet Take 500 mg by mouth 3 (three) times daily as needed for muscle spasms.  06/05/13   [provider]  metroNIDAZOLE (FLAGYL) 500 MG tablet Take 1 tablet (500 mg total) by mouth 3 (three) times daily. Patient taking differently: Take 500 mg by mouth 3 (three) times daily. FOR 10 DAYS 03/24/16   Theodis Blaze, MD  montelukast (SINGULAIR) 10 MG tablet Take 10 mg by mouth at bedtime.  02/21/15   [provider]  Multiple Vitamin (MULTIVITAMIN WITH MINERALS) TABS tablet Take 1 tablet by mouth daily.    [provider]  ondansetron (ZOFRAN) 4 MG tablet Take 1 tablet (4 mg total) by mouth every 6 (six) hours as needed for nausea. 03/24/16   Theodis Blaze, MD  quinapril (ACCUPRIL) 40 MG tablet Take 40 mg by mouth daily.  08/07/13   [provider]  ranitidine (ZANTAC) 150 MG tablet Take 150 mg by mouth at bedtime.    [provider]  RESTASIS 0.05 % ophthalmic emulsion Place 1 drop into both eyes 2 (two) times daily. 08/23/17   [provider]  saccharomyces boulardii (FLORASTOR) 250 MG capsule Take 1 capsule (250 mg total) by mouth 2 (two) times daily. Patient taking differently: Take 250 mg by mouth daily.  03/24/16   Theodis Blaze, MD    Family History Family History  Problem Relation Age of Onset  . Diabetes Father   . Hypertension Father     Social History Social History   Tobacco Use  . Smoking status: Never Smoker  . Smokeless tobacco: Never Used  Substance Use Topics  . Alcohol use: No  . Drug use: No     Allergies   Aspirin; Bee venom; Demerol [meperidine]; Ibuprofen; Meperidine hcl; Nsaids; Iohexol; Codeine; Ivp dye [iodinated diagnostic agents]; Mupirocin; Neosporin [neomycin-bacitracin zn-polymyx]; Pollen extract; Sulfonamide derivatives; Tramadol; Cephalosporins; and Penicillins   Review of Systems Review of Systems  Cardiovascular: Positive for chest pain.   All other systems reviewed and are negative.    Physical Exam Updated Vital Signs BP 130/85   Pulse 85   Temp 97.7 F (36.5 C) (Oral)   Resp 15   SpO2 (!) 89%   Physical Exam  Constitutional: She is oriented to person, place, and time. She appears well-developed and well-nourished.  HENT:  Head: Normocephalic and atraumatic.  Eyes: Pupils are equal, round, and reactive to light. EOM are normal.  Neck: Normal range of motion. Neck supple.  Cardiovascular: Normal rate, regular rhythm, intact distal pulses and normal pulses.  Pulmonary/Chest: Effort normal and breath sounds normal.  Abdominal: Soft. Bowel sounds are normal.  Musculoskeletal: Normal range of  motion.       Right lower leg: Normal.       Left lower leg: Normal.  Neurological: She is alert and oriented to person, place, and time.  Skin: Skin is warm and dry. Capillary refill takes less than 2 seconds.  Psychiatric: She has a normal mood and affect. Her behavior is normal.  Nursing note and vitals reviewed.    ED Treatments / Results  Labs (all labs ordered are listed, but only abnormal results are displayed) Labs Reviewed  BASIC METABOLIC PANEL - Abnormal; Notable for the following components:      Result Value   Glucose, Bld 174 (*)    Creatinine, Ser 1.23 (*)    GFR calc non Af Amer 45 (*)    GFR calc Af Amer 53 (*)    All other components within normal limits  CBC  D-DIMER, QUANTITATIVE (NOT AT [facility])  I-STAT TROPONIN, ED  I-STAT TROPONIN, ED  I-STAT TROPONIN, ED    EKG EKG Interpretation  Date/Time:  Tuesday March 06 2018 14:51:09 EDT Ventricular Rate:  93 PR Interval:  208 QRS Duration: 80 QT Interval:  370 QTC Calculation: 460 R Axis:   43 Text Interpretation:  Normal sinus rhythm Low voltage QRS Cannot rule out Anterior infarct , age undetermined Abnormal ECG No significant change since last tracing Confirmed by Isla Pence [id number]) on 03/06/2018 9:17:12 PM   Radiology Dg Chest 2  View  Result Date: 03/06/2018 CLINICAL DATA:  Chest pain. EXAM: CHEST - 2 VIEW COMPARISON:  Radiographs of August 30, 2013. FINDINGS: The heart size and mediastinal contours are within normal limits. Both lungs are clear. No pneumothorax or pleural effusion is noted. The visualized skeletal structures are unremarkable. IMPRESSION: No active cardiopulmonary disease. Electronically Signed   By: Marijo Conception, M.D.   On: 03/06/2018 15:50   Ct Head Wo Contrast  Result Date: 03/06/2018 CLINICAL DATA:  Episodes of blurry vision for several days. No reported injury. EXAM: CT HEAD WITHOUT CONTRAST TECHNIQUE: Contiguous axial images were obtained from the base of the skull through the vertex without intravenous contrast. COMPARISON:  08/27/2010 head CT. FINDINGS: Brain: No evidence of parenchymal hemorrhage or extra-axial fluid collection. No mass lesion, mass effect, or midline shift. No CT evidence of acute infarction. Cerebral volume is age appropriate. No ventriculomegaly. Vascular: No acute abnormality. Skull: No evidence of calvarial fracture. Sinuses/Orbits: The visualized paranasal sinuses are essentially clear. Other:  The mastoid air cells are unopacified. IMPRESSION: Negative head CT.  No evidence of acute intracranial abnormality. Electronically Signed   By: Ilona Sorrel M.D.   On: 03/06/2018 23:11    Procedures Procedures (including critical care time)  Medications Ordered in ED Medications - No data to display   Initial Impression / Assessment and Plan / ED Course  I have reviewed the triage vital signs and the nursing notes.  Pertinent labs & imaging results that were available during my care of the patient were reviewed by me and considered in my medical decision making (see chart for details).    Pt is feeling better.  2 negative troponins several hours apart.  Neg ddimer.  Ct head ok.  Labs ok.  Pt is stable for d/c. Return if worse.  Final Clinical Impressions(s) / ED Diagnoses    Final diagnoses:  Atypical chest pain    ED Discharge Orders    None       Isla Pence, MD 03/06/18 2334

## 2018-03-06 NOTE — ED Notes (Signed)
Pt remains in waiting room. Updated on wait for treatment room. 

## 2018-03-06 NOTE — ED Notes (Signed)
ED Provider at bedside. 

## 2018-03-06 NOTE — ED Notes (Signed)
Paramedic student attempted IV x 2.  This RN attempted IV x 2 without success.  Will request Iv Team

## 2018-03-06 NOTE — ED Triage Notes (Signed)
Pt states she was taking doxycycline last week for diverticulitis flare up. She states she had 2 episodes of blurred vision over the weekend. She reports arms and legs aching. Now she has chest pain radiating into her left shoulder.

## 2018-03-06 NOTE — ED Notes (Signed)
Reviewed d/c instructions with pt, who verbalized understanding and had no outstanding questions. Pt departed in NAD, refused use of wheelchair.   

## 2018-03-09 DIAGNOSIS — R1310 Dysphagia, unspecified: Secondary | ICD-10-CM | POA: Diagnosis not present

## 2018-03-09 DIAGNOSIS — R1319 Other dysphagia: Secondary | ICD-10-CM | POA: Diagnosis not present

## 2018-03-09 DIAGNOSIS — Z01818 Encounter for other preprocedural examination: Secondary | ICD-10-CM | POA: Diagnosis not present

## 2018-03-09 DIAGNOSIS — R4789 Other speech disturbances: Secondary | ICD-10-CM | POA: Diagnosis not present

## 2018-03-09 DIAGNOSIS — J351 Hypertrophy of tonsils: Secondary | ICD-10-CM | POA: Diagnosis not present

## 2018-03-09 DIAGNOSIS — J039 Acute tonsillitis, unspecified: Secondary | ICD-10-CM | POA: Diagnosis not present

## 2018-03-21 DIAGNOSIS — J45909 Unspecified asthma, uncomplicated: Secondary | ICD-10-CM | POA: Diagnosis not present

## 2018-03-21 DIAGNOSIS — J019 Acute sinusitis, unspecified: Secondary | ICD-10-CM | POA: Diagnosis not present

## 2018-03-21 DIAGNOSIS — R05 Cough: Secondary | ICD-10-CM | POA: Diagnosis not present

## 2018-03-21 DIAGNOSIS — R591 Generalized enlarged lymph nodes: Secondary | ICD-10-CM | POA: Diagnosis not present

## 2018-03-29 DIAGNOSIS — Z85828 Personal history of other malignant neoplasm of skin: Secondary | ICD-10-CM | POA: Diagnosis not present

## 2018-03-29 DIAGNOSIS — L821 Other seborrheic keratosis: Secondary | ICD-10-CM | POA: Diagnosis not present

## 2018-03-29 DIAGNOSIS — D2261 Melanocytic nevi of right upper limb, including shoulder: Secondary | ICD-10-CM | POA: Diagnosis not present

## 2018-03-29 DIAGNOSIS — L570 Actinic keratosis: Secondary | ICD-10-CM | POA: Diagnosis not present

## 2018-04-17 DIAGNOSIS — Z0000 Encounter for general adult medical examination without abnormal findings: Secondary | ICD-10-CM | POA: Diagnosis not present

## 2018-04-17 DIAGNOSIS — E1151 Type 2 diabetes mellitus with diabetic peripheral angiopathy without gangrene: Secondary | ICD-10-CM | POA: Diagnosis not present

## 2018-04-17 DIAGNOSIS — M3500 Sicca syndrome, unspecified: Secondary | ICD-10-CM | POA: Diagnosis not present

## 2018-04-17 DIAGNOSIS — R82998 Other abnormal findings in urine: Secondary | ICD-10-CM | POA: Diagnosis not present

## 2018-04-20 DIAGNOSIS — I10 Essential (primary) hypertension: Secondary | ICD-10-CM | POA: Diagnosis not present

## 2018-04-20 DIAGNOSIS — E1151 Type 2 diabetes mellitus with diabetic peripheral angiopathy without gangrene: Secondary | ICD-10-CM | POA: Diagnosis not present

## 2018-04-20 DIAGNOSIS — Z1389 Encounter for screening for other disorder: Secondary | ICD-10-CM | POA: Diagnosis not present

## 2018-04-20 DIAGNOSIS — E782 Mixed hyperlipidemia: Secondary | ICD-10-CM | POA: Diagnosis not present

## 2018-04-20 DIAGNOSIS — Z0000 Encounter for general adult medical examination without abnormal findings: Secondary | ICD-10-CM | POA: Diagnosis not present

## 2018-04-27 DIAGNOSIS — Z1212 Encounter for screening for malignant neoplasm of rectum: Secondary | ICD-10-CM | POA: Diagnosis not present

## 2018-05-03 DIAGNOSIS — M3500 Sicca syndrome, unspecified: Secondary | ICD-10-CM | POA: Insufficient documentation

## 2018-05-14 DIAGNOSIS — G43709 Chronic migraine without aura, not intractable, without status migrainosus: Secondary | ICD-10-CM | POA: Diagnosis not present

## 2018-05-14 DIAGNOSIS — G4752 REM sleep behavior disorder: Secondary | ICD-10-CM | POA: Diagnosis not present

## 2018-05-16 DIAGNOSIS — H16223 Keratoconjunctivitis sicca, not specified as Sjogren's, bilateral: Secondary | ICD-10-CM | POA: Diagnosis not present

## 2018-05-16 DIAGNOSIS — H18451 Nodular corneal degeneration, right eye: Secondary | ICD-10-CM | POA: Diagnosis not present

## 2018-05-16 DIAGNOSIS — H2513 Age-related nuclear cataract, bilateral: Secondary | ICD-10-CM | POA: Diagnosis not present

## 2018-05-21 DIAGNOSIS — I8310 Varicose veins of unspecified lower extremity with inflammation: Secondary | ICD-10-CM | POA: Diagnosis not present

## 2018-05-21 DIAGNOSIS — I10 Essential (primary) hypertension: Secondary | ICD-10-CM | POA: Diagnosis not present

## 2018-05-21 DIAGNOSIS — E1151 Type 2 diabetes mellitus with diabetic peripheral angiopathy without gangrene: Secondary | ICD-10-CM | POA: Diagnosis not present

## 2018-05-22 ENCOUNTER — Other Ambulatory Visit: Payer: Self-pay | Admitting: Internal Medicine

## 2018-05-22 DIAGNOSIS — Z01419 Encounter for gynecological examination (general) (routine) without abnormal findings: Secondary | ICD-10-CM | POA: Diagnosis not present

## 2018-05-22 DIAGNOSIS — Z1231 Encounter for screening mammogram for malignant neoplasm of breast: Secondary | ICD-10-CM

## 2018-05-23 ENCOUNTER — Other Ambulatory Visit: Payer: Self-pay | Admitting: Obstetrics and Gynecology

## 2018-05-23 DIAGNOSIS — R5381 Other malaise: Secondary | ICD-10-CM

## 2018-06-14 DIAGNOSIS — I110 Hypertensive heart disease with heart failure: Secondary | ICD-10-CM | POA: Diagnosis not present

## 2018-06-14 DIAGNOSIS — J3501 Chronic tonsillitis: Secondary | ICD-10-CM | POA: Diagnosis not present

## 2018-06-14 DIAGNOSIS — Z9189 Other specified personal risk factors, not elsewhere classified: Secondary | ICD-10-CM | POA: Diagnosis not present

## 2018-06-14 DIAGNOSIS — E119 Type 2 diabetes mellitus without complications: Secondary | ICD-10-CM | POA: Diagnosis not present

## 2018-06-14 DIAGNOSIS — J039 Acute tonsillitis, unspecified: Secondary | ICD-10-CM | POA: Diagnosis not present

## 2018-06-14 DIAGNOSIS — J351 Hypertrophy of tonsils: Secondary | ICD-10-CM | POA: Diagnosis not present

## 2018-06-14 DIAGNOSIS — I5032 Chronic diastolic (congestive) heart failure: Secondary | ICD-10-CM | POA: Diagnosis not present

## 2018-07-05 ENCOUNTER — Ambulatory Visit
Admission: RE | Admit: 2018-07-05 | Discharge: 2018-07-05 | Disposition: A | Discharge status: Home / Self Care | Payer: 59 | Source: Ambulatory Visit | Attending: Internal Medicine | Admitting: Internal Medicine

## 2018-07-05 DIAGNOSIS — Z1231 Encounter for screening mammogram for malignant neoplasm of breast: Secondary | ICD-10-CM

## 2018-07-25 ENCOUNTER — Other Ambulatory Visit: Payer: Self-pay | Admitting: Obstetrics and Gynecology

## 2018-07-25 ENCOUNTER — Other Ambulatory Visit: Payer: Self-pay

## 2018-07-25 DIAGNOSIS — E2839 Other primary ovarian failure: Secondary | ICD-10-CM

## 2018-07-26 ENCOUNTER — Ambulatory Visit
Admission: RE | Admit: 2018-07-26 | Discharge: 2018-07-26 | Disposition: A | Discharge status: Home / Self Care | Payer: 59 | Source: Ambulatory Visit | Attending: Obstetrics and Gynecology | Admitting: Obstetrics and Gynecology

## 2018-07-26 DIAGNOSIS — E2839 Other primary ovarian failure: Secondary | ICD-10-CM

## 2018-11-21 NOTE — Progress Notes (Signed)
Cardiology Office Note   Date:  11/22/2018   ID:  [name], DOB [birth year], MRN [id number]  PCP:  [name], MD  Cardiologist:   No primary care provider on file. Referring:  [name], MD  No chief complaint on file.     History of Present Illness: [name] is a 63 y.o. female who is referred by [name], MD for evaluation of palpitations and arm pain.     I did see her in March 2015 when she was briefly hospitalized  For evaluation of chest pain. This was atypical. Follow-up stress testing was unremarkable for any evidence ischemia.   I saw her last in 2016 preop prior to tonsillectomy.  She had the tonsillectomy finally in January of this year.  He had to be done in [place] because it was a complicated issue.  She said she did well with this.  I do see that she also had an echocardiogram in 2017 and I reviewed this and there were no significant abnormalities.  She is referred back.  Her daughter who is in her 27s just had triple bypass.  Her daughter has had longstanding diabetes.  The patient reports that she has had chronic leg swelling.  She is been taking Lasix for this for years.  She has been feeling her heart flopping.  It does this sporadically.  She cannot bring it on.  She is not getting any presyncope or syncope.  She is not having any sustained tachyarrhythmias by her report.  However, she is been getting left arm discomfort.  This is been happening for a few weeks.  She gets some chest discomfort as well.  This might last for 4 to 5 hours.  Started a few weeks ago.  It seems to be sporadic.  She cannot bring it on with activities.  She might notice the arm discomfort afterwards.  It is somewhat dull.  She does not describe associated nausea vomiting or diaphoresis.  Seems to be mild to moderate in intensity.  Cannot with movement or respiration.  She is getting short of breath with activities such as walking 50 yards to her mailbox.  Is  not describing resting shortness of breath, PND or orthopnea.  She is had no cough fevers or chills.  Past Medical History:  Diagnosis Date  . Complication of anesthesia    " I have to be awake to be intubated because I have a mass in my throut "  . Diabetes mellitus   . Difficult intubation    mass in throut  . Diverticulitis   . Hyperlipidemia   . Hypertension   . Migraines 2013  . Sleep apnea    CPAP    Past Surgical History:  Procedure Laterality Date  . ABDOMINAL HYSTERECTOMY    . GALLBLADDER SURGERY    . KNEE ARTHROSCOPY    . lymph nodectomy    . NASAL SINUS SURGERY    . SPLENIC ARTERY EMBOLIZATION     due to Aneurysm  . TONSILLECTOMY       Current Outpatient Medications  Medication Sig Dispense Refill  . acetaminophen (TYLENOL) 500 MG tablet Take 500 mg by mouth every 6 (six) hours as needed (for pain).    Marland Kitchen albuterol (VENTOLIN HFA) 108 (90 BASE) MCG/ACT inhaler Inhale 2 puffs into the lungs every 6 (six) hours as needed for wheezing or shortness of breath.     . clonazePAM (KLONOPIN) 0.5 MG tablet Take 0.5 mg by  mouth at bedtime. For sleep    . CRESTOR 20 MG tablet Take 10 mg by mouth daily.    Marland Kitchen diltiazem (DILT-XR) 180 MG 24 hr capsule DILT-XR 180 mg capsule, extended release  TAKE 1 CAPSULE BY MOUTH EVERY DAY    . EPINEPHrine (EPIPEN 2-PAK) 0.3 mg/0.3 mL IJ SOAJ injection Inject 0.3 mLs as directed as needed (for an anaphylactic reaction).     Marland Kitchen esomeprazole (NEXIUM) 40 MG capsule Take 40 mg by mouth daily.     . Eyelid Cleansers (AVENOVA) 0.01 % SOLN Place 1 application into the right eye daily.     . famotidine (PEPCID) 40 MG tablet famotidine 40 mg tablet  TAKE 1 TABLET BY MOUTH EVERYDAY AT BEDTIME    . fluticasone (FLONASE) 50 MCG/ACT nasal spray Place 1 spray into both nostrils daily as needed for allergies or rhinitis.     . Fluticasone-Salmeterol (ADVAIR DISKUS) 500-50 MCG/DOSE AEPB Inhale 1 puff into the lungs daily as needed (for shortness of breath).      . furosemide (LASIX) 40 MG tablet Take 40 mg by mouth daily.     . methocarbamol (ROBAXIN) 500 MG tablet Take 500 mg by mouth 3 (three) times daily as needed for muscle spasms.     . montelukast (SINGULAIR) 10 MG tablet Take 10 mg by mouth at bedtime.     . Multiple Vitamin (MULTIVITAMIN WITH MINERALS) TABS tablet Take 1 tablet by mouth daily.    . ondansetron (ZOFRAN) 4 MG tablet Take 1 tablet (4 mg total) by mouth every 6 (six) hours as needed for nausea. 20 tablet 0  . quinapril (ACCUPRIL) 40 MG tablet Take 40 mg by mouth daily.     . RESTASIS 0.05 % ophthalmic emulsion Place 1 drop into both eyes 2 (two) times daily.  3  . saccharomyces boulardii (FLORASTOR) 250 MG capsule Take 1 capsule (250 mg total) by mouth 2 (two) times daily. (Patient taking differently: Take 250 mg by mouth daily. ) 60 capsule 0  . diphenhydrAMINE (BENADRYL) 50 MG tablet Take 1 tablet (50 mg total) by mouth once for 1 dose. 1 tablet 0  . metoprolol tartrate (LOPRESSOR) 100 MG tablet Take 1 tablet (100 mg total) by mouth once for 1 dose. 1 tablet 0  . predniSONE (DELTASONE) 50 MG tablet Take 1 tablet 13 hour prior to procedure, take another 7 hour prior to procedure and last tablet 1 hour prior to procedure 3 tablet 0   No current facility-administered medications for this visit.     Allergies:   Aspirin, Bee venom, Demerol [meperidine], Ibuprofen, Meperidine hcl, Nsaids, Iohexol, Clindamycin/lincomycin, Codeine, Ivp dye [iodinated diagnostic agents], Mupirocin, Neosporin [neomycin-bacitracin zn-polymyx], Pollen extract, Sulfonamide derivatives, Tramadol, Cephalosporins, and Penicillins    Social History:  The patient  reports that she has never smoked. She has never used smokeless tobacco. She reports that she does not drink alcohol or use drugs.   Family History:  The patient's family history includes CAD (age of onset: 10) in her daughter; Diabetes in her father; Hypertension in her father.    ROS:  Please see  the history of present illness.   Otherwise, review of systems are positive for none.   All other systems are reviewed and negative.    PHYSICAL EXAM: VS:  BP 111/71   Pulse 79   Temp 97.8 F (36.6 C)   Ht 5\' 8"  (1.727 m)   Wt 226 lb 3.2 oz (102.6 kg)   SpO2 96%  BMI 34.39 kg/m  , BMI Body mass index is 34.39 kg/m. GENERAL:  Well appearing HEENT:  Pupils equal round and reactive, fundi not visualized, oral mucosa unremarkable NECK:  No jugular venous distention, waveform within normal limits, carotid upstroke brisk and symmetric, no bruits, no thyromegaly LYMPHATICS:  No cervical, inguinal adenopathy LUNGS:  Clear to auscultation bilaterally BACK:  No CVA tenderness CHEST:  Unremarkable HEART:  PMI not displaced or sustained,S1 and S2 within normal limits, no S3, no S4, no clicks, no rubs, no murmurs ABD:  Flat, positive bowel sounds normal in frequency in pitch, no bruits, no rebound, no guarding, no midline pulsatile mass, no hepatomegaly, no splenomegaly EXT:  2 plus pulses throughout, moderate leg edema, no cyanosis no clubbing SKIN:  No rashes no nodules NEURO:  Cranial nerves II through XII grossly intact, motor grossly intact throughout PSYCH:  Cognitively intact, oriented to person place and time    EKG:  EKG is ordered today. The ekg ordered today demonstrates sinus rhythm, rate 79, axis within normal limits, intervals within normal limits, no acute ST-T wave changes.   Recent Labs: 03/06/2018: BUN 15; Creatinine, Ser 1.23; Hemoglobin 14.1; Platelets 361; Potassium 3.5; Sodium 141    Lipid Panel    Component Value Date/Time   CHOL  07/20/2009 0343    170        ATP III CLASSIFICATION:  <200     mg/dL   Desirable  200-239  mg/dL   Borderline High  >=240    mg/dL   High          TRIG 170 (H) 07/20/2009 0343   HDL 43 07/20/2009 0343   CHOLHDL 4.0 07/20/2009 0343   VLDL 34 07/20/2009 0343   LDLCALC  07/20/2009 0343    93        Total Cholesterol/HDL:CHD Risk  Coronary Heart Disease Risk Table                     Men   Women  1/2 Average Risk   3.4   3.3  Average Risk       5.0   4.4  2 X Average Risk   9.6   7.1  3 X Average Risk  23.4   11.0        Use the calculated Patient Ratio above and the CHD Risk Table to determine the patient's CHD Risk.        ATP III CLASSIFICATION (LDL):  <100     mg/dL   Optimal  100-129  mg/dL   Near or Above                    Optimal  130-159  mg/dL   Borderline  160-189  mg/dL   High  >190     mg/dL   Very High      Wt Readings from Last 3 Encounters:  11/22/18 226 lb 3.2 oz (102.6 kg)  10/13/17 220 lb (99.8 kg)  03/24/16 222 lb 14.2 oz (101.1 kg)      Other studies Reviewed: Additional studies/ records that were reviewed today include: Labs. Review of the above records demonstrates:  Please see elsewhere in the note.     ASSESSMENT AND PLAN:  PALPITATIONS: I will investigate this further possibly with a monitor but I am going to evaluate the chest discomfort first as above.  HTN: Blood pressures controlled.  She will continue the meds as listed.  DOE: On that he  start with a BNP level.  She had a normal echo a few years ago.  I will consider repeating this based on results of the testing below and is a BNP.  CHEST PAIN: She does have cardiovascular risk factors and in particular the family history now.  I would walk her on a treadmill but we are not currently doing these.  I think the pretest probability of coronary disease is at least moderate she needs to be evaluated with a CT.  Of note she does have a dye allergy but she has tolerated contrast with prophylaxis before.  We will employ our usual precautions.  She will be premedicated.   Current medicines are reviewed at length with the patient today.  The patient does not have concerns regarding medicines.  The following changes have been made:  no change  Labs/ tests ordered today include:   Orders Placed This Encounter  Procedures   . CT CORONARY MORPH W/CTA COR W/SCORE W/CA W/CM &/OR WO/CM  . CT CORONARY FRACTIONAL FLOW RESERVE DATA PREP  . CT CORONARY FRACTIONAL FLOW RESERVE FLUID ANALYSIS  . Basic Metabolic Panel (BMET)  . B Nat Peptide  . EKG 12-Lead    Disposition:   FU with in one month in the office.     Signed, Minus Breeding, MD  11/22/2018 5:02 PM    [facility]

## 2018-11-22 ENCOUNTER — Ambulatory Visit (INDEPENDENT_AMBULATORY_CARE_PROVIDER_SITE_OTHER): Payer: Medicare Other | Admitting: Cardiology

## 2018-11-22 ENCOUNTER — Other Ambulatory Visit: Payer: Self-pay

## 2018-11-22 ENCOUNTER — Encounter: Payer: Self-pay | Admitting: Cardiology

## 2018-11-22 VITALS — BP 111/71 | HR 79 | Temp 97.8°F | Ht 68.0 in | Wt 226.2 lb

## 2018-11-22 DIAGNOSIS — R079 Chest pain, unspecified: Secondary | ICD-10-CM

## 2018-11-22 DIAGNOSIS — R0602 Shortness of breath: Secondary | ICD-10-CM

## 2018-11-22 MED ORDER — PREDNISONE 50 MG PO TABS: ORAL_TABLET | ORAL | 0 refills | Status: DC

## 2018-11-22 MED ORDER — METOPROLOL TARTRATE 100 MG PO TABS: 100.0000 mg | ORAL_TABLET | Freq: Once | ORAL | 0 refills | Status: DC

## 2018-11-22 MED ORDER — DIPHENHYDRAMINE HCL 50 MG PO TABS: 50.0000 mg | ORAL_TABLET | Freq: Once | ORAL | 0 refills | Status: DC

## 2018-11-22 NOTE — Patient Instructions (Addendum)
Medication Instructions:  Continue current medications  If you need a refill on your cardiac medications before your next appointment, please call your pharmacy.  Labwork: BMP and BNP today HERE IN OUR OFFICE AT [facility]  You will NOT need to fast   Take the provided lab slips with you to the lab for your blood draw.   When you have your labs (blood work) drawn today and your tests are completely normal, you will receive your results only by MyChart Message (if you have MyChart) -OR-  A paper copy in the mail.  If you have any lab test that is abnormal or we need to change your treatment, we will call you to review these results.  Testing/Procedures: Non-Cardiac CT Angiography (CTA), is a special type of CT scan that uses a computer to produce multi-dimensional views of major blood vessels throughout the body. In CT angiography, a contrast material is injected through an IV to help visualize the blood vessels   Special Instructions: Please arrive at the [facility] main entrance of [facility] at                AM (30-45 minutes prior to test start time)  [facility] [place], [place] [address] [phone]  Proceed to the [facility] Radiology Department (First Floor).  Please follow these instructions carefully (unless otherwise directed):  Hold all erectile dysfunction medications at least 48 hours prior to test.  On the Night Before the Test: . Be sure to Drink plenty of water. . Do not consume any caffeinated/decaffeinated beverages or chocolate 12 hours prior to your test. . Do not take any antihistamines 12 hours prior to your test. . If you take Metformin do not take 24 hours prior to test. . If the patient has contrast allergy: ? Patient will need a prescription for Prednisone and very clear instructions (as follows): 1. Prednisone 50 mg - take 13 hours prior to test 2. Take another Prednisone 50 mg 7 hours prior to test 3. Take another  Prednisone 50 mg 1 hour prior to test 4. Take Benadryl 50 mg 1 hour prior to test . Patient must complete all four doses of above prophylactic medications. . Patient will need a ride after test due to Benadryl.  On the Day of the Test: . Drink plenty of water. Do not drink any water within one hour of the test. . Do not eat any food 4 hours prior to the test. . You may take your regular medications prior to the test.  . Take metoprolol (Lopressor) two hours prior to test. . HOLD Furosemide/Hydrochlorothiazide morning of the test.   After the Test: . Drink plenty of water. . After receiving IV contrast, you may experience a mild flushed feeling. This is normal. . On occasion, you may experience a mild rash up to 24 hours after the test. This is not dangerous. If this occurs, you can take Benadryl 25 mg and increase your fluid intake. . If you experience trouble breathing, this can be serious. If it is severe call 911 IMMEDIATELY. If it is mild, please call our office. . If you take any of these medications: Glipizide/Metformin, Avandament, Glucavance, please do not take 48 hours after completing test.   Follow-Up: .  Your physician recommends that you schedule a follow-up appointment in: 1 Month   At [facility], you and your health needs are our priority.  As part of our continuing mission to provide  you with exceptional heart care, we have created designated Provider Care Teams.  These Care Teams include your primary Cardiologist (physician) and Advanced Practice Providers (APPs -  Physician Assistants and Nurse Practitioners) who all work together to provide you with the care you need, when you need it.  Thank you for choosing [facility] at [facility]!!

## 2018-11-23 LAB — BASIC METABOLIC PANEL
BUN/Creatinine Ratio: 8 — ABNORMAL LOW (ref 12–28)
BUN: 10 mg/dL (ref 8–27)
CO2: 25 mmol/L (ref 20–29)
Calcium: 10 mg/dL (ref 8.7–10.3)
Chloride: 100 mmol/L (ref 96–106)
Creatinine, Ser: 1.18 mg/dL — ABNORMAL HIGH (ref 0.57–1.00)
GFR calc Af Amer: 56 mL/min/{1.73_m2} — ABNORMAL LOW (ref >59)
GFR calc non Af Amer: 49 mL/min/{1.73_m2} — ABNORMAL LOW (ref >59)
Glucose: 113 mg/dL — ABNORMAL HIGH (ref 65–99)
Potassium: 4.5 mmol/L (ref 3.5–5.2)
Sodium: 142 mmol/L (ref 134–144)

## 2018-11-23 LAB — BRAIN NATRIURETIC PEPTIDE: BNP: 16.6 pg/mL (ref 0.0–100.0)

## 2018-12-11 ENCOUNTER — Telehealth (HOSPITAL_COMMUNITY): Payer: Self-pay | Admitting: Emergency Medicine

## 2018-12-11 NOTE — Telephone Encounter (Signed)
Reaching out to patient to offer assistance regarding upcoming cardiac imaging study; pt verbalizes understanding of appt date/time, parking situation and where to check in, pre-test NPO status and medications ordered, and verified current allergies; name and call back number provided for further questions should they arise Marchia Bond RN [facility] [phone] office [phone] cell  Pt denies covid symptoms, verbalized understanding of visitor policy.  Pt will have ride home after test d/t benadryl admin

## 2018-12-12 ENCOUNTER — Ambulatory Visit (HOSPITAL_COMMUNITY): Payer: Medicare Other

## 2018-12-12 ENCOUNTER — Other Ambulatory Visit: Payer: Self-pay

## 2018-12-12 ENCOUNTER — Ambulatory Visit (HOSPITAL_COMMUNITY)
Admission: RE | Admit: 2018-12-12 | Discharge: 2018-12-12 | Disposition: A | Discharge status: Home / Self Care | Payer: Medicare Other | Source: Ambulatory Visit | Attending: Cardiology | Admitting: Cardiology

## 2018-12-12 ENCOUNTER — Encounter: Payer: Medicare Other | Admitting: *Deleted

## 2018-12-12 DIAGNOSIS — R079 Chest pain, unspecified: Secondary | ICD-10-CM | POA: Diagnosis not present

## 2018-12-12 DIAGNOSIS — Z006 Encounter for examination for normal comparison and control in clinical research program: Secondary | ICD-10-CM

## 2018-12-12 MED ORDER — IVABRADINE HCL 5 MG PO TABS
5.0000 mg | ORAL_TABLET | Freq: Once | ORAL | Status: AC
  Administered 2018-12-12: 5 mg via ORAL
  Filled 2018-12-12: qty 1

## 2018-12-12 MED ORDER — NITROGLYCERIN 0.4 MG SL SUBL
0.8000 mg | SUBLINGUAL_TABLET | Freq: Once | SUBLINGUAL | Status: DC
  Filled 2018-12-12: qty 25

## 2018-12-12 MED ORDER — METOPROLOL TARTRATE 5 MG/5ML IV SOLN
INTRAVENOUS | Status: AC
  Filled 2018-12-12: qty 20

## 2018-12-12 MED ORDER — DILTIAZEM HCL 25 MG/5ML IV SOLN
INTRAVENOUS | Status: AC
  Filled 2018-12-12: qty 5

## 2018-12-12 MED ORDER — DIPHENHYDRAMINE HCL 25 MG PO TABS
25.0000 mg | ORAL_TABLET | Freq: Once | ORAL | Status: AC
  Administered 2018-12-12: 25 mg via ORAL
  Filled 2018-12-12: qty 1

## 2018-12-12 MED ORDER — DILTIAZEM HCL 25 MG/5ML IV SOLN
5.0000 mg | INTRAVENOUS | Status: DC | PRN
  Administered 2018-12-12 (×2): 5 mg via INTRAVENOUS
  Filled 2018-12-12: qty 5

## 2018-12-12 MED ORDER — DIPHENHYDRAMINE HCL 25 MG PO CAPS
ORAL_CAPSULE | ORAL | Status: AC
  Filled 2018-12-12: qty 1

## 2018-12-12 MED ORDER — METOPROLOL TARTRATE 5 MG/5ML IV SOLN
5.0000 mg | INTRAVENOUS | Status: AC | PRN
  Administered 2018-12-12 (×4): 5 mg via INTRAVENOUS

## 2018-12-12 NOTE — Research (Signed)
Subject Name:   [name]. Bhakta   Subject met inclusion and exclusion criteria.  The informed consent form, study requirements and expectations were reviewed with the subject and questions and concerns were addressed prior to the signing of the consent form.  The subject verbalized understanding of the trial requirements.  The subject agreed to participate in the CADFEM trial and signed the informed consent.  The informed consent was obtained prior to performance of any protocol-specific procedures for the subject.  A copy of the signed informed consent was given to the subject and a copy was placed in the subject's medical record.   [place] Chalmers, Research Assistant  12/12/2018 10:40 a.m.

## 2018-12-12 NOTE — Progress Notes (Signed)
Spoke with Dr.McLean regarding pt's HR remaining at 72-72 even after dose of Ivabradine and dose of Benadryl. Scan to be cancelled. Dr. Aundra Dubin with message ordering provider. Pt. D/C home awake and alert. In no distress

## 2018-12-20 ENCOUNTER — Telehealth: Payer: Self-pay | Admitting: *Deleted

## 2018-12-20 DIAGNOSIS — R002 Palpitations: Secondary | ICD-10-CM

## 2018-12-20 DIAGNOSIS — R0609 Other forms of dyspnea: Secondary | ICD-10-CM

## 2018-12-20 DIAGNOSIS — R0600 Dyspnea, unspecified: Secondary | ICD-10-CM

## 2018-12-20 DIAGNOSIS — R079 Chest pain, unspecified: Secondary | ICD-10-CM

## 2018-12-20 NOTE — Telephone Encounter (Signed)
Follow up:    Patient was returning your call back.

## 2018-12-20 NOTE — Telephone Encounter (Signed)
Patient needs visit next week to be moved to an in office visit day and she needs to get her Cardiac CT scheduled  Left message to call back

## 2018-12-21 NOTE — Telephone Encounter (Signed)
Spoke with [name] and she stated that the CT was attempted but unable to complete secondary to elevated HR. She was told this would be discussed with Dr Percival Spanish and a new plan would be put into place. [name] is scheduled for follow up virtual visit next week. [name] does not think she needs in office visit at this time. Will forward to Dr Percival Spanish for review.

## 2018-12-24 NOTE — Telephone Encounter (Signed)
Now that we are doing POETs ([employer] Exercise Treadmills) we could schedule one of these.

## 2018-12-25 NOTE — Telephone Encounter (Signed)
Advised patient, verbalized understanding. Message sent to schedulers to arrange GXT

## 2018-12-26 ENCOUNTER — Telehealth: Payer: Self-pay | Admitting: Cardiology

## 2018-12-26 ENCOUNTER — Encounter: Payer: Self-pay | Admitting: Cardiology

## 2018-12-26 NOTE — Telephone Encounter (Signed)
LVM for patient to call back and schedule GXT.

## 2018-12-27 ENCOUNTER — Telehealth: Payer: Medicare Other | Admitting: Cardiology

## 2019-01-03 ENCOUNTER — Other Ambulatory Visit: Payer: Self-pay

## 2019-01-03 ENCOUNTER — Telehealth: Payer: Self-pay | Admitting: Cardiology

## 2019-01-03 NOTE — Telephone Encounter (Signed)
Patient is having stress test done on 8/4, she wants to know when she needs to go for her COVID test.

## 2019-01-03 NOTE — Telephone Encounter (Signed)
Return call to pt and informed that COVID test is scheduled for 7/31 at 10:45 am. Pt voiced understanding.

## 2019-01-11 ENCOUNTER — Telehealth (HOSPITAL_COMMUNITY): Payer: Self-pay | Admitting: *Deleted

## 2019-01-11 ENCOUNTER — Other Ambulatory Visit (HOSPITAL_COMMUNITY)
Admission: RE | Admit: 2019-01-11 | Discharge: 2019-01-11 | Disposition: A | Discharge status: Home / Self Care | Payer: Medicare Other | Source: Ambulatory Visit | Attending: Cardiology | Admitting: Cardiology

## 2019-01-11 DIAGNOSIS — Z01812 Encounter for preprocedural laboratory examination: Secondary | ICD-10-CM | POA: Insufficient documentation

## 2019-01-11 DIAGNOSIS — Z20828 Contact with and (suspected) exposure to other viral communicable diseases: Secondary | ICD-10-CM | POA: Insufficient documentation

## 2019-01-11 LAB — SARS CORONAVIRUS 2 (TAT 6-24 HRS): SARS Coronavirus 2: NEGATIVE

## 2019-01-11 NOTE — Telephone Encounter (Signed)
Close encounter 

## 2019-01-15 ENCOUNTER — Ambulatory Visit (HOSPITAL_COMMUNITY)
Admission: RE | Admit: 2019-01-15 | Discharge: 2019-01-15 | Disposition: A | Discharge status: Home / Self Care | Payer: Medicare Other | Source: Ambulatory Visit | Attending: Cardiovascular Disease | Admitting: Cardiovascular Disease

## 2019-01-15 ENCOUNTER — Other Ambulatory Visit: Payer: Self-pay

## 2019-01-15 DIAGNOSIS — R079 Chest pain, unspecified: Secondary | ICD-10-CM | POA: Insufficient documentation

## 2019-01-15 DIAGNOSIS — R0609 Other forms of dyspnea: Secondary | ICD-10-CM

## 2019-01-15 DIAGNOSIS — R002 Palpitations: Secondary | ICD-10-CM | POA: Diagnosis not present

## 2019-01-15 DIAGNOSIS — R0600 Dyspnea, unspecified: Secondary | ICD-10-CM

## 2019-01-15 LAB — EXERCISE TOLERANCE TEST
Estimated workload: 6.6 METS
Exercise duration (min): 4 min
Exercise duration (sec): 43 s
MPHR: 155 {beats}/min
Peak HR: 134 {beats}/min
Percent HR: 86 %
RPE: 17
Rest HR: 88 {beats}/min

## 2019-01-20 NOTE — Progress Notes (Signed)
Cardiology Office Note   Date:  01/22/2019   ID:  [name], DOB 1955/12/30, MRN [id number]  PCP:  [name], MD  Cardiologist:   No primary care provider on file.  Chief Complaint  Patient presents with  . Edema      History of Present Illness: [name] is a 63 y.o. female who is referred by [name], MD for evaluation of palpitations and arm pain.     I did see her in March 2015 when she was briefly hospitalized  For evaluation of chest pain. This was atypical. Follow-up stress testing was unremarkable for any evidence ischemia.   I saw her last in 2016 preop prior to tonsillectomy.  She had the tonsillectomy finally in January of this year.  He had to be done in [place] because it was a complicated issue.  She said she did well with this.  I do see that she also had an echocardiogram in 2017 and I reviewed this and there were no significant abnormalities.  I saw her earlier this year and she had palpitations.  She returns for follow-up.  He is not having these palpitations.  However, she does have increased leg swelling.  She is not really complaining of chest discomfort that she was describing either.  She had some mild discomfort.  Recently her daughter 1 syncopal bypass.  She is not describing chest pressure, neck or arm discomfort.  Her weight is probably been up a little bit.  She sitting at a computer for about 15 hours a day unfortunately.   Past Medical History:  Diagnosis Date  . Complication of anesthesia    " I have to be awake to be intubated because I have a mass in my throut "  . Diabetes mellitus   . Difficult intubation    mass in throut  . Diverticulitis   . Hyperlipidemia   . Hypertension   . Migraines 2013  . Sleep apnea    CPAP    Past Surgical History:  Procedure Laterality Date  . ABDOMINAL HYSTERECTOMY    . GALLBLADDER SURGERY    . KNEE ARTHROSCOPY    . lymph nodectomy    . NASAL SINUS SURGERY    . SPLENIC  ARTERY EMBOLIZATION     due to Aneurysm  . TONSILLECTOMY       Current Outpatient Medications  Medication Sig Dispense Refill  . acetaminophen (TYLENOL) 500 MG tablet Take 500 mg by mouth every 6 (six) hours as needed (for pain).    Marland Kitchen albuterol (VENTOLIN HFA) 108 (90 BASE) MCG/ACT inhaler Inhale 2 puffs into the lungs every 6 (six) hours as needed for wheezing or shortness of breath.     . clonazePAM (KLONOPIN) 0.5 MG tablet Take 0.5 mg by mouth at bedtime. For sleep    . CRESTOR 20 MG tablet Take 10 mg by mouth daily.    Marland Kitchen diltiazem (DILT-XR) 180 MG 24 hr capsule DILT-XR 180 mg capsule, extended release  TAKE 1 CAPSULE BY MOUTH EVERY DAY    . EPINEPHrine (EPIPEN 2-PAK) 0.3 mg/0.3 mL IJ SOAJ injection Inject 0.3 mLs as directed as needed (for an anaphylactic reaction).     Marland Kitchen esomeprazole (NEXIUM) 40 MG capsule Take 40 mg by mouth daily.     . Eyelid Cleansers (AVENOVA) 0.01 % SOLN Place 1 application into the right eye daily.     . famotidine (PEPCID) 40 MG tablet famotidine 40 mg tablet  TAKE 1 TABLET  BY MOUTH EVERYDAY AT BEDTIME    . fluticasone (FLONASE) 50 MCG/ACT nasal spray Place 1 spray into both nostrils daily as needed for allergies or rhinitis.     . Fluticasone-Salmeterol (ADVAIR DISKUS) 500-50 MCG/DOSE AEPB Inhale 1 puff into the lungs daily as needed (for shortness of breath).     . furosemide (LASIX) 40 MG tablet Take 40 mg by mouth daily.     . methocarbamol (ROBAXIN) 500 MG tablet Take 500 mg by mouth 3 (three) times daily as needed for muscle spasms.     . montelukast (SINGULAIR) 10 MG tablet Take 10 mg by mouth at bedtime.     . Multiple Vitamin (MULTIVITAMIN WITH MINERALS) TABS tablet Take 1 tablet by mouth daily.    . ondansetron (ZOFRAN) 4 MG tablet Take 1 tablet (4 mg total) by mouth every 6 (six) hours as needed for nausea. 20 tablet 0  . predniSONE (DELTASONE) 50 MG tablet Take 1 tablet 13 hour prior to procedure, take another 7 hour prior to procedure and last  tablet 1 hour prior to procedure 3 tablet 0  . quinapril (ACCUPRIL) 40 MG tablet Take 40 mg by mouth daily.     . RESTASIS 0.05 % ophthalmic emulsion Place 1 drop into both eyes 2 (two) times daily.  3  . saccharomyces boulardii (FLORASTOR) 250 MG capsule Take 1 capsule (250 mg total) by mouth 2 (two) times daily. (Patient taking differently: Take 250 mg by mouth daily. ) 60 capsule 0  . diphenhydrAMINE (BENADRYL) 50 MG tablet Take 1 tablet (50 mg total) by mouth once for 1 dose. 1 tablet 0  . metoprolol tartrate (LOPRESSOR) 100 MG tablet Take 1 tablet (100 mg total) by mouth once for 1 dose. 1 tablet 0   No current facility-administered medications for this visit.     Allergies:   Aspirin, Bee venom, Demerol [meperidine], Ibuprofen, Meperidine hcl, Nsaids, Iohexol, Clindamycin/lincomycin, Codeine, Ivp dye [iodinated diagnostic agents], Mupirocin, Neosporin [neomycin-bacitracin zn-polymyx], Pollen extract, Sulfonamide derivatives, Tramadol, Cephalosporins, and Penicillins    ROS:  Please see the history of present illness.   Otherwise, review of systems are positive for none.   All other systems are reviewed and negative.    PHYSICAL EXAM: VS:  BP 100/60   Pulse 77   Temp (!) 96.8 F (36 C) (Temporal)   Ht 5' 8.5" (1.74 m)   Wt 231 lb 12.8 oz (105.1 kg)   SpO2 95%   BMI 34.73 kg/m  , BMI Body mass index is 34.73 kg/m. GENERAL:  Well appearing NECK:  No jugular venous distention, waveform within normal limits, carotid upstroke brisk and symmetric, no bruits, no thyromegaly LUNGS:  Clear to auscultation bilaterally CHEST:  Unremarkable HEART:  PMI not displaced or sustained,S1 and S2 within normal limits, no S3, no S4, no clicks, no rubs, no murmurs ABD:  Flat, positive bowel sounds normal in frequency in pitch, no bruits, no rebound, no guarding, no midline pulsatile mass, no hepatomegaly, no splenomegaly EXT:  2 plus pulses throughout, moderate leg edema, no cyanosis no clubbing    EKG:  EKG is not ordered today.   Recent Labs: 03/06/2018: Hemoglobin 14.1; Platelets 361 11/22/2018: BNP 16.6; BUN 10; Creatinine, Ser 1.18; Potassium 4.5; Sodium 142    Lipid Panel    Component Value Date/Time   CHOL  07/20/2009 0343    170        ATP III CLASSIFICATION:  <200     mg/dL   Desirable  200-239  mg/dL   Borderline High  >=240    mg/dL   High          TRIG 170 (H) 07/20/2009 0343   HDL 43 07/20/2009 0343   CHOLHDL 4.0 07/20/2009 0343   VLDL 34 07/20/2009 0343   LDLCALC  07/20/2009 0343    93        Total Cholesterol/HDL:CHD Risk Coronary Heart Disease Risk Table                     Men   Women  1/2 Average Risk   3.4   3.3  Average Risk       5.0   4.4  2 X Average Risk   9.6   7.1  3 X Average Risk  23.4   11.0        Use the calculated Patient Ratio above and the CHD Risk Table to determine the patient's CHD Risk.        ATP III CLASSIFICATION (LDL):  <100     mg/dL   Optimal  100-129  mg/dL   Near or Above                    Optimal  130-159  mg/dL   Borderline  160-189  mg/dL   High  >190     mg/dL   Very High      Wt Readings from Last 3 Encounters:  01/22/19 231 lb 12.8 oz (105.1 kg)  11/22/18 226 lb 3.2 oz (102.6 kg)  10/13/17 220 lb (99.8 kg)      Other studies Reviewed: Additional studies/ records that were reviewed today include: Labs, POET (Plain Old Exercise Treadmill) Review of the above records demonstrates:  Please see elsewhere in the .     ASSESSMENT AND PLAN:  PALPITATIONS: She is not particularly complaining of this.  Recommend any changes at this point.  No further imaging is indicated.  No further monitoring.  HTN:   BP is controlled.  No change in therapy.   DOE:   BNP was normal.  Her breathing is better. I will treate with diuresis as below.   CHEST PAIN: She had a negative POET (Plain Old Exercise Treadmill).    She is no longer having this.  No further work up.   EDEMA: I think this is probably related to her  legs being down.  We talked about low-salt.  Have her take increased Lasix for 3 days.  She can keep her feet elevated and use compression stockings.  She will let me know if this is not improved.  Current medicines are reviewed at length with the patient today.  The patient does not have concerns regarding medicines.  The following changes have been made:  As above  Labs/ tests ordered today include:  None  No orders of the defined types were placed in this encounter.   Disposition:   FU with in me or APP in 3 months if the swelling is not improved.    Signed, Minus Breeding, MD  01/22/2019 2:58 PM    [place]

## 2019-01-22 ENCOUNTER — Encounter: Payer: Self-pay | Admitting: Cardiology

## 2019-01-22 ENCOUNTER — Ambulatory Visit (INDEPENDENT_AMBULATORY_CARE_PROVIDER_SITE_OTHER): Payer: Medicare Other | Admitting: Cardiology

## 2019-01-22 ENCOUNTER — Other Ambulatory Visit: Payer: Self-pay

## 2019-01-22 VITALS — BP 100/60 | HR 77 | Temp 96.8°F | Ht 68.5 in | Wt 231.8 lb

## 2019-01-22 DIAGNOSIS — I10 Essential (primary) hypertension: Secondary | ICD-10-CM

## 2019-01-22 DIAGNOSIS — R0789 Other chest pain: Secondary | ICD-10-CM

## 2019-01-22 DIAGNOSIS — M7989 Other specified soft tissue disorders: Secondary | ICD-10-CM

## 2019-01-22 DIAGNOSIS — R079 Chest pain, unspecified: Secondary | ICD-10-CM

## 2019-01-22 NOTE — Patient Instructions (Signed)
Medication Instructions:  TAKE additional lasix 20mg  for THREE DAYS If you need a refill on your cardiac medications before your next appointment, please call your pharmacy.   Follow-Up: At [facility], you and your health needs are our priority.  As part of our continuing mission to provide you with exceptional heart care, we have created designated Provider Care Teams.  These Care Teams include your primary Cardiologist (physician) and Advanced Practice Providers (APPs -  Physician Assistants and Nurse Practitioners) who all work together to provide you with the care you need, when you need it. You will need a follow up appointment in 3-4 months.  Please call our office 2 months in advance to schedule this appointment. He recommends you follow up sooner (about 3 months) if swelling persists.  You may see Dr. Percival Spanish or one of the following Advanced Practice Providers on your designated Care Team:   Rosaria Ferries, PA-C . Jory Sims, DNP, ANP  Any Other Special Instructions Will Be Listed Below (If Applicable).

## 2019-04-02 ENCOUNTER — Telehealth: Payer: Self-pay | Admitting: Cardiology

## 2019-04-02 NOTE — Telephone Encounter (Signed)
LVM for patient to call and schedule 3-4 month followup with Dr. Percival Spanish.

## 2019-06-30 DIAGNOSIS — Z7189 Other specified counseling: Secondary | ICD-10-CM | POA: Insufficient documentation

## 2019-06-30 DIAGNOSIS — M7989 Other specified soft tissue disorders: Secondary | ICD-10-CM | POA: Insufficient documentation

## 2019-06-30 DIAGNOSIS — R0602 Shortness of breath: Secondary | ICD-10-CM | POA: Insufficient documentation

## 2019-06-30 NOTE — Progress Notes (Signed)
Cardiology Office Note   Date:  07/01/2019   ID:  [name], DOB 1955-12-21, MRN [id number]  PCP:  Katrina Solian, MD  Cardiologist:   No primary care provider on file.   No chief complaint on file.    History of Present Illness: [name] is a 65 y.o. female who is referred by Katrina Solian, MD for evaluation of palpitations and arm pain.     I saw see her in March 2015 when she was briefly hospitalized  For evaluation of chest pain. This was atypical. Follow-up stress testing was unremarkable for any evidence ischemia.   I saw her in 2016 preop prior to tonsillectomy.  Se also had an echocardiogram in 2017 and there were no significant abnormalities. I saw her last year for palpitations.    She has done better since I saw her.  She retired from [employer] but is going to go back to work just part-time.  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  She feels better.  She is not having any new palpitations, presyncope or syncope.  Has had no chest pressure, neck or arm discomfort.  She is walking for exercise.  She has much less swelling in her legs.  She is lost a little weight.  She does have occasional pain in her legs after she is done walking but this is behind her knee and only at rest with some cramping at night.  She is not describing claudication.  Past Medical History:  Diagnosis Date  . Complication of anesthesia    " I have to be awake to be intubated because I have a mass in my throut "  . Diabetes mellitus   . Difficult intubation    mass in throut  . Diverticulitis   . Hyperlipidemia   . Hypertension   . Migraines 2013  . Sleep apnea    CPAP    Past Surgical History:  Procedure Laterality Date  . ABDOMINAL HYSTERECTOMY    . GALLBLADDER SURGERY    . KNEE ARTHROSCOPY    . lymph nodectomy    . NASAL SINUS SURGERY    . SPLENIC ARTERY EMBOLIZATION     due to Aneurysm  . TONSILLECTOMY       Current Outpatient Medications   Medication Sig Dispense Refill  . acetaminophen (TYLENOL) 500 MG tablet Take 500 mg by mouth every 6 (six) hours as needed (for pain).    Marland Kitchen albuterol (VENTOLIN HFA) 108 (90 BASE) MCG/ACT inhaler Inhale 2 puffs into the lungs every 6 (six) hours as needed for wheezing or shortness of breath.     . clonazePAM (KLONOPIN) 0.5 MG tablet Take 0.5 mg by mouth at bedtime. For sleep    . CRESTOR 20 MG tablet Take 10 mg by mouth daily.    Marland Kitchen diltiazem (DILT-XR) 180 MG 24 hr capsule DILT-XR 180 mg capsule, extended release  TAKE 1 CAPSULE BY MOUTH EVERY DAY    . diphenhydrAMINE (BENADRYL) 50 MG tablet Take 1 tablet (50 mg total) by mouth once for 1 dose. 1 tablet 0  . EPINEPHrine (EPIPEN 2-PAK) 0.3 mg/0.3 mL IJ SOAJ injection Inject 0.3 mLs as directed as needed (for an anaphylactic reaction).     Marland Kitchen esomeprazole (NEXIUM) 40 MG capsule Take 40 mg by mouth daily.     . Eyelid Cleansers (AVENOVA) 0.01 % SOLN Place 1 application into the right eye daily.     . famotidine (  PEPCID) 40 MG tablet famotidine 40 mg tablet  TAKE 1 TABLET BY MOUTH EVERYDAY AT BEDTIME    . fluticasone (FLONASE) 50 MCG/ACT nasal spray Place 1 spray into both nostrils daily as needed for allergies or rhinitis.     . Fluticasone-Salmeterol (ADVAIR DISKUS) 500-50 MCG/DOSE AEPB Inhale 1 puff into the lungs daily as needed (for shortness of breath).     . furosemide (LASIX) 40 MG tablet Take 40 mg by mouth daily.     . methocarbamol (ROBAXIN) 500 MG tablet Take 500 mg by mouth 3 (three) times daily as needed for muscle spasms.     . metoprolol tartrate (LOPRESSOR) 100 MG tablet Take 1 tablet (100 mg total) by mouth once for 1 dose. 1 tablet 0  . montelukast (SINGULAIR) 10 MG tablet Take 10 mg by mouth at bedtime.     . Multiple Vitamin (MULTIVITAMIN WITH MINERALS) TABS tablet Take 1 tablet by mouth daily.    . ondansetron (ZOFRAN) 4 MG tablet Take 1 tablet (4 mg total) by mouth every 6 (six) hours as needed for nausea. 20 tablet 0  .  predniSONE (DELTASONE) 50 MG tablet Take 1 tablet 13 hour prior to procedure, take another 7 hour prior to procedure and last tablet 1 hour prior to procedure 3 tablet 0  . quinapril (ACCUPRIL) 40 MG tablet Take 40 mg by mouth daily.     . RESTASIS 0.05 % ophthalmic emulsion Place 1 drop into both eyes 2 (two) times daily.  3  . saccharomyces boulardii (FLORASTOR) 250 MG capsule Take 1 capsule (250 mg total) by mouth 2 (two) times daily. (Patient taking differently: Take 250 mg by mouth daily. ) 60 capsule 0   No current facility-administered medications for this visit.    Allergies:   Aspirin, Bee venom, Demerol [meperidine], Ibuprofen, Meperidine hcl, Nsaids, Iohexol, Clindamycin/lincomycin, Codeine, Ivp dye [iodinated diagnostic agents], Mupirocin, Neosporin [neomycin-bacitracin zn-polymyx], Pollen extract, Sulfonamide derivatives, Tramadol, Cephalosporins, and Penicillins    ROS:  Please see the history of present illness.   Otherwise, review of systems are positive for none.   All other systems are reviewed and negative.    PHYSICAL EXAM: VS:  BP 120/78   Pulse 85   Temp (!) 96.9 F (36.1 C)   Ht 5\' 8"  (1.727 m)   Wt 226 lb 9.6 oz (102.8 kg)   SpO2 97%   BMI 34.45 kg/m  , BMI Body mass index is 34.45 kg/m. GENERAL:  Well appearing NECK:  No jugular venous distention, waveform within normal limits, carotid upstroke brisk and symmetric, no bruits, no thyromegaly LUNGS:  Clear to auscultation bilaterally CHEST:  Unremarkable HEART:  PMI not displaced or sustained,S1 and S2 within normal limits, no S3, no S4, no clicks, no rubs, no murmurs ABD:  Flat, positive bowel sounds normal in frequency in pitch, no bruits, no rebound, no guarding, no midline pulsatile mass, no hepatomegaly, no splenomegaly EXT:  2 plus pulses throughout, trace bilateral leg edema, no cyanosis no clubbing   EKG:  EKG is  ordered today. Sinus rhythm, rate 85, axis within normal limits, intervals within normal  limits, no acute ST-T wave changes.  Recent Labs: 11/22/2018: BNP 16.6; BUN 10; Creatinine, Ser 1.18; Potassium 4.5; Sodium 142    Lipid Panel    Wt Readings from Last 3 Encounters:  07/01/19 226 lb 9.6 oz (102.8 kg)  01/22/19 231 lb 12.8 oz (105.1 kg)  11/22/18 226 lb 3.2 oz (102.6 kg)  Other studies Reviewed: Additional studies/ records that were reviewed today include: None Review of the above records demonstrates:  Please see elsewhere in the .     ASSESSMENT AND PLAN:  PALPITATIONS:   Patient's not particular bothered by these.  No change in therapy.  HTN:   BP is well controlled.  No change in therapy.   DOE:   This is improved.  BNP was normal.  She had a normal echo a few years ago.  No further work-up.  She will continue with exercise and weight loss.   CHEST PAIN:   She has had no further chest pain.  No change in therapy.  EDEMA:    This is improved as she is walking and doing sitting.  No change in therapy.  COVID EDUCATION: She will be able to get the vaccine when she goes back to work [employer].   Current medicines are reviewed at length with the patient today.  The patient does not have concerns regarding medicines.  The following changes have been made:  None  Labs/ tests ordered today include:  None  Orders Placed This Encounter  Procedures  . EKG 12-Lead    Disposition:   FU with me as needed.  Signed, Minus Breeding, MD  07/01/2019 9:58 AM    [facility]

## 2019-07-01 ENCOUNTER — Encounter: Payer: Self-pay | Admitting: Cardiology

## 2019-07-01 ENCOUNTER — Other Ambulatory Visit: Payer: Self-pay

## 2019-07-01 ENCOUNTER — Ambulatory Visit (INDEPENDENT_AMBULATORY_CARE_PROVIDER_SITE_OTHER): Payer: Medicare Other | Admitting: Cardiology

## 2019-07-01 VITALS — BP 120/78 | HR 85 | Temp 96.9°F | Ht 68.0 in | Wt 226.6 lb

## 2019-07-01 DIAGNOSIS — Z7189 Other specified counseling: Secondary | ICD-10-CM

## 2019-07-01 DIAGNOSIS — R0602 Shortness of breath: Secondary | ICD-10-CM | POA: Diagnosis not present

## 2019-07-01 DIAGNOSIS — M7989 Other specified soft tissue disorders: Secondary | ICD-10-CM | POA: Diagnosis not present

## 2019-07-01 DIAGNOSIS — R079 Chest pain, unspecified: Secondary | ICD-10-CM | POA: Diagnosis not present

## 2019-07-01 DIAGNOSIS — I10 Essential (primary) hypertension: Secondary | ICD-10-CM | POA: Diagnosis not present

## 2019-07-01 NOTE — Patient Instructions (Signed)
Medication Instructions:  No changes *If you need a refill on your cardiac medications before your next appointment, please call your pharmacy*  Lab Work: None  Testing/Procedures: None  Follow-Up: At [facility], you and your health needs are our priority.  As part of our continuing mission to provide you with exceptional heart care, we have created designated Provider Care Teams.  These Care Teams include your primary Cardiologist (physician) and Advanced Practice Providers (APPs -  Physician Assistants and Nurse Practitioners) who all work together to provide you with the care you need, when you need it.  Other Instructions Follow up as needed

## 2019-07-03 ENCOUNTER — Other Ambulatory Visit: Payer: Self-pay

## 2019-07-03 ENCOUNTER — Ambulatory Visit: Payer: Medicare Other | Attending: Internal Medicine

## 2019-07-03 DIAGNOSIS — Z20822 Contact with and (suspected) exposure to covid-19: Secondary | ICD-10-CM

## 2019-07-04 ENCOUNTER — Other Ambulatory Visit: Payer: Medicare Other

## 2019-07-04 LAB — NOVEL CORONAVIRUS, NAA: SARS-CoV-2, NAA: NOT DETECTED

## 2019-09-18 ENCOUNTER — Telehealth: Payer: Self-pay

## 2019-09-18 ENCOUNTER — Inpatient Hospital Stay: Admission: RE | Admit: 2019-09-18 | Payer: Medicare Other | Source: Ambulatory Visit

## 2019-09-18 ENCOUNTER — Other Ambulatory Visit: Payer: Self-pay | Admitting: Gastroenterology

## 2019-09-18 ENCOUNTER — Other Ambulatory Visit: Payer: Self-pay | Admitting: *Deleted

## 2019-09-18 DIAGNOSIS — K5792 Diverticulitis of intestine, part unspecified, without perforation or abscess without bleeding: Secondary | ICD-10-CM

## 2019-09-18 NOTE — Telephone Encounter (Signed)
Phone call to patient to review instructions for 13 hr prep for CT w/ contrast on 09/20/2019 at 9:00 AM. Pt reports she has taken 13 hour preps for contrast before and tolerated it well. Prescription called into [facility]. Pt aware and verbalized understanding of instructions. Prescription: 09/19/19 8 PM- 50mg  Prednisone 09/20/19 2 AM- 50mg  Prednisone 09/20/19 8 AM - 50mg  Prednisone and 50mg  Benadryl

## 2019-09-18 NOTE — Telephone Encounter (Signed)
LMOM to inform [facility] staff that patient's ordering physician has changed her scan to "without contrast," so would they please cancel our 13-hour prep request.

## 2019-09-20 ENCOUNTER — Ambulatory Visit
Admission: RE | Admit: 2019-09-20 | Discharge: 2019-09-20 | Disposition: A | Discharge status: Home / Self Care | Payer: Medicare Other | Source: Ambulatory Visit | Attending: Gastroenterology | Admitting: Gastroenterology

## 2019-09-20 ENCOUNTER — Other Ambulatory Visit: Payer: Self-pay

## 2019-09-20 DIAGNOSIS — K5792 Diverticulitis of intestine, part unspecified, without perforation or abscess without bleeding: Secondary | ICD-10-CM

## 2019-10-23 ENCOUNTER — Other Ambulatory Visit: Payer: Self-pay | Admitting: Internal Medicine

## 2019-10-23 DIAGNOSIS — Z1231 Encounter for screening mammogram for malignant neoplasm of breast: Secondary | ICD-10-CM

## 2019-11-12 ENCOUNTER — Other Ambulatory Visit: Payer: Self-pay

## 2019-11-12 ENCOUNTER — Ambulatory Visit
Admission: RE | Admit: 2019-11-12 | Discharge: 2019-11-12 | Disposition: A | Discharge status: Home / Self Care | Payer: Medicare Other | Source: Ambulatory Visit | Attending: Internal Medicine | Admitting: Internal Medicine

## 2019-11-12 DIAGNOSIS — Z1231 Encounter for screening mammogram for malignant neoplasm of breast: Secondary | ICD-10-CM

## 2019-11-14 ENCOUNTER — Other Ambulatory Visit: Payer: Self-pay | Admitting: Internal Medicine

## 2019-11-14 DIAGNOSIS — R928 Other abnormal and inconclusive findings on diagnostic imaging of breast: Secondary | ICD-10-CM

## 2019-11-18 ENCOUNTER — Ambulatory Visit
Admission: RE | Admit: 2019-11-18 | Discharge: 2019-11-18 | Disposition: A | Discharge status: Home / Self Care | Payer: Medicare Other | Source: Ambulatory Visit | Attending: Internal Medicine | Admitting: Internal Medicine

## 2019-11-18 ENCOUNTER — Other Ambulatory Visit: Payer: Self-pay

## 2019-11-18 DIAGNOSIS — R928 Other abnormal and inconclusive findings on diagnostic imaging of breast: Secondary | ICD-10-CM

## 2020-02-01 ENCOUNTER — Other Ambulatory Visit: Payer: Self-pay

## 2020-02-01 ENCOUNTER — Ambulatory Visit
Admission: EM | Admit: 2020-02-01 | Discharge: 2020-02-01 | Disposition: A | Discharge status: Home / Self Care | Payer: Medicare Other | Attending: Physician Assistant | Admitting: Physician Assistant

## 2020-02-01 ENCOUNTER — Encounter: Payer: Self-pay | Admitting: Physician Assistant

## 2020-02-01 DIAGNOSIS — J029 Acute pharyngitis, unspecified: Secondary | ICD-10-CM | POA: Diagnosis not present

## 2020-02-01 DIAGNOSIS — R519 Headache, unspecified: Secondary | ICD-10-CM

## 2020-02-01 MED ORDER — AZELASTINE HCL 0.1 % NA SOLN: 2.0000 | Freq: Two times a day (BID) | NASAL | 0 refills | Status: DC

## 2020-02-01 MED ORDER — PREDNISONE 50 MG PO TABS: 50.0000 mg | ORAL_TABLET | Freq: Every day | ORAL | 0 refills | Status: DC

## 2020-02-01 NOTE — ED Triage Notes (Signed)
Patient c/o sore throat and headache x four days.

## 2020-02-01 NOTE — Discharge Instructions (Addendum)
COVID PCR testing ordered. I would like you to quarantine until testing results. Prednisone for sinus headache. Flonase and/or azelastine nasal spray for nasal congestion/sinus pressure. Tylenol/motrin for pain and fever. Keep hydrated, urine should be clear to pale yellow in color. If experiencing shortness of breath, trouble breathing, go to the emergency department for further evaluation needed.

## 2020-02-01 NOTE — ED Provider Notes (Signed)
[facility]    CSN: [id number] Arrival date & time: 02/01/20  0803      History   Chief Complaint Chief Complaint  Patient presents with   Sore Throat   Headache    HPI [name] is a 64 y.o. female.   64 year old female comes in for 4 day of URI symptoms. Sore throat, headache, nasal congestion. No cough. Denies fever, chills, body aches. Denies abdominal pain, nausea, vomiting, diarrhea. Denies shortness of breath, loss of taste/smell. Never smoker. Fully COVID vaccinated.   Last a1c 6.2.      Past Medical History:  Diagnosis Date   Complication of anesthesia    " I have to be awake to be intubated because I have a mass in my throut "   Diabetes mellitus    Difficult intubation    mass in throut   Diverticulitis    Hyperlipidemia    Hypertension    Migraines 2013   Sleep apnea    CPAP    Patient Active Problem List   Diagnosis Date Noted   Leg swelling 06/30/2019   SOB (shortness of breath) 06/30/2019   Educated about COVID-19 virus infection 06/30/2019   AKI (acute kidney injury) ([place]) 03/20/2016   DM type 2, goal HbA1c < 7% (HCC) 03/20/2016   Chronic diastolic CHF (congestive heart failure) ([place]) 03/20/2016   Sigmoid diverticulitis 03/20/2016   Diverticulitis 03/20/2016   Leukocytosis 03/19/2016   Chest pain 08/31/2013   CHEST PAIN-UNSPECIFIED 08/04/2009   THYROID NODULE, LEFT 07/30/2009   Diabetes mellitus ([place]) 07/30/2009   HYPERLIPIDEMIA 07/30/2009   Obstructive sleep apnea 07/30/2009   Essential hypertension 07/30/2009   GERD 07/30/2009   Irritable bowel syndrome 07/30/2009    Past Surgical History:  Procedure Laterality Date   ABDOMINAL HYSTERECTOMY     GALLBLADDER SURGERY     KNEE ARTHROSCOPY     lymph nodectomy     NASAL SINUS SURGERY     SPLENIC ARTERY EMBOLIZATION     due to Aneurysm   TONSILLECTOMY      OB History   No obstetric history on file.      Home Medications     Prior to Admission medications   Medication Sig Start Date End Date Taking? Authorizing Provider  acetaminophen (TYLENOL) 500 MG tablet Take 500 mg by mouth every 6 (six) hours as needed (for pain).    [provider]  albuterol (VENTOLIN HFA) 108 (90 BASE) MCG/ACT inhaler Inhale 2 puffs into the lungs every 6 (six) hours as needed for wheezing or shortness of breath.  02/08/12   [provider]  azelastine (ASTELIN) 0.1 % nasal spray Place 2 sprays into both nostrils 2 (two) times daily. 02/01/20   Tasia Catchings, Helix Lafontaine V, PA-C  clonazePAM (KLONOPIN) 0.5 MG tablet Take 0.5 mg by mouth at bedtime. For sleep 08/16/13   [provider]  CRESTOR 20 MG tablet Take 10 mg by mouth daily. 08/07/13   [provider]  diltiazem (DILT-XR) 180 MG 24 hr capsule DILT-XR 180 mg capsule, extended release  TAKE 1 CAPSULE BY MOUTH EVERY DAY    [provider]  diphenhydrAMINE (BENADRYL) 50 MG tablet Take 1 tablet (50 mg total) by mouth once for 1 dose. 11/22/18 11/22/18  Minus Breeding, MD  EPINEPHrine (EPIPEN 2-PAK) 0.3 mg/0.3 mL IJ SOAJ injection Inject 0.3 mLs as directed as needed (for an anaphylactic reaction).  02/14/13   [provider]  esomeprazole (NEXIUM) 40 MG capsule Take  40 mg by mouth daily.  02/07/12   [provider]  Eyelid Cleansers (AVENOVA) 0.01 % SOLN Place 1 application into the right eye daily.  08/16/17   [provider]  famotidine (PEPCID) 40 MG tablet famotidine 40 mg tablet  TAKE 1 TABLET BY MOUTH EVERYDAY AT BEDTIME 09/25/18   [provider]  fluticasone (FLONASE) 50 MCG/ACT nasal spray Place 1 spray into both nostrils daily as needed for allergies or rhinitis.     [provider]  Fluticasone-Salmeterol (ADVAIR DISKUS) 500-50 MCG/DOSE AEPB Inhale 1 puff into the lungs daily as needed (for shortness of breath).  02/08/12   [provider]  furosemide (LASIX) 40 MG tablet Take 40 mg by mouth daily.  08/07/13    [provider]  methocarbamol (ROBAXIN) 500 MG tablet Take 500 mg by mouth 3 (three) times daily as needed for muscle spasms.  06/05/13   [provider]  metoprolol tartrate (LOPRESSOR) 100 MG tablet Take 1 tablet (100 mg total) by mouth once for 1 dose. 11/22/18 11/22/18  Minus Breeding, MD  montelukast (SINGULAIR) 10 MG tablet Take 10 mg by mouth at bedtime.  02/21/15   [provider]  Multiple Vitamin (MULTIVITAMIN WITH MINERALS) TABS tablet Take 1 tablet by mouth daily.    [provider]  ondansetron (ZOFRAN) 4 MG tablet Take 1 tablet (4 mg total) by mouth every 6 (six) hours as needed for nausea. 03/24/16   Theodis Blaze, MD  predniSONE (DELTASONE) 50 MG tablet Take 1 tablet 13 hour prior to procedure, take another 7 hour prior to procedure and last tablet 1 hour prior to procedure 11/22/18   Minus Breeding, MD  predniSONE (DELTASONE) 50 MG tablet Take 1 tablet (50 mg total) by mouth daily with breakfast. 02/01/20   Tasia Catchings, Riddik Senna V, PA-C  quinapril (ACCUPRIL) 40 MG tablet Take 40 mg by mouth daily.  08/07/13   [provider]  RESTASIS 0.05 % ophthalmic emulsion Place 1 drop into both eyes 2 (two) times daily. 08/23/17   [provider]  saccharomyces boulardii (FLORASTOR) 250 MG capsule Take 1 capsule (250 mg total) by mouth 2 (two) times daily. Patient taking differently: Take 250 mg by mouth daily.  03/24/16   Theodis Blaze, MD    Family History Family History  Problem Relation Age of Onset   Diabetes Father    Hypertension Father    CAD Daughter 50       CABG X 3    Social History Social History   Tobacco Use   Smoking status: Never Smoker   Smokeless tobacco: Never Used  Substance Use Topics   Alcohol use: No   Drug use: No     Allergies   Aspirin, Bee venom, Demerol [meperidine], Ibuprofen, Meperidine hcl, Nsaids, Iohexol, Clindamycin/lincomycin, Codeine, Ivp dye [iodinated diagnostic agents], Mupirocin, Neosporin  [neomycin-bacitracin zn-polymyx], Pollen extract, Sulfonamide derivatives, Tramadol, Cephalosporins, and Penicillins   Review of Systems Review of Systems  Reason unable to perform ROS: See HPI as above.     Physical Exam Triage Vital Signs ED Triage Vitals  Enc Vitals Group     BP 02/01/20 0817 (!) 159/107     Pulse Rate 02/01/20 0817 93     Resp 02/01/20 0817 16     Temp 02/01/20 0817 98.2 F (36.8 C)     Temp Source 02/01/20 0817 Oral     SpO2 02/01/20 0817 95 %     Weight --  Height --      Head Circumference --      Peak Flow --      Pain Score 02/01/20 0820 6     Pain Loc --      Pain Edu? --      Excl. in [place]? --    No data found.  Updated Vital Signs BP (!) 159/107 (BP Location: Left Arm)    Pulse 93    Temp 98.2 F (36.8 C) (Oral)    Resp 16    SpO2 95%   Physical Exam Constitutional:      General: She is not in acute distress.    Appearance: Normal appearance. She is well-developed. She is not ill-appearing, toxic-appearing or diaphoretic.  HENT:     Head: Normocephalic and atraumatic.     Right Ear: Tympanic membrane, ear canal and external ear normal. Tympanic membrane is not erythematous or bulging.     Left Ear: Tympanic membrane, ear canal and external ear normal. Tympanic membrane is not erythematous or bulging.     Nose:     Right Sinus: Frontal sinus tenderness present. No maxillary sinus tenderness.     Left Sinus: Frontal sinus tenderness present. No maxillary sinus tenderness.     Mouth/Throat:     Mouth: Mucous membranes are moist.     Pharynx: Oropharynx is clear. Uvula midline.  Eyes:     Conjunctiva/sclera: Conjunctivae normal.     Pupils: Pupils are equal, round, and reactive to light.  Cardiovascular:     Rate and Rhythm: Normal rate and regular rhythm.  Pulmonary:     Effort: Pulmonary effort is normal.     Comments: LCTAB Musculoskeletal:     Cervical back: Normal range of motion and neck supple.  Skin:    General: Skin is  warm and dry.  Neurological:     Mental Status: She is alert and oriented to person, place, and time.      UC Treatments / Results  Labs (all labs ordered are listed, but only abnormal results are displayed) Labs Reviewed  NOVEL CORONAVIRUS, NAA    EKG   Radiology No results found.  Procedures Procedures (including critical care time)  Medications Ordered in UC Medications - No data to display  Initial Impression / Assessment and Plan / UC Course  I have reviewed the triage vital signs and the nursing notes.  Pertinent labs & imaging results that were available during my care of the patient were reviewed by me and considered in my medical decision making (see chart for details).    COVID PCR test ordered. Patient to quarantine until testing results return. No alarming signs on exam. LCTAB. Discussed last a1c allows use of prednisone to help with sinus pressure, can shorten course of prednisone if symptoms improving. Monitor CBG. Other smptomatic treatment discussed.  Push fluids.  Return precautions given.   Final Clinical Impressions(s) / UC Diagnoses   Final diagnoses:  Sore throat  Frontal headache    ED Prescriptions    Medication Sig Dispense Auth. Provider   predniSONE (DELTASONE) 50 MG tablet Take 1 tablet (50 mg total) by mouth daily with breakfast. 5 tablet Deliliah Spranger V, PA-C   azelastine (ASTELIN) 0.1 % nasal spray Place 2 sprays into both nostrils 2 (two) times daily. 30 mL Ok Edwards, PA-C     PDMP not reviewed this encounter.   Ok Edwards, PA-C 02/01/20 [phone]

## 2020-02-02 LAB — SARS-COV-2, NAA 2 DAY TAT

## 2020-02-02 LAB — NOVEL CORONAVIRUS, NAA: SARS-CoV-2, NAA: NOT DETECTED

## 2020-06-03 ENCOUNTER — Other Ambulatory Visit: Payer: Self-pay

## 2020-06-03 ENCOUNTER — Ambulatory Visit
Admission: RE | Admit: 2020-06-03 | Discharge: 2020-06-03 | Disposition: A | Discharge status: Home / Self Care | Payer: Medicare Other | Source: Ambulatory Visit | Attending: Physician Assistant | Admitting: Physician Assistant

## 2020-06-03 ENCOUNTER — Other Ambulatory Visit: Payer: Self-pay | Admitting: Physician Assistant

## 2020-06-03 DIAGNOSIS — R1031 Right lower quadrant pain: Secondary | ICD-10-CM

## 2020-06-06 ENCOUNTER — Other Ambulatory Visit: Payer: Self-pay

## 2020-06-06 ENCOUNTER — Emergency Department (HOSPITAL_BASED_OUTPATIENT_CLINIC_OR_DEPARTMENT_OTHER)
Admission: EM | Admit: 2020-06-06 | Discharge: 2020-06-07 | Disposition: A | Discharge status: Home / Self Care | Payer: Medicare Other | Attending: Emergency Medicine | Admitting: Emergency Medicine

## 2020-06-06 ENCOUNTER — Emergency Department (HOSPITAL_BASED_OUTPATIENT_CLINIC_OR_DEPARTMENT_OTHER): Payer: Medicare Other

## 2020-06-06 ENCOUNTER — Encounter (HOSPITAL_BASED_OUTPATIENT_CLINIC_OR_DEPARTMENT_OTHER): Payer: Self-pay | Admitting: Emergency Medicine

## 2020-06-06 DIAGNOSIS — I110 Hypertensive heart disease with heart failure: Secondary | ICD-10-CM | POA: Diagnosis not present

## 2020-06-06 DIAGNOSIS — R079 Chest pain, unspecified: Secondary | ICD-10-CM | POA: Diagnosis present

## 2020-06-06 DIAGNOSIS — I5033 Acute on chronic diastolic (congestive) heart failure: Secondary | ICD-10-CM | POA: Insufficient documentation

## 2020-06-06 DIAGNOSIS — Z79899 Other long term (current) drug therapy: Secondary | ICD-10-CM | POA: Insufficient documentation

## 2020-06-06 DIAGNOSIS — R1013 Epigastric pain: Secondary | ICD-10-CM

## 2020-06-06 DIAGNOSIS — R0789 Other chest pain: Secondary | ICD-10-CM | POA: Diagnosis not present

## 2020-06-06 DIAGNOSIS — E119 Type 2 diabetes mellitus without complications: Secondary | ICD-10-CM | POA: Diagnosis not present

## 2020-06-06 LAB — BASIC METABOLIC PANEL
Anion gap: 10 (ref 5–15)
BUN: 12 mg/dL (ref 8–23)
CO2: 29 mmol/L (ref 22–32)
Calcium: 9.2 mg/dL (ref 8.9–10.3)
Chloride: 100 mmol/L (ref 98–111)
Creatinine, Ser: 1.15 mg/dL — ABNORMAL HIGH (ref 0.44–1.00)
GFR, Estimated: 53 mL/min — ABNORMAL LOW (ref >60)
Glucose, Bld: 177 mg/dL — ABNORMAL HIGH (ref 70–99)
Potassium: 3.4 mmol/L — ABNORMAL LOW (ref 3.5–5.1)
Sodium: 139 mmol/L (ref 135–145)

## 2020-06-06 LAB — CBC
HCT: 42.7 % (ref 36.0–46.0)
Hemoglobin: 14 g/dL (ref 12.0–15.0)
MCH: 31.2 pg (ref 26.0–34.0)
MCHC: 32.8 g/dL (ref 30.0–36.0)
MCV: 95.1 fL (ref 80.0–100.0)
Platelets: 410 10*3/uL — ABNORMAL HIGH (ref 150–400)
RBC: 4.49 MIL/uL (ref 3.87–5.11)
RDW: 13.1 % (ref 11.5–15.5)
WBC: 8.9 10*3/uL (ref 4.0–10.5)
nRBC: 0 % (ref 0.0–0.2)

## 2020-06-06 LAB — TROPONIN I (HIGH SENSITIVITY): Troponin I (High Sensitivity): 3 ng/L (ref <18)

## 2020-06-06 MED ORDER — LIDOCAINE VISCOUS HCL 2 % MT SOLN
15.0000 mL | Freq: Once | OROMUCOSAL | Status: AC
  Administered 2020-06-06: 15 mL via ORAL
  Filled 2020-06-06: qty 15

## 2020-06-06 MED ORDER — ALUM & MAG HYDROXIDE-SIMETH 200-200-20 MG/5ML PO SUSP
30.0000 mL | Freq: Once | ORAL | Status: AC
  Administered 2020-06-06: 30 mL via ORAL
  Filled 2020-06-06: qty 30

## 2020-06-06 NOTE — ED Notes (Signed)
Patient transported to X-ray 

## 2020-06-06 NOTE — ED Triage Notes (Signed)
Reports midsternal chest pain describes as pressure and throbbing that started two days ago.  Also endorses some nausea.

## 2020-06-07 LAB — TROPONIN I (HIGH SENSITIVITY): Troponin I (High Sensitivity): 3 ng/L (ref <18)

## 2020-06-07 LAB — HEPATIC FUNCTION PANEL
ALT: 44 U/L (ref 0–44)
AST: 55 U/L — ABNORMAL HIGH (ref 15–41)
Albumin: 4 g/dL (ref 3.5–5.0)
Alkaline Phosphatase: 81 U/L (ref 38–126)
Bilirubin, Direct: <0.1 mg/dL (ref 0.0–0.2)
Total Bilirubin: 0.3 mg/dL (ref 0.3–1.2)
Total Protein: 7.4 g/dL (ref 6.5–8.1)

## 2020-06-07 LAB — LIPASE, BLOOD: Lipase: 30 U/L (ref 11–51)

## 2020-06-07 MED ORDER — SUCRALFATE 1 G PO TABS: 1.0000 g | ORAL_TABLET | Freq: Three times a day (TID) | ORAL | 0 refills | Status: DC

## 2020-06-07 MED ORDER — OMEPRAZOLE 20 MG PO CPDR: 20.0000 mg | DELAYED_RELEASE_CAPSULE | Freq: Every day | ORAL | 0 refills | Status: DC

## 2020-06-07 NOTE — ED Provider Notes (Signed)
[facility] Provider Note   CSN: [id number] Arrival date & time: 06/06/20  2209     History Chief Complaint  Patient presents with  . Chest Pain    [name] is a 64 y.o. female.  HPI     This is a 64 year old female with a history of diverticulitis, diabetes, hypertension, hyperlipidemia who presents with upper abdominal and chest pain.  Patient reports 2 to 3-day history of worsening epigastric and lower chest pain.  She reports that she is having difficulty sleeping.  It is burning sharp in nature and nonradiating.  She recently was started on Cipro and Flagyl on Thursday for diverticulitis.  She states that since taking the antibiotics she has had worsening symptoms.  She reports that she feels that she is drinking enough water with these antibiotics.  Has not noted any specific relationship of her pain to food but it is definitely worse at night and laying flat.  No exertional components.  She rates her pain at 6 out of 10.  Reports nausea without vomiting.  No diarrhea.  She has tried Carafate but has run out.  Past Medical History:  Diagnosis Date  . Complication of anesthesia    " I have to be awake to be intubated because I have a mass in my throut "  . Diabetes mellitus   . Difficult intubation    mass in throut  . Diverticulitis   . Hyperlipidemia   . Hypertension   . Migraines 2013  . Sleep apnea    CPAP    Patient Active Problem List   Diagnosis Date Noted  . Leg swelling 06/30/2019  . SOB (shortness of breath) 06/30/2019  . Educated about COVID-19 virus infection 06/30/2019  . AKI (acute kidney injury) ([place]) 03/20/2016  . DM type 2, goal HbA1c < 7% (HCC) 03/20/2016  . Chronic diastolic CHF (congestive heart failure) ([place]) 03/20/2016  . Sigmoid diverticulitis 03/20/2016  . Diverticulitis 03/20/2016  . Leukocytosis 03/19/2016  . Chest pain 08/31/2013  . CHEST PAIN-UNSPECIFIED 08/04/2009  . THYROID NODULE, LEFT  07/30/2009  . Diabetes mellitus ([place]) 07/30/2009  . HYPERLIPIDEMIA 07/30/2009  . Obstructive sleep apnea 07/30/2009  . Essential hypertension 07/30/2009  . GERD 07/30/2009  . Irritable bowel syndrome 07/30/2009    Past Surgical History:  Procedure Laterality Date  . ABDOMINAL HYSTERECTOMY    . GALLBLADDER SURGERY    . KNEE ARTHROSCOPY    . lymph nodectomy    . NASAL SINUS SURGERY    . SPLENIC ARTERY EMBOLIZATION     due to Aneurysm  . TONSILLECTOMY       OB History   No obstetric history on file.     Family History  Problem Relation Age of Onset  . Diabetes Father   . Hypertension Father   . CAD Daughter 29       CABG X 3    Social History   Tobacco Use  . Smoking status: Never Smoker  . Smokeless tobacco: Never Used  Substance Use Topics  . Alcohol use: No  . Drug use: No    Home Medications Prior to Admission medications   Medication Sig Start Date End Date Taking? Authorizing Provider  acetaminophen (TYLENOL) 500 MG tablet Take 500 mg by mouth every 6 (six) hours as needed (for pain).    [provider]  albuterol (VENTOLIN HFA) 108 (90 BASE) MCG/ACT inhaler Inhale 2 puffs into the lungs every 6 (six) hours as needed for  wheezing or shortness of breath.  02/08/12   [provider]  azelastine (ASTELIN) 0.1 % nasal spray Place 2 sprays into both nostrils 2 (two) times daily. 02/01/20   Tasia Catchings, Amy V, PA-C  clonazePAM (KLONOPIN) 0.5 MG tablet Take 0.5 mg by mouth at bedtime. For sleep 08/16/13   [provider]  CRESTOR 20 MG tablet Take 10 mg by mouth daily. 08/07/13   [provider]  diltiazem (DILT-XR) 180 MG 24 hr capsule DILT-XR 180 mg capsule, extended release  TAKE 1 CAPSULE BY MOUTH EVERY DAY    [provider]  diphenhydrAMINE (BENADRYL) 50 MG tablet Take 1 tablet (50 mg total) by mouth once for 1 dose. 11/22/18 11/22/18  Minus Breeding, MD  EPINEPHrine (EPIPEN 2-PAK) 0.3 mg/0.3 mL IJ SOAJ injection Inject 0.3 mLs as  directed as needed (for an anaphylactic reaction).  02/14/13   [provider]  esomeprazole (NEXIUM) 40 MG capsule Take 40 mg by mouth daily.  02/07/12   [provider]  Eyelid Cleansers (AVENOVA) 0.01 % SOLN Place 1 application into the right eye daily.  08/16/17   [provider]  famotidine (PEPCID) 40 MG tablet famotidine 40 mg tablet  TAKE 1 TABLET BY MOUTH EVERYDAY AT BEDTIME 09/25/18   [provider]  fluticasone (FLONASE) 50 MCG/ACT nasal spray Place 1 spray into both nostrils daily as needed for allergies or rhinitis.     [provider]  Fluticasone-Salmeterol (ADVAIR DISKUS) 500-50 MCG/DOSE AEPB Inhale 1 puff into the lungs daily as needed (for shortness of breath).  02/08/12   [provider]  furosemide (LASIX) 40 MG tablet Take 40 mg by mouth daily.  08/07/13   [provider]  methocarbamol (ROBAXIN) 500 MG tablet Take 500 mg by mouth 3 (three) times daily as needed for muscle spasms.  06/05/13   [provider]  metoprolol tartrate (LOPRESSOR) 100 MG tablet Take 1 tablet (100 mg total) by mouth once for 1 dose. 11/22/18 11/22/18  Minus Breeding, MD  montelukast (SINGULAIR) 10 MG tablet Take 10 mg by mouth at bedtime.  02/21/15   [provider]  Multiple Vitamin (MULTIVITAMIN WITH MINERALS) TABS tablet Take 1 tablet by mouth daily.    [provider]  omeprazole (PRILOSEC) 20 MG capsule Take 1 capsule (20 mg total) by mouth daily. 06/07/20   Enolia Koepke, Barbette Hair, MD  ondansetron (ZOFRAN) 4 MG tablet Take 1 tablet (4 mg total) by mouth every 6 (six) hours as needed for nausea. 03/24/16   Theodis Blaze, MD  predniSONE (DELTASONE) 50 MG tablet Take 1 tablet 13 hour prior to procedure, take another 7 hour prior to procedure and last tablet 1 hour prior to procedure 11/22/18   Minus Breeding, MD  predniSONE (DELTASONE) 50 MG tablet Take 1 tablet (50 mg total) by mouth daily with breakfast. 02/01/20   Tasia Catchings, Amy V,  PA-C  quinapril (ACCUPRIL) 40 MG tablet Take 40 mg by mouth daily.  08/07/13   [provider]  RESTASIS 0.05 % ophthalmic emulsion Place 1 drop into both eyes 2 (two) times daily. 08/23/17   [provider]  saccharomyces boulardii (FLORASTOR) 250 MG capsule Take 1 capsule (250 mg total) by mouth 2 (two) times daily. Patient taking differently: Take 250 mg by mouth daily.  03/24/16   Theodis Blaze, MD    Allergies    Aspirin, Bee venom, Demerol [meperidine], Ibuprofen, Meperidine hcl, Nsaids, Iohexol, Clindamycin/lincomycin, Codeine, Ivp dye [iodinated diagnostic agents], Mupirocin, Neosporin [  neomycin-bacitracin zn-polymyx], Pollen extract, Sulfonamide derivatives, Tramadol, Cephalosporins, and Penicillins  Review of Systems   Review of Systems  Constitutional: Negative for fever.  Respiratory: Negative for shortness of breath.   Cardiovascular: Negative for chest pain.  Gastrointestinal: Positive for abdominal pain and nausea. Negative for diarrhea and vomiting.  Genitourinary: Negative for dysuria.  All other systems reviewed and are negative.   Physical Exam Updated Vital Signs BP 137/82   Pulse 65   Temp 97.6 F (36.4 C) (Oral)   Resp 17   Ht 1.753 m (5\' 9" )   Wt 97.1 kg   SpO2 96%   BMI 31.60 kg/m   Physical Exam Vitals and nursing note reviewed.  Constitutional:      Appearance: She is well-developed and well-nourished. She is not ill-appearing.  HENT:     Head: Normocephalic and atraumatic.  Eyes:     Pupils: Pupils are equal, round, and reactive to light.  Cardiovascular:     Rate and Rhythm: Normal rate and regular rhythm.     Heart sounds: Normal heart sounds.  Pulmonary:     Effort: Pulmonary effort is normal. No respiratory distress.     Breath sounds: No wheezing.  Abdominal:     General: Bowel sounds are normal.     Palpations: Abdomen is soft.     Tenderness: There is abdominal tenderness. There is no guarding or rebound.      Comments: Epigastric tenderness to palpation, no rebound or guarding  Musculoskeletal:     Cervical back: Neck supple.  Skin:    General: Skin is warm and dry.  Neurological:     Mental Status: She is alert and oriented to person, place, and time.  Psychiatric:        Mood and Affect: Mood and affect and mood normal.     ED Results / Procedures / Treatments   Labs (all labs ordered are listed, but only abnormal results are displayed) Labs Reviewed  BASIC METABOLIC PANEL - Abnormal; Notable for the following components:      Result Value   Potassium 3.4 (*)    Glucose, Bld 177 (*)    Creatinine, Ser 1.15 (*)    GFR, Estimated 53 (*)    All other components within normal limits  CBC - Abnormal; Notable for the following components:   Platelets 410 (*)    All other components within normal limits  HEPATIC FUNCTION PANEL - Abnormal; Notable for the following components:   AST 55 (*)    All other components within normal limits  LIPASE, BLOOD  TROPONIN I (HIGH SENSITIVITY)  TROPONIN I (HIGH SENSITIVITY)    EKG None  Radiology DG Chest 2 View  Result Date: 06/07/2020 CLINICAL DATA:  Chest pain. EXAM: CHEST - 2 VIEW COMPARISON:  03/06/2018 FINDINGS: The cardiomediastinal silhouette is unchanged and within normal limits. The lungs are well inflated. No airspace consolidation, edema, pleural effusion, or pneumothorax is identified. Mild prominence of the interstitial markings in the lung bases is unchanged. Surgical clips are noted in the left upper abdomen. No acute osseous abnormality is identified. IMPRESSION: No active cardiopulmonary disease. Electronically Signed   By: Logan Bores M.D.   On: 06/07/2020 00:22    Procedures Procedures (including critical care time)  Medications Ordered in ED Medications  alum & mag hydroxide-simeth (MAALOX/MYLANTA) 200-200-20 MG/5ML suspension 30 mL (30 mLs Oral Given 06/06/20 2342)    And  lidocaine (XYLOCAINE) 2 % viscous mouth  solution 15 mL (15 mLs Oral  Given 06/06/20 2342)    ED Course  I have reviewed the triage vital signs and the nursing notes.  Pertinent labs & imaging results that were available during my care of the patient were reviewed by me and considered in my medical decision making (see chart for details).    MDM Rules/Calculators/A&P                          Patient presents with left lower chest and upper abdominal pain.  She is overall nontoxic and vital signs are reassuring.  EKG shows no evidence of acute ischemia or arrhythmia.  Pain is very atypical for ACS.  Highly suspect GI etiology.  Potentially gastritis versus pill esophagitis given recent antibiotic therapy.  Labs obtained.  Labs are largely reassuring.  No significant metabolic derangements.  Lipase is normal which suggest no evidence of pancreatitis.  No significant leukocytosis.  Otherwise labs are at baseline.  Chest x-ray shows no evidence of pneumothorax or pneumonia.  Troponin x2 -.  Doubt ACS.  Patient significantly improved with GI cocktail.  Her pain is epigastric in nature and doubt acute complication such as perforation related to diverticulitis.  Unfortunately, she has a penicillin allergy which makes it difficult to change her antibiotics for her ongoing diverticulitis.  I discussed with patient that she needs to make sure that she has food on her stomach and drinks plenty of water with her antibiotics.  Will start on omeprazole.  Patient stated understanding  After history, exam, and medical workup I feel the patient has been appropriately medically screened and is safe for discharge home. Pertinent diagnoses were discussed with the patient. Patient was given return precautions.  Final Clinical Impression(s) / ED Diagnoses Final diagnoses:  Atypical chest pain  Epigastric pain    Rx / DC Orders ED Discharge Orders         Ordered    omeprazole (PRILOSEC) 20 MG capsule  Daily        06/07/20 0306           Merryl Hacker, MD 06/07/20 [id number]

## 2020-06-07 NOTE — Discharge Instructions (Addendum)
You were seen today for chest pain and upper abdominal pain.  This may be related to current antibiotic use.  Start Prilosec daily.  Unfortunately given your allergies to other antibiotics, you are on the best regimen for diverticulitis.  Make sure that you are taking your medications with plenty of food and drink.  Follow-up with your primary physician.

## 2020-07-26 IMAGING — CT CT HEAD W/O CM
4 series · 17 of 47 positions shown, 19 images · non-contrast
Comparison: 08/27/2010 head CT.

CLINICAL DATA: Episodes of blurry vision for several days. No
reported injury.

EXAM:
CT HEAD WITHOUT CONTRAST
TECHNIQUE: Contiguous axial images were obtained from the base of the skull
through the vertex without intravenous contrast.

[Series 3: head without · axial · non-contrast · 0.47mm/px · z∈[-84,+41]mm · 7 of 35 slices shown, 9 images]
[im 5/35  brain]
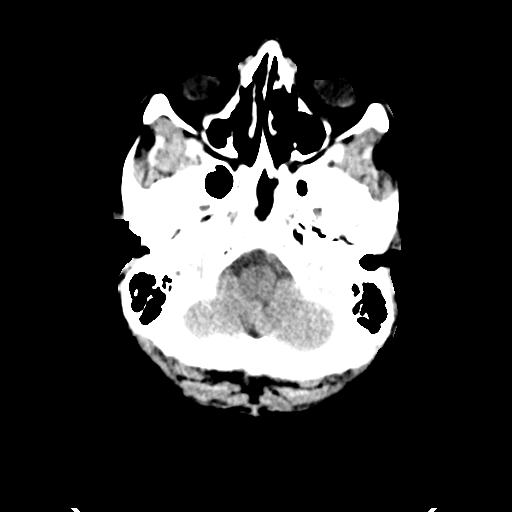
[im 5/35  bone]
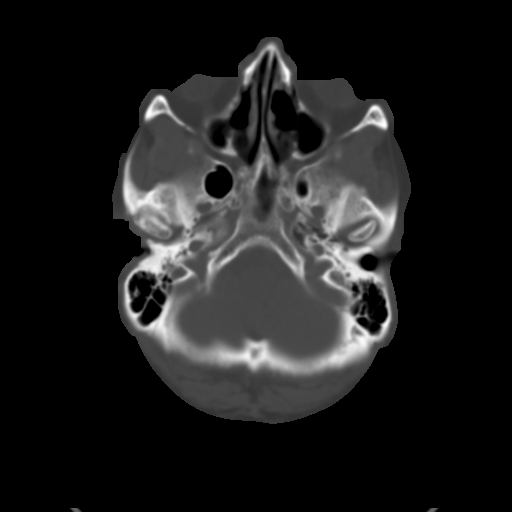
[im 9/35  brain]
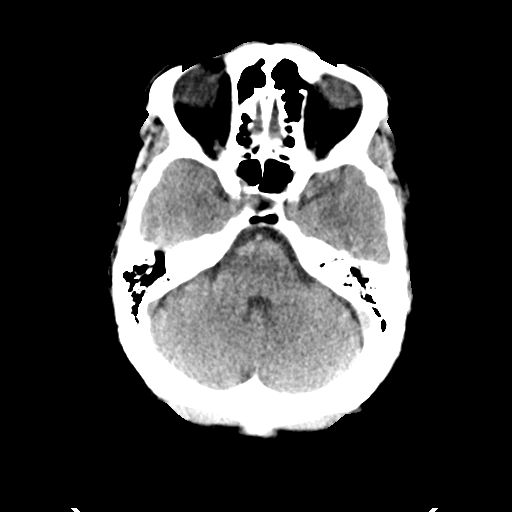
[im 13/35  brain]
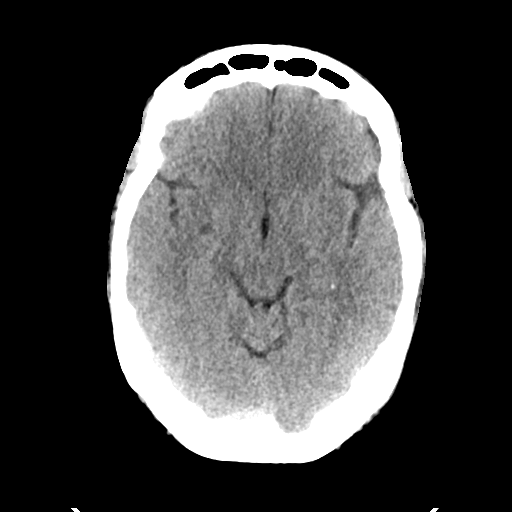
[im 18/35  brain]
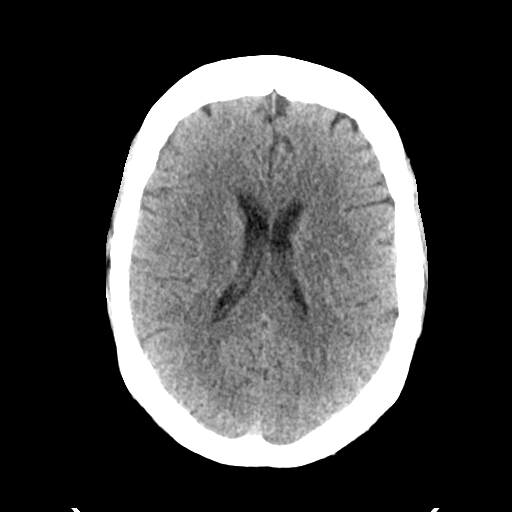
[im 22/35  brain]
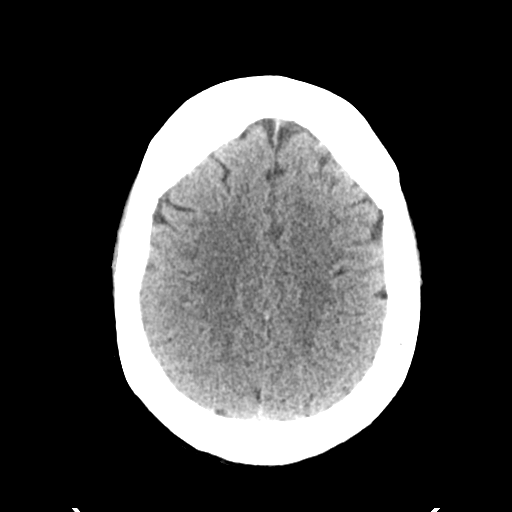
[im 22/35  bone]
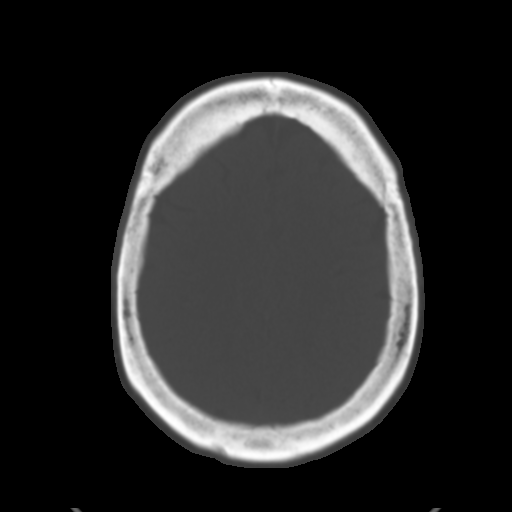
[im 26/35  brain]
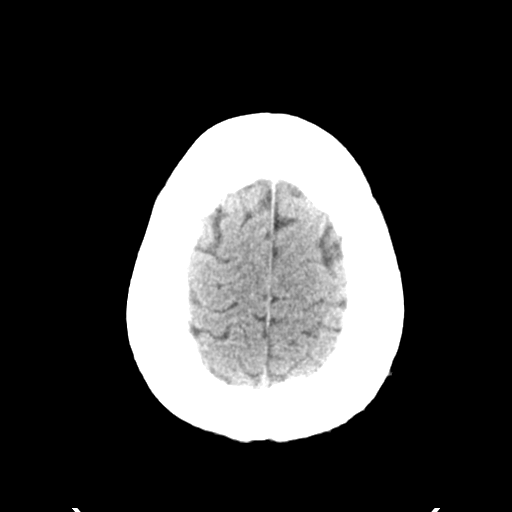
[im 30/35  brain]
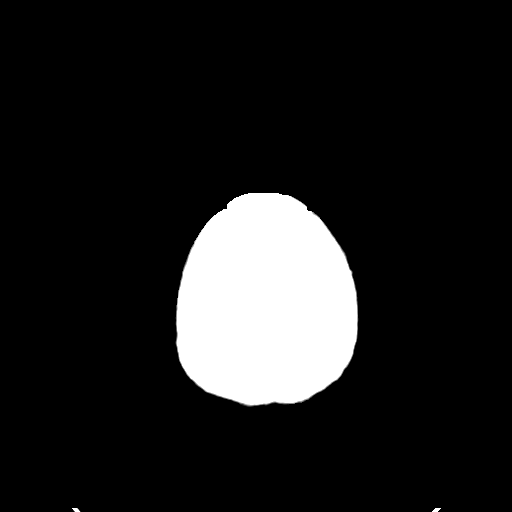

[Series 4: head bone · axial · 0.47mm/px · z∈[-88,-28]mm · 4 of 87 slices shown]
[im 9/87  bone]
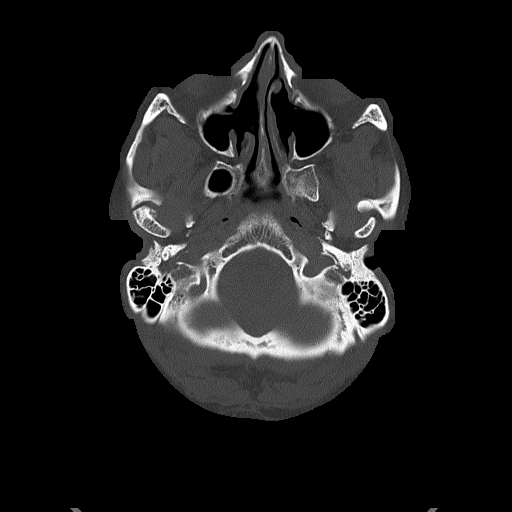
[im 18/87  bone]
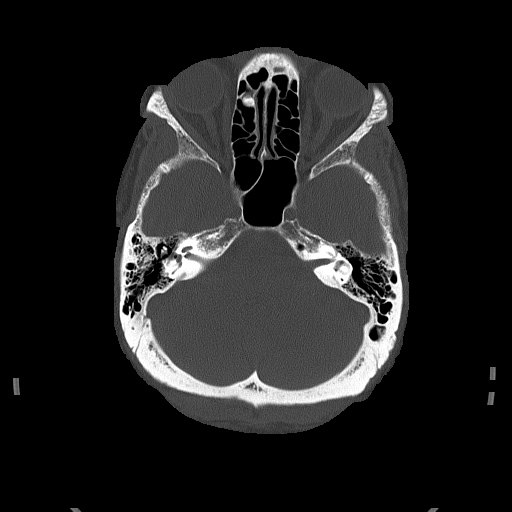
[im 26/87  bone]
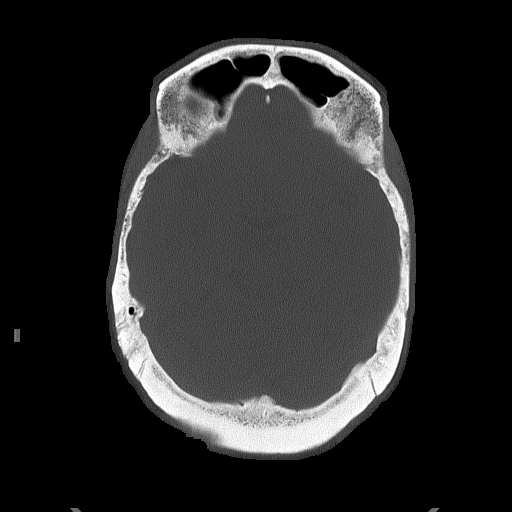
[im 39/87  bone]
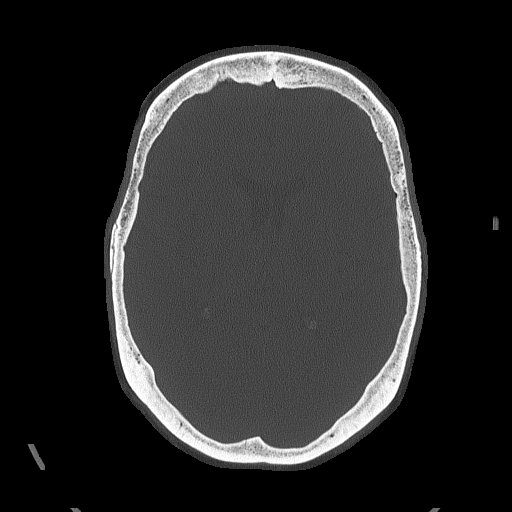

[Series 5: head without cor · coronal · non-contrast · 0.38mm/px · 3 of 70 slices shown]
[im 24/70  brain]
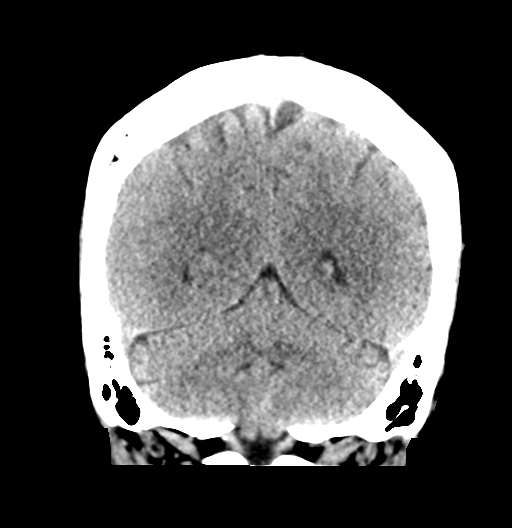
[im 31/70  brain]
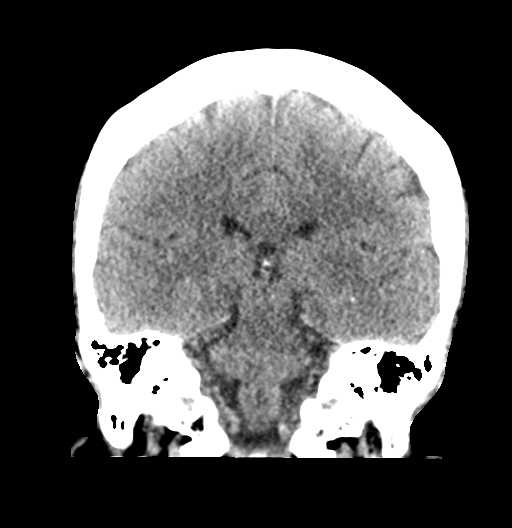
[im 39/70  brain]
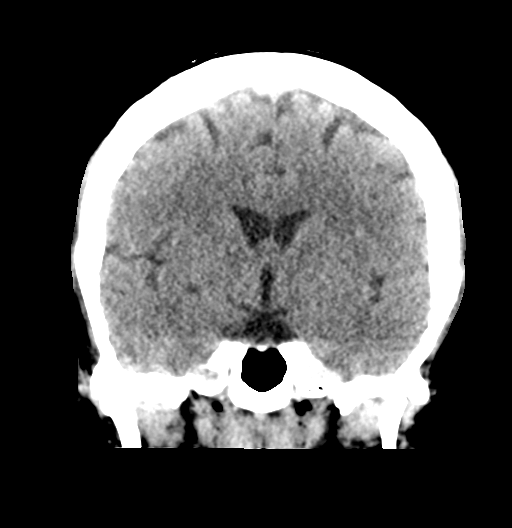

[Series 6: head without sag · sagittal · non-contrast · 0.39mm/px · 3 of 65 slices shown]
[im 22/65  brain]
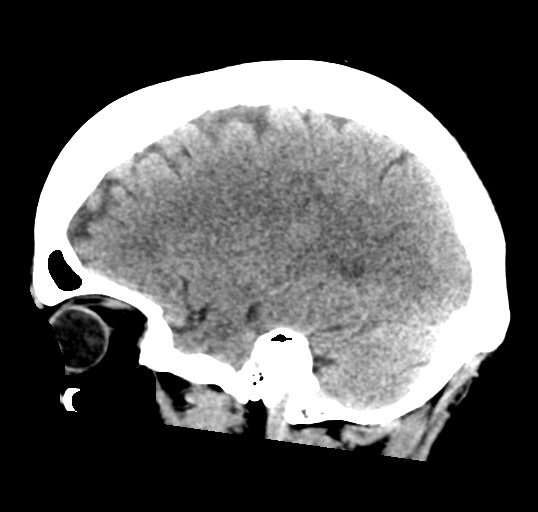
[im 33/65  brain]
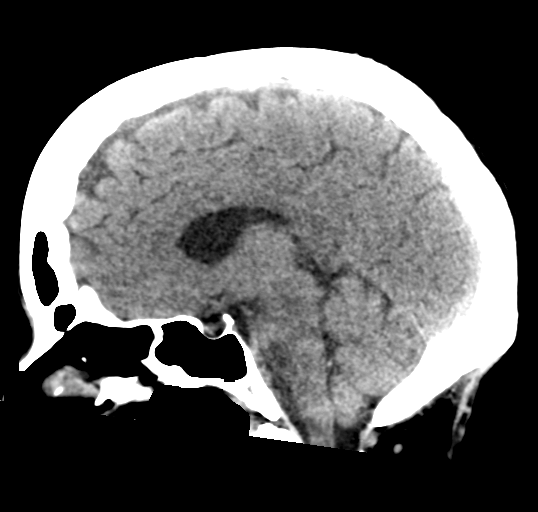
[im 43/65  brain]
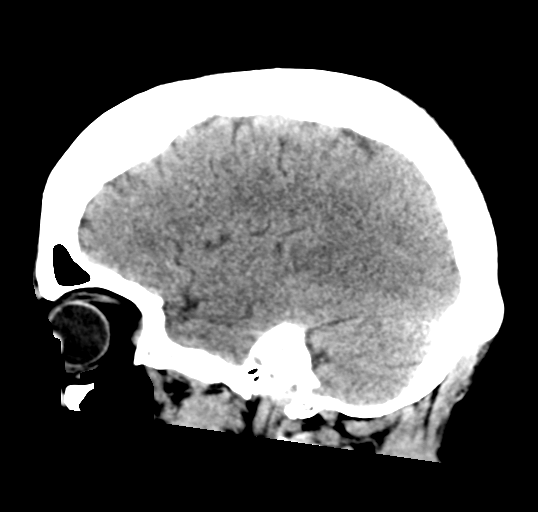

[17 of 47 positions shown; findings below may reference images not displayed]

FINDINGS: Brain: No evidence of parenchymal hemorrhage or extra-axial fluid
collection. No mass lesion, mass effect, or midline shift. No CT
evidence of acute infarction. Cerebral volume is age appropriate. No
ventriculomegaly.

Vascular: No acute abnormality.

Skull: No evidence of calvarial fracture.

Sinuses/Orbits: The visualized paranasal sinuses are essentially
clear.

Other:  The mastoid air cells are unopacified.
IMPRESSION: Negative head CT.  No evidence of acute intracranial abnormality.

## 2020-08-28 ENCOUNTER — Other Ambulatory Visit: Payer: Self-pay | Admitting: Physician Assistant

## 2020-08-28 DIAGNOSIS — R1319 Other dysphagia: Secondary | ICD-10-CM

## 2020-09-02 ENCOUNTER — Ambulatory Visit
Admission: RE | Admit: 2020-09-02 | Discharge: 2020-09-02 | Disposition: A | Discharge status: Home / Self Care | Payer: Medicare Other | Source: Ambulatory Visit | Attending: Physician Assistant | Admitting: Physician Assistant

## 2020-09-02 DIAGNOSIS — R1319 Other dysphagia: Secondary | ICD-10-CM

## 2020-10-19 ENCOUNTER — Other Ambulatory Visit: Payer: Self-pay | Admitting: Internal Medicine

## 2020-10-19 DIAGNOSIS — Z1231 Encounter for screening mammogram for malignant neoplasm of breast: Secondary | ICD-10-CM

## 2020-12-11 ENCOUNTER — Ambulatory Visit
Admission: RE | Admit: 2020-12-11 | Discharge: 2020-12-11 | Disposition: A | Discharge status: Home / Self Care | Payer: Medicare Other | Source: Ambulatory Visit | Attending: Internal Medicine | Admitting: Internal Medicine

## 2020-12-11 ENCOUNTER — Other Ambulatory Visit: Payer: Self-pay

## 2020-12-11 DIAGNOSIS — Z1231 Encounter for screening mammogram for malignant neoplasm of breast: Secondary | ICD-10-CM

## 2021-04-25 DIAGNOSIS — R002 Palpitations: Secondary | ICD-10-CM | POA: Insufficient documentation

## 2021-04-25 NOTE — Progress Notes (Signed)
Cardiology Office Note   Date:  04/26/2021   ID:  [name], DOB Feb 04, 1956, MRN [id number]  PCP:  [name], MD  Cardiologist:   None   Chief Complaint  Patient presents with   Palpitations      History of Present Illness: [name] is a 65 y.o. female who is referred by [name], MD for evaluation of palpitations and arm pain.      I saw see her in March 2015 when she was briefly hospitalized  For evaluation of chest pain. This was atypical. Follow-up stress testing was unremarkable for any evidence of ischemia.   I saw her in 2016 preop prior to tonsillectomy.  Se also had an echocardiogram in 2017 and there were no significant abnormalities. I saw her last year for palpitations.    She presents again for evaluation of palpitations.  These have been going on for about 4 weeks.  She has a lot going on.  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  Her husband has had bilateral amputations.  The daughter with diabetes has had a stroke myocardial infarction.  She is retired twice from [employer].    She says the palpitations are happening mostly at night.  She feels several skips neuro and then it will settle down.  She feels a little lightheaded.  She does not have any syncope.  He has some chest discomfort when this is going on but not otherwise.  She is not had any weight gain or edema.   Past Medical History:  Diagnosis Date   Complication of anesthesia    " I have to be awake to be intubated because I have a mass in my throut "   Diabetes mellitus    Difficult intubation    mass in throut   Diverticulitis    Hyperlipidemia    Hypertension    Migraines 2013   Sleep apnea    CPAP    Past Surgical History:  Procedure Laterality Date   ABDOMINAL HYSTERECTOMY     GALLBLADDER SURGERY     KNEE ARTHROSCOPY     lymph nodectomy     NASAL SINUS SURGERY     SPLENIC ARTERY EMBOLIZATION     due to Aneurysm   TONSILLECTOMY       Current  Outpatient Medications  Medication Sig Dispense Refill   acetaminophen (TYLENOL) 500 MG tablet Take 500 mg by mouth every 6 (six) hours as needed (for pain).     albuterol (VENTOLIN HFA) 108 (90 Base) MCG/ACT inhaler Inhale 2 puffs into the lungs every 6 (six) hours as needed for wheezing or shortness of breath.      azelastine (ASTELIN) 0.1 % nasal spray Place 2 sprays into both nostrils 2 (two) times daily. 30 mL 0   clonazePAM (KLONOPIN) 0.5 MG tablet Take 0.5 mg by mouth at bedtime. For sleep     CRESTOR 20 MG tablet Take 10 mg by mouth daily.     diltiazem (DILACOR XR) 180 MG 24 hr capsule DILT-XR 180 mg capsule, extended release  TAKE 1 CAPSULE BY MOUTH EVERY DAY     diphenhydrAMINE (BENADRYL) 50 MG tablet Take 1 tablet (50 mg total) by mouth once for 1 dose. 1 tablet 0   EPINEPHrine 0.3 mg/0.3 mL IJ SOAJ injection Inject 0.3 mLs as directed as needed (for an anaphylactic reaction).      Eyelid Cleansers (AVENOVA) 0.01 % SOLN Place 1 application into  the right eye daily.      fluticasone (FLONASE) 50 MCG/ACT nasal spray Place 1 spray into both nostrils daily as needed for allergies or rhinitis.      Fluticasone-Salmeterol (ADVAIR) 500-50 MCG/DOSE AEPB Inhale 1 puff into the lungs daily as needed (for shortness of breath).      furosemide (LASIX) 40 MG tablet Take 40 mg by mouth daily.      metoprolol tartrate (LOPRESSOR) 25 MG tablet Take 1 tablet (25 mg total) by mouth daily. 30 tablet 3   montelukast (SINGULAIR) 10 MG tablet Take 10 mg by mouth at bedtime.      Multiple Vitamin (MULTIVITAMIN WITH MINERALS) TABS tablet Take 1 tablet by mouth daily.     omeprazole (PRILOSEC) 20 MG capsule Take 1 capsule (20 mg total) by mouth daily. 30 capsule 0   ondansetron (ZOFRAN) 4 MG tablet Take 1 tablet (4 mg total) by mouth every 6 (six) hours as needed for nausea. 20 tablet 0   quinapril (ACCUPRIL) 40 MG tablet Take 40 mg by mouth daily.      RESTASIS 0.05 % ophthalmic emulsion Place 1 drop into  both eyes 2 (two) times daily.  3   saccharomyces boulardii (FLORASTOR) 250 MG capsule Take 1 capsule (250 mg total) by mouth 2 (two) times daily. (Patient taking differently: Take 250 mg by mouth daily.) 60 capsule 0   esomeprazole (NEXIUM) 40 MG capsule Take 40 mg by mouth daily.  (Patient not taking: Reported on 04/26/2021)     famotidine (PEPCID) 40 MG tablet famotidine 40 mg tablet  TAKE 1 TABLET BY MOUTH EVERYDAY AT BEDTIME (Patient not taking: Reported on 04/26/2021)     methocarbamol (ROBAXIN) 500 MG tablet Take 500 mg by mouth 3 (three) times daily as needed for muscle spasms.  (Patient not taking: Reported on 04/26/2021)     predniSONE (DELTASONE) 50 MG tablet Take 1 tablet 13 hour prior to procedure, take another 7 hour prior to procedure and last tablet 1 hour prior to procedure (Patient not taking: Reported on 04/26/2021) 3 tablet 0   predniSONE (DELTASONE) 50 MG tablet Take 1 tablet (50 mg total) by mouth daily with breakfast. (Patient not taking: Reported on 04/26/2021) 5 tablet 0   sucralfate (CARAFATE) 1 g tablet Take 1 tablet (1 g total) by mouth 4 (four) times daily -  with meals and at bedtime. (Patient not taking: Reported on 04/26/2021) 60 tablet 0   No current facility-administered medications for this visit.    Allergies:   Aspirin, Bee venom, Demerol [meperidine], Ibuprofen, Meperidine hcl, Nsaids, Iohexol, Clindamycin/lincomycin, Codeine, Ivp dye [iodinated diagnostic agents], Mupirocin, Neosporin [neomycin-bacitracin zn-polymyx], Pollen extract, Sulfonamide derivatives, Tramadol, Cephalosporins, and Penicillins    ROS:  Please see the history of present illness.   Otherwise, review of systems are positive for none.   All other systems are reviewed and negative.    PHYSICAL EXAM: VS:  BP (!) 142/80   Pulse 77   Ht 5' 8.5" (1.74 m)   Wt 222 lb 12.8 oz (101.1 kg)   SpO2 98%   BMI 33.38 kg/m  , BMI Body mass index is 33.38 kg/m. GENERAL:  Well appearing NECK:  No  jugular venous distention, waveform within normal limits, carotid upstroke brisk and symmetric, no bruits, no thyromegaly LUNGS:  Clear to auscultation bilaterally CHEST:  Unremarkable HEART:  PMI not displaced or sustained,S1 and S2 within normal limits, no S3, no S4, no clicks, no rubs, no murmurs ABD:  Flat, positive  bowel sounds normal in frequency in pitch, no bruits, no rebound, no guarding, no midline pulsatile mass, no hepatomegaly, no splenomegaly EXT:  2 plus pulses throughout, no edema, no cyanosis no clubbing   EKG:  EKG is  ordered today. Sinus rhythm, rate 77, axis within normal limits, intervals within normal limits, no acute ST-T wave changes.  Recent Labs: 06/06/2020: ALT 44; BUN 12; Creatinine, Ser 1.15; Hemoglobin 14.0; Platelets 410; Potassium 3.4; Sodium 139    Lipid Panel    Wt Readings from Last 3 Encounters:  04/26/21 222 lb 12.8 oz (101.1 kg)  06/06/20 214 lb (97.1 kg)  07/01/19 226 lb 9.6 oz (102.8 kg)      Other studies Reviewed: Additional studies/ records that were reviewed today include: Labs Review of the above records demonstrates:  Please see elsewhere in the .     ASSESSMENT AND PLAN:  PALPITATIONS:   I am going to treat her presumptively without any further testing.  I will give her metoprolol to tartrate 25 mg to take daily and instructions to increase this to twice daily if she is having problems.  We will check a TSH.  Otherwise she will let me know.   HTN:   BP is slightly elevated.  However, she has not taken her medicines.  She will keep an eye on this.   CHEST PAIN:   She does get the chest discomfort but this really is not much different than when she had a stress test in 2020.  No change in therapy.   EDEMA:    This is controlled.  No change in therapy.  She does have some but it is mild.    Current medicines are reviewed at length with the patient today.  The patient does not have concerns regarding medicines.  The following  changes have been made: As above  Labs/ tests ordered today include:    Orders Placed This Encounter  Procedures   TSH   EKG 12-Lead     Disposition:   FU with APP in 6 months   Signed, Minus Breeding, MD  04/26/2021 8:24 AM    [place]

## 2021-04-26 ENCOUNTER — Other Ambulatory Visit: Payer: Self-pay

## 2021-04-26 ENCOUNTER — Ambulatory Visit (INDEPENDENT_AMBULATORY_CARE_PROVIDER_SITE_OTHER): Payer: Medicare Other | Admitting: Cardiology

## 2021-04-26 ENCOUNTER — Encounter: Payer: Self-pay | Admitting: Cardiology

## 2021-04-26 VITALS — BP 142/80 | HR 77 | Ht 68.5 in | Wt 222.8 lb

## 2021-04-26 DIAGNOSIS — R0609 Other forms of dyspnea: Secondary | ICD-10-CM

## 2021-04-26 DIAGNOSIS — R002 Palpitations: Secondary | ICD-10-CM

## 2021-04-26 DIAGNOSIS — R079 Chest pain, unspecified: Secondary | ICD-10-CM | POA: Diagnosis not present

## 2021-04-26 DIAGNOSIS — I10 Essential (primary) hypertension: Secondary | ICD-10-CM | POA: Diagnosis not present

## 2021-04-26 DIAGNOSIS — M7989 Other specified soft tissue disorders: Secondary | ICD-10-CM

## 2021-04-26 DIAGNOSIS — Z79899 Other long term (current) drug therapy: Secondary | ICD-10-CM

## 2021-04-26 LAB — TSH: TSH: 1.17 u[IU]/mL (ref 0.450–4.500)

## 2021-04-26 MED ORDER — METOPROLOL TARTRATE 25 MG PO TABS: 25.0000 mg | ORAL_TABLET | Freq: Every day | ORAL | 3 refills | Status: DC

## 2021-04-26 NOTE — Patient Instructions (Signed)
Medication Instructions:  Start metoprolol tart 25mg   *If you need a refill on your cardiac medications before your next appointment, please call your pharmacy*  Lab Work:   Testing/Procedures:  TSH TODAY   NONE  Special Instructions NONE  Follow-Up: Your next appointment:  6 month(s) In Person with Coletta Memos, FNP, Fabian Sharp, PA-C, Sande Rives, PA-C, Caron Presume, PA-C, Jory Sims, DNP, ANP, or Almyra Deforest, PA-C  Please call our office 2 months in advance to schedule this appointment   At [facility], you and your health needs are our priority.  As part of our continuing mission to provide you with exceptional heart care, we have created designated Provider Care Teams.  These Care Teams include your primary Cardiologist (physician) and Advanced Practice Providers (APPs -  Physician Assistants and Nurse Practitioners) who all work together to provide you with the care you need, when you need it.

## 2021-04-30 ENCOUNTER — Ambulatory Visit: Payer: Medicare Other | Admitting: Surgical

## 2021-05-04 ENCOUNTER — Encounter: Payer: Self-pay | Admitting: *Deleted

## 2021-05-14 ENCOUNTER — Ambulatory Visit (INDEPENDENT_AMBULATORY_CARE_PROVIDER_SITE_OTHER): Payer: Medicare Other | Admitting: Orthopedic Surgery

## 2021-05-14 ENCOUNTER — Encounter: Payer: Self-pay | Admitting: Orthopedic Surgery

## 2021-05-14 ENCOUNTER — Ambulatory Visit: Payer: Self-pay

## 2021-05-14 ENCOUNTER — Other Ambulatory Visit: Payer: Self-pay

## 2021-05-14 DIAGNOSIS — M1711 Unilateral primary osteoarthritis, right knee: Secondary | ICD-10-CM | POA: Diagnosis not present

## 2021-05-14 DIAGNOSIS — M25561 Pain in right knee: Secondary | ICD-10-CM | POA: Diagnosis not present

## 2021-05-14 DIAGNOSIS — M25562 Pain in left knee: Secondary | ICD-10-CM

## 2021-05-14 DIAGNOSIS — G8929 Other chronic pain: Secondary | ICD-10-CM | POA: Diagnosis not present

## 2021-05-14 DIAGNOSIS — M1712 Unilateral primary osteoarthritis, left knee: Secondary | ICD-10-CM | POA: Diagnosis not present

## 2021-05-14 DIAGNOSIS — M170 Bilateral primary osteoarthritis of knee: Secondary | ICD-10-CM

## 2021-05-14 MED ORDER — BUPIVACAINE HCL 0.25 % IJ SOLN
4.0000 mL | INTRAMUSCULAR | Status: AC | PRN
  Administered 2021-05-14: 4 mL via INTRA_ARTICULAR

## 2021-05-14 MED ORDER — METHYLPREDNISOLONE ACETATE 40 MG/ML IJ SUSP
40.0000 mg | INTRAMUSCULAR | Status: AC | PRN
  Administered 2021-05-14: 40 mg via INTRA_ARTICULAR

## 2021-05-14 MED ORDER — LIDOCAINE HCL 1 % IJ SOLN
5.0000 mL | INTRAMUSCULAR | Status: AC | PRN
  Administered 2021-05-14: 5 mL

## 2021-05-14 NOTE — Progress Notes (Signed)
Now  Office Visit Note   Patient: [name]           Date of Birth: November 25, 1955           MRN: [id number] Visit Date: 05/14/2021 Requested by: [name], MD [address] [place],  [place] [address] PCP: [name], MD  Subjective: Chief Complaint  Patient presents with   Right Knee - Pain   Left Knee - Pain    HPI: [name] is a patient with bilateral knee pain.  Right is worse than left.  She has had pain for about 2 months.  Denies any history of injury.  Does use a knee brace.  Previous injections have helped some.  She reports some cracking and popping.  Takes Tylenol for pain.  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  She also is married to her husband who recently had his second leg amputated.  She does report decrease standing and walking endurance and is under some degree of physical and emotional stress due to her current social situation.              ROS: All systems reviewed are negative as they relate to the chief complaint within the history of present illness.  Patient denies  fevers or chills.   Assessment & Plan: Visit Diagnoses:  1. Chronic pain of both knees     Plan: Impression is bilateral knee arthritis which is worsening to some degree but does not really look like it is in the operative category yet based on her exam radiographs and clinical condition.  She is diabetic.  We will inject and aspirate the left knee today and do the right knee in 1 week.  Follow-Up Instructions: Return in about 1 week (around 05/21/2021).   Orders:  Orders Placed This Encounter  Procedures   XR KNEE 3 VIEW RIGHT   XR KNEE 3 VIEW LEFT   No orders of the defined types were placed in this encounter.     Procedures: Large Joint Inj: L knee on 05/14/2021 7:21 PM Indications: diagnostic evaluation, joint swelling and pain Details: 18 G 1.5 in needle, superolateral approach  Arthrogram: No  Medications: 5 mL lidocaine 1 %; 40 mg  methylPREDNISolone acetate 40 MG/ML; 4 mL bupivacaine 0.25 % Outcome: tolerated well, no immediate complications Procedure, treatment alternatives, risks and benefits explained, specific risks discussed. Consent was given by the patient. Immediately prior to procedure a time out was called to verify the correct patient, procedure, equipment, support staff and site/side marked as required. Patient was prepped and draped in the usual sterile fashion.      Clinical Data: No additional findings.  Objective: Vital Signs: There were no vitals taken for this visit.  Physical Exam:   Constitutional: Patient appears well-developed HEENT:  Head: Normocephalic Eyes:EOM are normal Neck: Normal range of motion Cardiovascular: Normal rate Pulmonary/chest: Effort normal Neurologic: Patient is alert Skin: Skin is warm Psychiatric: Patient has normal mood and affect   Ortho Exam: Ortho exam demonstrates trace effusion on the left.  No effusion on the right.  She has fairly straight knees with 1 to 2 degree flexion contracture bilaterally.  Bends easily past 90 to about 120 degrees.  Collateral and cruciate ligaments are stable.  Not too much patellofemoral crepitus is present.  No groin pain with internal ex rotation leg.  Pedal pulses palpable.  No other masses lymphadenopathy or skin changes noted in that knee region.  Specialty Comments:  No specialty comments available.  Imaging: XR KNEE 3 VIEW LEFT  Result Date: 05/14/2021 AP lateral radiographs left knee reviewed.  Mild varus alignment present.  Medial compartment osteoarthritis which is moderate is present.  No acute fracture.  Patella height normal relative to distal femur.  XR KNEE 3 VIEW RIGHT  Result Date: 05/14/2021 AP lateral merchant radiographs right knee reviewed.  Mild varus alignment is present.  Arthritis is present in the medial compartment.  Some spurring is present.  No acute fracture.  Patella height normal relative the  distal femur.    PMFS History: Patient Active Problem List   Diagnosis Date Noted   Palpitations 04/25/2021   Leg swelling 06/30/2019   SOB (shortness of breath) 06/30/2019   Educated about COVID-19 virus infection 06/30/2019   AKI (acute kidney injury) ([place]) 03/20/2016   DM type 2, goal HbA1c < 7% (H[facility]) 03/20/2016   Chronic diastolic CHF (congestive heart failure) ([place]) 03/20/2016   Sigmoid diverticulitis 03/20/2016   Diverticulitis 03/20/2016   Leukocytosis 03/19/2016   Chest pain 08/31/2013   CHEST PAIN-UNSPECIFIED 08/04/2009   THYROID NODULE, LEFT 07/30/2009   Diabetes mellitus ([place]) 07/30/2009   HYPERLIPIDEMIA 07/30/2009   Obstructive sleep apnea 07/30/2009   Essential hypertension 07/30/2009   GERD 07/30/2009   Irritable bowel syndrome 07/30/2009   Past Medical History:  Diagnosis Date   Complication of anesthesia    " I have to be awake to be intubated because I have a mass in my throut "   Diabetes mellitus    Difficult intubation    mass in throut   Diverticulitis    Hyperlipidemia    Hypertension    Migraines 2013   Sleep apnea    CPAP    Family History  Problem Relation Age of Onset   Diabetes Father    Hypertension Father    CAD Daughter 28       CABG X 3    Past Surgical History:  Procedure Laterality Date   ABDOMINAL HYSTERECTOMY     GALLBLADDER SURGERY     KNEE ARTHROSCOPY     lymph nodectomy     NASAL SINUS SURGERY     SPLENIC ARTERY EMBOLIZATION     due to Aneurysm   TONSILLECTOMY     Social History   Occupational History   Not on file  Tobacco Use   Smoking status: Never   Smokeless tobacco: Never  Substance and Sexual Activity   Alcohol use: No   Drug use: No   Sexual activity: Yes    Birth control/protection: Surgical    Comment: hysterectomy

## 2021-05-24 ENCOUNTER — Telehealth: Payer: Self-pay

## 2021-05-24 ENCOUNTER — Ambulatory Visit (INDEPENDENT_AMBULATORY_CARE_PROVIDER_SITE_OTHER): Payer: Medicare Other | Admitting: Orthopedic Surgery

## 2021-05-24 ENCOUNTER — Other Ambulatory Visit: Payer: Self-pay

## 2021-05-24 DIAGNOSIS — M170 Bilateral primary osteoarthritis of knee: Secondary | ICD-10-CM

## 2021-05-24 DIAGNOSIS — M1711 Unilateral primary osteoarthritis, right knee: Secondary | ICD-10-CM | POA: Diagnosis not present

## 2021-05-24 NOTE — Telephone Encounter (Signed)
Auth needed for bilat knee gel injections  

## 2021-05-27 ENCOUNTER — Encounter: Payer: Self-pay | Admitting: Orthopedic Surgery

## 2021-05-27 MED ORDER — BUPIVACAINE HCL 0.25 % IJ SOLN
4.0000 mL | INTRAMUSCULAR | Status: AC | PRN
  Administered 2021-05-24: 4 mL via INTRA_ARTICULAR

## 2021-05-27 MED ORDER — LIDOCAINE HCL 1 % IJ SOLN
5.0000 mL | INTRAMUSCULAR | Status: AC | PRN
  Administered 2021-05-24: 5 mL

## 2021-05-27 MED ORDER — METHYLPREDNISOLONE ACETATE 40 MG/ML IJ SUSP
40.0000 mg | INTRAMUSCULAR | Status: AC | PRN
  Administered 2021-05-24: 40 mg via INTRA_ARTICULAR

## 2021-05-27 NOTE — Progress Notes (Signed)
° °  Procedure Note  Patient: [name]             Date of Birth: Jan 17, 1956           MRN: [id number]             Visit Date: 05/24/2021  Procedures: Visit Diagnoses:  1. Primary osteoarthritis of both knees     Large Joint Inj: R knee on 05/24/2021 11:04 PM Indications: diagnostic evaluation, joint swelling and pain Details: 18 G 1.5 in needle, superolateral approach  Arthrogram: No  Medications: 5 mL lidocaine 1 %; 40 mg methylPREDNISolone acetate 40 MG/ML; 4 mL bupivacaine 0.25 % Outcome: tolerated well, no immediate complications Procedure, treatment alternatives, risks and benefits explained, specific risks discussed. Consent was given by the patient. Immediately prior to procedure a time out was called to verify the correct patient, procedure, equipment, support staff and site/side marked as required. Patient was prepped and draped in the usual sterile fashion.

## 2021-05-28 NOTE — Telephone Encounter (Signed)
What is required on my part?  Thanks

## 2021-05-31 NOTE — Telephone Encounter (Signed)
Noted.  Will submit after 06/15/2021.

## 2021-06-05 ENCOUNTER — Encounter (HOSPITAL_COMMUNITY): Payer: Self-pay | Admitting: Emergency Medicine

## 2021-06-05 ENCOUNTER — Other Ambulatory Visit: Payer: Self-pay

## 2021-06-05 ENCOUNTER — Emergency Department (HOSPITAL_COMMUNITY): Payer: Medicare Other

## 2021-06-05 ENCOUNTER — Emergency Department (HOSPITAL_COMMUNITY)
Admission: EM | Admit: 2021-06-05 | Discharge: 2021-06-05 | Disposition: A | Discharge status: Home / Self Care | Payer: Medicare Other | Attending: Emergency Medicine | Admitting: Emergency Medicine

## 2021-06-05 DIAGNOSIS — I110 Hypertensive heart disease with heart failure: Secondary | ICD-10-CM | POA: Insufficient documentation

## 2021-06-05 DIAGNOSIS — Z79899 Other long term (current) drug therapy: Secondary | ICD-10-CM | POA: Insufficient documentation

## 2021-06-05 DIAGNOSIS — I5032 Chronic diastolic (congestive) heart failure: Secondary | ICD-10-CM | POA: Insufficient documentation

## 2021-06-05 DIAGNOSIS — E119 Type 2 diabetes mellitus without complications: Secondary | ICD-10-CM | POA: Insufficient documentation

## 2021-06-05 DIAGNOSIS — I493 Ventricular premature depolarization: Secondary | ICD-10-CM | POA: Diagnosis not present

## 2021-06-05 DIAGNOSIS — R0789 Other chest pain: Secondary | ICD-10-CM | POA: Diagnosis present

## 2021-06-05 HISTORY — DX: Unspecified atrial fibrillation: I48.91

## 2021-06-05 LAB — BASIC METABOLIC PANEL
Anion gap: 9 (ref 5–15)
BUN: 12 mg/dL (ref 8–23)
CO2: 28 mmol/L (ref 22–32)
Calcium: 9.4 mg/dL (ref 8.9–10.3)
Chloride: 102 mmol/L (ref 98–111)
Creatinine, Ser: 1.07 mg/dL — ABNORMAL HIGH (ref 0.44–1.00)
GFR, Estimated: 57 mL/min — ABNORMAL LOW (ref >60)
Glucose, Bld: 120 mg/dL — ABNORMAL HIGH (ref 70–99)
Potassium: 3.7 mmol/L (ref 3.5–5.1)
Sodium: 139 mmol/L (ref 135–145)

## 2021-06-05 LAB — CBC
HCT: 43.2 % (ref 36.0–46.0)
Hemoglobin: 14 g/dL (ref 12.0–15.0)
MCH: 31.5 pg (ref 26.0–34.0)
MCHC: 32.4 g/dL (ref 30.0–36.0)
MCV: 97.3 fL (ref 80.0–100.0)
Platelets: 336 10*3/uL (ref 150–400)
RBC: 4.44 MIL/uL (ref 3.87–5.11)
RDW: 13.9 % (ref 11.5–15.5)
WBC: 13.7 10*3/uL — ABNORMAL HIGH (ref 4.0–10.5)
nRBC: 0 % (ref 0.0–0.2)

## 2021-06-05 LAB — TROPONIN I (HIGH SENSITIVITY)
Troponin I (High Sensitivity): 4 ng/L (ref <18)
Troponin I (High Sensitivity): 4 ng/L (ref <18)

## 2021-06-05 MED ORDER — METOPROLOL TARTRATE 25 MG PO TABS
25.0000 mg | ORAL_TABLET | Freq: Once | ORAL | Status: AC
  Administered 2021-06-05: 11:00:00 25 mg via ORAL
  Filled 2021-06-05: qty 1

## 2021-06-05 NOTE — Discharge Instructions (Addendum)
Discontinued the prednisone and the azithromycin.  Continue your beta-blocker and you can try increasing it to twice daily to help with the PVCs as long as your blood pressure remains normal.  Follow-up with your primary doctor or cardiologist.  Return as needed for worsening symptoms.  Incidental xray finding suggested possible thyroid enlargement.  Follow up with your primary care doctor to schedule an outpatient ultrasound.

## 2021-06-05 NOTE — ED Notes (Signed)
ED Provider at bedside. 

## 2021-06-05 NOTE — ED Triage Notes (Addendum)
Pt diagnosed with COVID 1 week ago.  States she has been in and out of afib since yesterday with intermittent chest pain and nausea.  Denies chest pain at present.

## 2021-06-05 NOTE — ED Notes (Signed)
Pt transported to Xray. 

## 2021-06-05 NOTE — ED Notes (Addendum)
Pt verbalizes understanding of d/c instructions. Pt ambulatory at d/c with all belongings.   

## 2021-06-05 NOTE — ED Provider Notes (Signed)
[facility] EMERGENCY DEPARTMENT Provider Note   CSN: [id number] Arrival date & time: 06/05/21  0715     History Chief complaint: Palpitations.  [name] is a 65 y.o. female.  HPI  Patient presents to the ED for evaluation of palpitations as well as some intermittent chest discomfort associated with these palpitations since yesterday.  Patient states she was diagnosed with COVID about a week ago.  She was started on antiviral agents and has finished those.  She was also started on azithromycin and prednisone.  Patient states she has noticed since yesterday the her heart seems to be going in and out of rhythm.  She feels its jumping.  She also noticed that her heart rate is going very slow than very fast.  She has a pulse oximeter monitor that she has used to measure her heart rate.  She has not had any syncopal episodes.  She has not had any trouble with her breathing or leg swelling.  Patient does not have a history of heart disease but she does have a history of atrial fibrillation.  Patient was concerned so she came to the ED this morning for evaluation.  Past Medical History:  Diagnosis Date   Atrial fibrillation ([place])    Complication of anesthesia    " I have to be awake to be intubated because I have a mass in my throut "   Diabetes mellitus    Difficult intubation    mass in throut   Diverticulitis    Hyperlipidemia    Hypertension    Migraines 2013   Sleep apnea    CPAP    Patient Active Problem List   Diagnosis Date Noted   Palpitations 04/25/2021   Leg swelling 06/30/2019   SOB (shortness of breath) 06/30/2019   Educated about COVID-19 virus infection 06/30/2019   AKI (acute kidney injury) ([place]) 03/20/2016   DM type 2, goal HbA1c < 7% (HCC) 03/20/2016   Chronic diastolic CHF (congestive heart failure) ([place]) 03/20/2016   Sigmoid diverticulitis 03/20/2016   Diverticulitis 03/20/2016   Leukocytosis 03/19/2016   Chest pain 08/31/2013    CHEST PAIN-UNSPECIFIED 08/04/2009   THYROID NODULE, LEFT 07/30/2009   Diabetes mellitus ([place]) 07/30/2009   HYPERLIPIDEMIA 07/30/2009   Obstructive sleep apnea 07/30/2009   Essential hypertension 07/30/2009   GERD 07/30/2009   Irritable bowel syndrome 07/30/2009    Past Surgical History:  Procedure Laterality Date   ABDOMINAL HYSTERECTOMY     GALLBLADDER SURGERY     KNEE ARTHROSCOPY     lymph nodectomy     NASAL SINUS SURGERY     SPLENIC ARTERY EMBOLIZATION     due to Aneurysm   TONSILLECTOMY       OB History   No obstetric history on file.     Family History  Problem Relation Age of Onset   Diabetes Father    Hypertension Father    CAD Daughter 31       CABG X 3    Social History   Tobacco Use   Smoking status: Never   Smokeless tobacco: Never  Substance Use Topics   Alcohol use: No   Drug use: No    Home Medications Prior to Admission medications   Medication Sig Start Date End Date Taking? Authorizing Provider  acetaminophen (TYLENOL) 500 MG tablet Take 500 mg by mouth every 6 (six) hours as needed (for pain).    [provider]  albuterol (VENTOLIN HFA) 108 (90 Base) MCG/ACT  inhaler Inhale 2 puffs into the lungs every 6 (six) hours as needed for wheezing or shortness of breath.  02/08/12   [provider]  azelastine (ASTELIN) 0.1 % nasal spray Place 2 sprays into both nostrils 2 (two) times daily. 02/01/20   Tasia Catchings, Amy V, PA-C  clonazePAM (KLONOPIN) 0.5 MG tablet Take 0.5 mg by mouth at bedtime. For sleep 08/16/13   [provider]  CRESTOR 20 MG tablet Take 10 mg by mouth daily. 08/07/13   [provider]  diltiazem (DILACOR XR) 180 MG 24 hr capsule DILT-XR 180 mg capsule, extended release  TAKE 1 CAPSULE BY MOUTH EVERY DAY    [provider]  diphenhydrAMINE (BENADRYL) 50 MG tablet Take 1 tablet (50 mg total) by mouth once for 1 dose. 11/22/18 04/26/21  Minus Breeding, MD  EPINEPHrine 0.3 mg/0.3 mL IJ SOAJ injection  Inject 0.3 mLs as directed as needed (for an anaphylactic reaction).  02/14/13   [provider]  esomeprazole (NEXIUM) 40 MG capsule Take 40 mg by mouth daily.  Patient not taking: Reported on 04/26/2021 02/07/12   [provider]  Eyelid Cleansers (AVENOVA) 0.01 % SOLN Place 1 application into the right eye daily.  08/16/17   [provider]  famotidine (PEPCID) 40 MG tablet famotidine 40 mg tablet  TAKE 1 TABLET BY MOUTH EVERYDAY AT BEDTIME Patient not taking: Reported on 04/26/2021 09/25/18   [provider]  fluticasone (FLONASE) 50 MCG/ACT nasal spray Place 1 spray into both nostrils daily as needed for allergies or rhinitis.     [provider]  Fluticasone-Salmeterol (ADVAIR) 500-50 MCG/DOSE AEPB Inhale 1 puff into the lungs daily as needed (for shortness of breath).  02/08/12   [provider]  furosemide (LASIX) 40 MG tablet Take 40 mg by mouth daily.  08/07/13   [provider]  methocarbamol (ROBAXIN) 500 MG tablet Take 500 mg by mouth 3 (three) times daily as needed for muscle spasms.  Patient not taking: Reported on 04/26/2021 06/05/13   [provider]  metoprolol tartrate (LOPRESSOR) 25 MG tablet Take 1 tablet (25 mg total) by mouth daily. 04/26/21 07/25/21  Minus Breeding, MD  montelukast (SINGULAIR) 10 MG tablet Take 10 mg by mouth at bedtime.  02/21/15   [provider]  Multiple Vitamin (MULTIVITAMIN WITH MINERALS) TABS tablet Take 1 tablet by mouth daily.    [provider]  omeprazole (PRILOSEC) 20 MG capsule Take 1 capsule (20 mg total) by mouth daily. 06/07/20   Horton, Barbette Hair, MD  ondansetron (ZOFRAN) 4 MG tablet Take 1 tablet (4 mg total) by mouth every 6 (six) hours as needed for nausea. 03/24/16   Theodis Blaze, MD  predniSONE (DELTASONE) 50 MG tablet Take 1 tablet 13 hour prior to procedure, take another 7 hour prior to procedure and last tablet 1 hour prior to procedure Patient not  taking: Reported on 04/26/2021 11/22/18   Minus Breeding, MD  predniSONE (DELTASONE) 50 MG tablet Take 1 tablet (50 mg total) by mouth daily with breakfast. Patient not taking: Reported on 04/26/2021 02/01/20   Ok Edwards, PA-C  quinapril (ACCUPRIL) 40 MG tablet Take 40 mg by mouth daily.  08/07/13   [provider]  RESTASIS 0.05 % ophthalmic emulsion Place 1 drop into both eyes 2 (two) times daily. 08/23/17   [provider]  saccharomyces boulardii (FLORASTOR) 250 MG capsule Take 1 capsule (250 mg total) by mouth 2 (two) times daily. Patient taking differently: Take  250 mg by mouth daily. 03/24/16   Theodis Blaze, MD  sucralfate (CARAFATE) 1 g tablet Take 1 tablet (1 g total) by mouth 4 (four) times daily -  with meals and at bedtime. Patient not taking: Reported on 04/26/2021 06/07/20   Horton, Barbette Hair, MD    Allergies    Aspirin, Bee venom, Demerol [meperidine], Ibuprofen, Meperidine hcl, Nsaids, Iohexol, Clindamycin/lincomycin, Codeine, Ivp dye [iodinated contrast media], Mupirocin, Neosporin [neomycin-bacitracin zn-polymyx], Pollen extract, Sulfonamide derivatives, Tramadol, Cephalosporins, and Penicillins  Review of Systems   Review of Systems  All other systems reviewed and are negative.  Physical Exam Updated Vital Signs BP 127/81    Pulse 69    Temp 98.4 F (36.9 C)    Resp 12    Ht 1.74 m (5' 8.5")    Wt 95.7 kg    SpO2 98%    BMI 31.62 kg/m   Physical Exam Vitals and nursing note reviewed.  Constitutional:      General: She is not in acute distress.    Appearance: She is well-developed.  HENT:     Head: Normocephalic and atraumatic.     Right Ear: External ear normal.     Left Ear: External ear normal.  Eyes:     General: No scleral icterus.       Right eye: No discharge.        Left eye: No discharge.     Conjunctiva/sclera: Conjunctivae normal.  Neck:     Trachea: No tracheal deviation.  Cardiovascular:     Rate and Rhythm: Normal rate and  regular rhythm.  Pulmonary:     Effort: Pulmonary effort is normal. No respiratory distress.     Breath sounds: Normal breath sounds. No stridor. No wheezing or rales.  Abdominal:     General: Bowel sounds are normal. There is no distension.     Palpations: Abdomen is soft.     Tenderness: There is no abdominal tenderness. There is no guarding or rebound.  Musculoskeletal:        General: No tenderness or deformity.     Cervical back: Neck supple.  Skin:    General: Skin is warm and dry.     Findings: No rash.  Neurological:     General: No focal deficit present.     Mental Status: She is alert.     Cranial Nerves: No cranial nerve deficit (no facial droop, extraocular movements intact, no slurred speech).     Sensory: No sensory deficit.     Motor: No abnormal muscle tone or seizure activity.     Coordination: Coordination normal.  Psychiatric:        Mood and Affect: Mood normal.    ED Results / Procedures / Treatments   Labs (all labs ordered are listed, but only abnormal results are displayed) Labs Reviewed  BASIC METABOLIC PANEL - Abnormal; Notable for the following components:      Result Value   Glucose, Bld 120 (*)    Creatinine, Ser 1.07 (*)    GFR, Estimated 57 (*)    All other components within normal limits  CBC - Abnormal; Notable for the following components:   WBC 13.7 (*)    All other components within normal limits  TROPONIN I (HIGH SENSITIVITY)  TROPONIN I (HIGH SENSITIVITY)    EKG EKG Interpretation  Date/Time:  Saturday June 05 2021 07:11:56 EST Ventricular Rate:  82 PR Interval:  194 QRS Duration: 80 QT Interval:  366 QTC Calculation:  427 R Axis:   43 Text Interpretation: Normal sinus rhythm Low voltage QRS Cannot rule out Anterior infarct , age undetermined Abnormal ECG No significant change since last tracing Confirmed by Dorie Rank [phone]) on 06/05/2021 7:52:47 AM  Radiology DG Chest 2 View  Result Date: 06/05/2021 CLINICAL DATA:   Chest pain EXAM: CHEST - 2 VIEW COMPARISON:  Previous studies including the examination of 06/06/2020 FINDINGS: Cardiac size is within normal limits. Lung fields are clear of any infiltrates or pulmonary edema. There is no pleural effusion or pneumothorax. There is extrinsic pressure over the left lateral margin of trachea in the lower neck suggesting enlarged thyroid. Surgical clips are seen in the left upper quadrant of abdomen. IMPRESSION: There are no signs of pulmonary edema or focal pulmonary consolidation. Possible enlarged left lobe of thyroid causing extrinsic pressure over the trachea in the lower neck. Electronically Signed   By: Elmer Picker M.D.   On: 06/05/2021 09:10    Procedures .1-3 Lead EKG Interpretation Performed by: Dorie Rank, MD Authorized by: Dorie Rank, MD     Interpretation: abnormal     ECG rate:  70   ECG rate assessment: normal     Rhythm: sinus rhythm     Ectopy: PVCs     Conduction: normal     Medications Ordered in ED Medications  metoprolol tartrate (LOPRESSOR) tablet 25 mg (25 mg Oral Given 06/05/21 1057)    ED Course  I have reviewed the triage vital signs and the nursing notes.  Pertinent labs & imaging results that were available during my care of the patient were reviewed by me and considered in my medical decision making (see chart for details).  Clinical Course as of 06/05/21 1211  Sat Jun 05, 2021  0810 Discussed with ED staff.  On arrival she was noted to have a PVC on the monitor that the patient felt.  This correlated with her symptoms [JK]    Clinical Course User Index [JK] Dorie Rank, MD   MDM Rules/Calculators/A&P                         Patient presented to the ED for evaluation of palpitations.  Patient was concerned she was in recurrent A. fib.  EKG shows a sinus rhythm but patient does have obvious PVCs noted on the cardiac telemetry.  Patient's ED work-up is otherwise reassuring.  No signs of significant electrolyte  abnormalities.  Findings not suggestive of acute coronary syndrome.  Patient was recently started on azithromycin and prednisone.  This was started empirically by her doctor after her recent COVID diagnosis and worsening sx.  Think it is reasonable for her to discontinue that medication at Its possible it could be contributing to the PVCs.  Otherwise recommended continuing her beta-blocker.  Follow-up with her PCP or cardiologist.  Recommended outpt follow of possible thyroid nodule.    Final Clinical Impression(s) / ED Diagnoses Final diagnoses:  PVC (premature ventricular contraction)    Rx / DC Orders ED Discharge Orders     None        Dorie Rank, MD 06/05/21 1211

## 2021-06-05 NOTE — ED Notes (Signed)
Pts pulse noted to jump between low 40s to 80s. Happened several times in triage

## 2021-06-29 NOTE — Progress Notes (Signed)
Cardiology Office Note   Date:  07/02/2021   ID:  [name], DOB [birth year], MRN [id number]  PCP:  Katrina Solian, MD  Cardiologist:   Minus Breeding, MD   Chief Complaint  Patient presents with   Palpitations      History of Present Illness: [name] is a 66 y.o. female who is referred by Katrina Solian, MD for evaluation of palpitations and arm pain.      I saw see her in March 2015 when she was briefly hospitalized  For evaluation of chest pain. This was atypical. Follow-up stress testing was unremarkable for any evidence of ischemia.   I saw her in 2016 preop prior to tonsillectomy.  She also had an echocardiogram in 2017 and there were no significant abnormalities.   Since I last saw her she was in the ED with palpitations .   I reviewed these records.  She was concerned that she was back in atrial fib.  However, she was in normal sinus.    She had had palpitations and these were fairly significant.  She was feeling well on throughout mostly the evening or when she would lie down at night.  She would wear a pulse ox and her heart rate go from 31-80.  She did not have presyncope or syncope.  She did not have chest pressure, neck or arm discomfort.  She has not had weight gain or edema.  Of note she was told she had some PVCs  \ Past Medical History:  Diagnosis Date   Atrial fibrillation ([place])    Complication of anesthesia    " I have to be awake to be intubated because I have a mass in my throut "   Diabetes mellitus    Difficult intubation    mass in throut   Diverticulitis    Hyperlipidemia    Hypertension    Migraines 2013   Sleep apnea    CPAP    Past Surgical History:  Procedure Laterality Date   ABDOMINAL HYSTERECTOMY     GALLBLADDER SURGERY     KNEE ARTHROSCOPY     lymph nodectomy     NASAL SINUS SURGERY     SPLENIC ARTERY EMBOLIZATION     due to Aneurysm   TONSILLECTOMY       Current Outpatient Medications  Medication Sig  Dispense Refill   acetaminophen (TYLENOL) 500 MG tablet Take 500 mg by mouth every 6 (six) hours as needed (for pain).     albuterol (VENTOLIN HFA) 108 (90 Base) MCG/ACT inhaler Inhale 2 puffs into the lungs every 6 (six) hours as needed for wheezing or shortness of breath.      azelastine (ASTELIN) 0.1 % nasal spray Place 2 sprays into both nostrils 2 (two) times daily. 30 mL 0   clonazePAM (KLONOPIN) 0.5 MG tablet Take 0.5 mg by mouth at bedtime. For sleep     CRESTOR 20 MG tablet Take 10 mg by mouth daily.     diltiazem (DILACOR XR) 180 MG 24 hr capsule DILT-XR 180 mg capsule, extended release  TAKE 1 CAPSULE BY MOUTH EVERY DAY     EPINEPHrine 0.3 mg/0.3 mL IJ SOAJ injection Inject 0.3 mLs as directed as needed (for an anaphylactic reaction).      esomeprazole (NEXIUM) 40 MG capsule Take 40 mg by mouth daily.     Eyelid Cleansers (AVENOVA) 0.01 % SOLN Place 1 application into the right eye daily.      fluticasone (  FLONASE) 50 MCG/ACT nasal spray Place 1 spray into both nostrils daily as needed for allergies or rhinitis.      furosemide (LASIX) 40 MG tablet Take 40 mg by mouth daily.      metoprolol tartrate (LOPRESSOR) 25 MG tablet Take 1 tablet (25 mg total) by mouth daily. 30 tablet 3   montelukast (SINGULAIR) 10 MG tablet Take 10 mg by mouth at bedtime.      Multiple Vitamin (MULTIVITAMIN WITH MINERALS) TABS tablet Take 1 tablet by mouth daily.     omeprazole (PRILOSEC) 20 MG capsule Take 1 capsule (20 mg total) by mouth daily. 30 capsule 0   ondansetron (ZOFRAN) 4 MG tablet Take 1 tablet (4 mg total) by mouth every 6 (six) hours as needed for nausea. 20 tablet 0   quinapril (ACCUPRIL) 40 MG tablet Take 40 mg by mouth daily.      RESTASIS 0.05 % ophthalmic emulsion Place 1 drop into both eyes 2 (two) times daily.  3   saccharomyces boulardii (FLORASTOR) 250 MG capsule Take 1 capsule (250 mg total) by mouth 2 (two) times daily. (Patient taking differently: Take 250 mg by mouth daily.) 60  capsule 0   diphenhydrAMINE (BENADRYL) 50 MG tablet Take 1 tablet (50 mg total) by mouth once for 1 dose. 1 tablet 0   famotidine (PEPCID) 40 MG tablet famotidine 40 mg tablet  TAKE 1 TABLET BY MOUTH EVERYDAY AT BEDTIME (Patient not taking: Reported on 04/26/2021)     Fluticasone-Salmeterol (ADVAIR) 500-50 MCG/DOSE AEPB Inhale 1 puff into the lungs daily as needed (for shortness of breath).      methocarbamol (ROBAXIN) 500 MG tablet Take 500 mg by mouth 3 (three) times daily as needed for muscle spasms.  (Patient not taking: Reported on 04/26/2021)     predniSONE (DELTASONE) 50 MG tablet Take 1 tablet 13 hour prior to procedure, take another 7 hour prior to procedure and last tablet 1 hour prior to procedure (Patient not taking: Reported on 04/26/2021) 3 tablet 0   predniSONE (DELTASONE) 50 MG tablet Take 1 tablet (50 mg total) by mouth daily with breakfast. (Patient not taking: Reported on 04/26/2021) 5 tablet 0   sucralfate (CARAFATE) 1 g tablet Take 1 tablet (1 g total) by mouth 4 (four) times daily -  with meals and at bedtime. (Patient not taking: Reported on 04/26/2021) 60 tablet 0   No current facility-administered medications for this visit.    Allergies:   Aspirin, Bee venom, Demerol [meperidine], Ibuprofen, Meperidine hcl, Nsaids, Iohexol, Clindamycin/lincomycin, Codeine, Ivp dye [iodinated contrast media], Mupirocin, Neosporin [neomycin-bacitracin zn-polymyx], Pollen extract, Sulfonamide derivatives, Tramadol, Cephalosporins, and Penicillins    ROS:  Please see the history of present illness.   Otherwise, review of systems are positive for none.   All other systems are reviewed and negative.    PHYSICAL EXAM: VS:  BP 130/74    Pulse 65    Ht 5' 8.5" (1.74 m)    Wt 228 lb (103.4 kg)    SpO2 95%    BMI 34.16 kg/m  , BMI Body mass index is 34.16 kg/m. GENERAL:  Well appearing NECK:  No jugular venous distention, waveform within normal limits, carotid upstroke brisk and symmetric, no  bruits, no thyromegaly LUNGS:  Clear to auscultation bilaterally CHEST:  Unremarkable HEART:  PMI not displaced or sustained,S1 and S2 within normal limits, no S3, no S4, no clicks, no rubs, no murmurs ABD:  Flat, positive bowel sounds normal in frequency in pitch, no  bruits, no rebound, no guarding, no midline pulsatile mass, no hepatomegaly, no splenomegaly EXT:  2 plus pulses throughout, mild edema, no cyanosis no clubbing  EKG:  EKG is not ordered today.  Recent Labs: 04/26/2021: TSH 1.170 06/05/2021: BUN 12; Creatinine, Ser 1.07; Hemoglobin 14.0; Platelets 336; Potassium 3.7; Sodium 139    Lipid Panel    Wt Readings from Last 3 Encounters:  07/02/21 228 lb (103.4 kg)  06/05/21 211 lb (95.7 kg)  04/26/21 222 lb 12.8 oz (101.1 kg)      Other studies Reviewed: Additional studies/ records that were reviewed today include: ED records Review of the above records demonstrates:  Please see elsewhere in the .     ASSESSMENT AND PLAN:  PALPITATIONS: The patient was apparently noted to have some PVCs although I do not see these on EKG.  I am going to check a magnesium level.  She is going to start taking her beta-blocker in the evening since this is when the symptoms are.  If they persist we might need to add a little bit beta-blocker in the morning as well.  She will continue on the calcium channel blocker.  She was reassured by this.  If she has significantly increased symptoms we would get a monitor at home.  I do note that her TSH was checked late last year.  This was normal.   I have suggested that she get a lot of cooler.  HTN:   BP is at target.  No change in therapy.   CHEST PAIN:   She has had no chest pain.  She had negative stress test 2020.   EDEMA:    She has trace leg edema.    Current medicines are reviewed at length with the patient today.  The patient does not have concerns regarding medicines.  The following changes have been made: None except for change in the  timing of her beta-blocker  Labs/ tests ordered today include: Mg  Orders Placed This Encounter  Procedures   Magnesium    Disposition:   FU with me as needed.    Signed, Minus Breeding, MD  07/02/2021 8:32 AM    [facility]

## 2021-07-02 ENCOUNTER — Other Ambulatory Visit: Payer: Self-pay

## 2021-07-02 ENCOUNTER — Encounter: Payer: Self-pay | Admitting: Cardiology

## 2021-07-02 ENCOUNTER — Ambulatory Visit (INDEPENDENT_AMBULATORY_CARE_PROVIDER_SITE_OTHER): Payer: Medicare Other | Admitting: Cardiology

## 2021-07-02 VITALS — BP 130/74 | HR 65 | Ht 68.5 in | Wt 228.0 lb

## 2021-07-02 DIAGNOSIS — M7989 Other specified soft tissue disorders: Secondary | ICD-10-CM | POA: Diagnosis not present

## 2021-07-02 DIAGNOSIS — R002 Palpitations: Secondary | ICD-10-CM | POA: Diagnosis not present

## 2021-07-02 DIAGNOSIS — R079 Chest pain, unspecified: Secondary | ICD-10-CM

## 2021-07-02 DIAGNOSIS — I10 Essential (primary) hypertension: Secondary | ICD-10-CM | POA: Diagnosis not present

## 2021-07-02 LAB — MAGNESIUM: Magnesium: 2.1 mg/dL (ref 1.6–2.3)

## 2021-07-02 NOTE — Patient Instructions (Signed)
Medication Instructions:  Your physician recommends that you continue on your current medications as directed. Please refer to the Current Medication list given to you today.  *If you need a refill on your cardiac medications before your next appointment, please call your pharmacy*   Lab Work: Your physician recommends that you have labs drawn today: Mag level  If you have labs (blood work) drawn today and your tests are completely normal, you will receive your results only by: MyChart Message (if you have MyChart) OR A paper copy in the mail If you have any lab test that is abnormal or we need to change your treatment, we will call you to review the results.   Follow-Up: At [facility], you and your health needs are our priority.  As part of our continuing mission to provide you with exceptional heart care, we have created designated Provider Care Teams.  These Care Teams include your primary Cardiologist (physician) and Advanced Practice Providers (APPs -  Physician Assistants and Nurse Practitioners) who all work together to provide you with the care you need, when you need it.  We recommend signing up for the patient portal called "MyChart".  Sign up information is provided on this After Visit Summary.  MyChart is used to connect with patients for Virtual Visits (Telemedicine).  Patients are able to view lab/test results, encounter notes, upcoming appointments, etc.  Non-urgent messages can be sent to your provider as well.   To learn more about what you can do with MyChart, go to [email or web address].    Your next appointment:   We will see you on an as needed basis.  Provider:   Minus Breeding, MD     Other Instructions [facility] or [facility] for EKG monitor

## 2021-07-09 ENCOUNTER — Encounter: Payer: Self-pay | Admitting: *Deleted

## 2021-07-15 ENCOUNTER — Ambulatory Visit: Admitting: Podiatry

## 2021-07-30 ENCOUNTER — Other Ambulatory Visit: Payer: Self-pay | Admitting: Cardiology

## 2021-08-03 ENCOUNTER — Telehealth: Payer: Self-pay | Admitting: Orthopedic Surgery

## 2021-08-03 NOTE — Telephone Encounter (Signed)
Pt called. She is wanting to get a gel injection for bil knee pain   VF [phone]

## 2021-08-03 NOTE — Telephone Encounter (Signed)
Noted  

## 2021-08-23 ENCOUNTER — Telehealth: Payer: Self-pay | Admitting: Orthopedic Surgery

## 2021-08-23 NOTE — Telephone Encounter (Signed)
Pt called requesting a call back from April J. Pt calling for status of approval for knee injection. Please call pt at [phone] ?

## 2021-08-24 NOTE — Telephone Encounter (Signed)
Called and left a VM for patient to CB to schedule for gel injection with Dr. Marlou Sa ?

## 2021-08-25 ENCOUNTER — Telehealth: Payer: Self-pay

## 2021-08-25 NOTE — Telephone Encounter (Signed)
Patient called to schedule Visco with Dr. Marlou Sa, but did not know which Visco she was approved for. Please call patient to schedule appointments. ?

## 2021-08-25 NOTE — Telephone Encounter (Signed)
Talked with patient and appointment has been scheduled.  

## 2021-08-25 NOTE — Telephone Encounter (Signed)
Approved for Durolane, bilateral knee. ?[place] ?Deductible has been met ?Covered at 100% of the allowable amount ?No Co-pay ?No PA required ? ?Appt. 08/30/2021 with Dr. Marlou Sa ? ?

## 2021-08-30 ENCOUNTER — Encounter: Payer: Self-pay | Admitting: Orthopedic Surgery

## 2021-08-30 ENCOUNTER — Ambulatory Visit (INDEPENDENT_AMBULATORY_CARE_PROVIDER_SITE_OTHER): Payer: Medicare Other | Admitting: Orthopedic Surgery

## 2021-08-30 ENCOUNTER — Other Ambulatory Visit: Payer: Self-pay

## 2021-08-30 DIAGNOSIS — M170 Bilateral primary osteoarthritis of knee: Secondary | ICD-10-CM

## 2021-08-30 DIAGNOSIS — M1712 Unilateral primary osteoarthritis, left knee: Secondary | ICD-10-CM | POA: Diagnosis not present

## 2021-09-02 ENCOUNTER — Encounter: Payer: Self-pay | Admitting: Orthopedic Surgery

## 2021-09-02 DIAGNOSIS — M1712 Unilateral primary osteoarthritis, left knee: Secondary | ICD-10-CM

## 2021-09-02 MED ORDER — SODIUM HYALURONATE 60 MG/3ML IX PRSY
60.0000 mg | PREFILLED_SYRINGE | INTRA_ARTICULAR | Status: AC | PRN
  Administered 2021-08-30: 60 mg via INTRA_ARTICULAR

## 2021-09-02 MED ORDER — LIDOCAINE HCL 1 % IJ SOLN
5.0000 mL | INTRAMUSCULAR | Status: AC | PRN
  Administered 2021-08-30: 5 mL

## 2021-09-02 NOTE — Progress Notes (Addendum)
? ?  Procedure Note ? ?Patient: [name]             ?Date of Birth: 1955-11-01           ?MRN: [id number]             ?Visit Date: 08/30/2021 ? ?Procedures: ?Visit Diagnoses:  ?1. Primary osteoarthritis of both knees   ? ? ?Large Joint Inj: L knee on 08/30/2021 10:21 AM ?Indications: diagnostic evaluation, joint swelling and pain ?Details: 18 G 1.5 in needle, superolateral approach ? ?Arthrogram: No ? ?Medications: 5 mL lidocaine 1 %; 60 mg Sodium Hyaluronate 60 MG/3ML ?Outcome: tolerated well, no immediate complications ?Procedure, treatment alternatives, risks and benefits explained, specific risks discussed. Consent was given by the patient. Immediately prior to procedure a time out was called to verify the correct patient, procedure, equipment, support staff and site/side marked as required. Patient was prepped and draped in the usual sterile fashion.  ? ?Plan for right knee injection if the left knee injection helps. ? ? ? ?

## 2021-09-08 ENCOUNTER — Ambulatory Visit (INDEPENDENT_AMBULATORY_CARE_PROVIDER_SITE_OTHER): Payer: Medicare Other | Admitting: Orthopedic Surgery

## 2021-09-08 ENCOUNTER — Telehealth: Payer: Self-pay

## 2021-09-08 DIAGNOSIS — M1711 Unilateral primary osteoarthritis, right knee: Secondary | ICD-10-CM

## 2021-09-08 DIAGNOSIS — M170 Bilateral primary osteoarthritis of knee: Secondary | ICD-10-CM

## 2021-09-08 NOTE — Telephone Encounter (Signed)
Auth needed for bilat knee gel injection for 6 months from now ?

## 2021-09-10 ENCOUNTER — Encounter: Payer: Self-pay | Admitting: Orthopedic Surgery

## 2021-09-10 MED ORDER — SODIUM HYALURONATE 60 MG/3ML IX PRSY
60.0000 mg | PREFILLED_SYRINGE | INTRA_ARTICULAR | Status: AC | PRN
  Administered 2021-09-08: 60 mg via INTRA_ARTICULAR

## 2021-09-10 MED ORDER — LIDOCAINE HCL 1 % IJ SOLN
5.0000 mL | INTRAMUSCULAR | Status: AC | PRN
  Administered 2021-09-08: 5 mL

## 2021-09-10 NOTE — Progress Notes (Signed)
? ?  Procedure Note ? ?Patient: [name]             ?Date of Birth: 31-May-1956           ?MRN: [id number]             ?Visit Date: 09/08/2021 ? ?Procedures: ?Visit Diagnoses:  ?1. Primary osteoarthritis of both knees   ? ? ?Large Joint Inj: R knee on 09/08/2021 10:39 PM ?Indications: diagnostic evaluation, joint swelling and pain ?Details: 18 G 1.5 in needle, superolateral approach ? ?Arthrogram: No ? ?Medications: 5 mL lidocaine 1 %; 60 mg Sodium Hyaluronate 60 MG/3ML ?Outcome: tolerated well, no immediate complications ?Procedure, treatment alternatives, risks and benefits explained, specific risks discussed. Consent was given by the patient. Immediately prior to procedure a time out was called to verify the correct patient, procedure, equipment, support staff and site/side marked as required. Patient was prepped and draped in the usual sterile fashion.  ? ? ?This patient is diagnosed with osteoarthritis of the knee(s).   ? ?Radiographs show evidence of joint space narrowing, osteophytes, subchondral sclerosis and/or subchondral cysts.  This patient has knee pain which interferes with functional and activities of daily living.   ? ?This patient has experienced inadequate response, adverse effects and/or intolerance with conservative treatments such as acetaminophen, NSAIDS, topical creams, physical therapy or regular exercise, knee bracing and/or weight loss.  ? ?This patient has experienced inadequate response or has a contraindication to intra articular steroid injections for at least 3 months.  ? ?This patient is not scheduled to have a total knee replacement within 6 months of starting treatment with viscosupplementation. ? ? ? ?

## 2021-09-13 NOTE — Telephone Encounter (Signed)
Will submit for gel injection in August, 2023 due to last gel injection being done on 09/08/2021. ?

## 2021-12-27 ENCOUNTER — Other Ambulatory Visit: Payer: Self-pay | Admitting: Internal Medicine

## 2021-12-27 DIAGNOSIS — Z1231 Encounter for screening mammogram for malignant neoplasm of breast: Secondary | ICD-10-CM

## 2022-01-12 ENCOUNTER — Ambulatory Visit
Admission: RE | Admit: 2022-01-12 | Discharge: 2022-01-12 | Disposition: A | Discharge status: Home / Self Care | Payer: Medicare Other | Source: Ambulatory Visit | Attending: Internal Medicine | Admitting: Internal Medicine

## 2022-01-12 DIAGNOSIS — Z1231 Encounter for screening mammogram for malignant neoplasm of breast: Secondary | ICD-10-CM

## 2022-01-19 ENCOUNTER — Encounter: Payer: Self-pay | Admitting: Nurse Practitioner

## 2022-01-19 ENCOUNTER — Ambulatory Visit (INDEPENDENT_AMBULATORY_CARE_PROVIDER_SITE_OTHER): Payer: Medicare Other | Admitting: Nurse Practitioner

## 2022-01-19 VITALS — BP 114/66 | HR 68 | Ht 68.5 in | Wt 207.6 lb

## 2022-01-19 DIAGNOSIS — I10 Essential (primary) hypertension: Secondary | ICD-10-CM

## 2022-01-19 DIAGNOSIS — E782 Mixed hyperlipidemia: Secondary | ICD-10-CM

## 2022-01-19 DIAGNOSIS — R600 Localized edema: Secondary | ICD-10-CM | POA: Diagnosis not present

## 2022-01-19 DIAGNOSIS — R002 Palpitations: Secondary | ICD-10-CM

## 2022-01-19 DIAGNOSIS — R0789 Other chest pain: Secondary | ICD-10-CM | POA: Diagnosis not present

## 2022-01-19 NOTE — Patient Instructions (Signed)
Medication Instructions:  Your physician recommends that you continue on your current medications as directed. Please refer to the Current Medication list given to you today.   *If you need a refill on your cardiac medications before your next appointment, please call your pharmacy*   Lab Work: NONE ordered at this time of appointment   If you have labs (blood work) drawn today and your tests are completely normal, you will receive your results only by: [place] (if you have MyChart) OR A paper copy in the mail If you have any lab test that is abnormal or we need to change your treatment, we will call you to review the results.   Testing/Procedures: NONE ordered at this time of appointment    Follow-Up: At [facility], you and your health needs are our priority.  As part of our continuing mission to provide you with exceptional heart care, we have created designated Provider Care Teams.  These Care Teams include your primary Cardiologist (physician) and Advanced Practice Providers (APPs -  Physician Assistants and Nurse Practitioners) who all work together to provide you with the care you need, when you need it.  We recommend signing up for the patient portal called "MyChart".  Sign up information is provided on this After Visit Summary.  MyChart is used to connect with patients for Virtual Visits (Telemedicine).  Patients are able to view lab/test results, encounter notes, upcoming appointments, etc.  Non-urgent messages can be sent to your provider as well.   To learn more about what you can do with MyChart, go to [email or web address].    Your next appointment:   6 month(s)  The format for your next appointment:   In Person  Provider:   Minus Breeding, MD     Other Instructions   Important Information About Sugar

## 2022-01-19 NOTE — Progress Notes (Signed)
Office Visit    Patient Name: [name] Date of Encounter: 01/19/2022  Primary Care Provider:  [name], MD Primary Cardiologist:  Minus Breeding, MD  Chief Complaint    66 year old female with a history of palpitations, chest pain, hypertension, bilateral lower extremity edema, hyperlipidemia, migraines, type 2 diabetes, and OSA on CPAP who presents for follow-up related to palpitations.  Past Medical History    Past Medical History:  Diagnosis Date   Atrial fibrillation ([place])    Complication of anesthesia    " I have to be awake to be intubated because I have a mass in my throut "   Diabetes mellitus    Difficult intubation    mass in throut   Diverticulitis    Hyperlipidemia    Hypertension    Migraines 2013   Sleep apnea    CPAP   Past Surgical History:  Procedure Laterality Date   ABDOMINAL HYSTERECTOMY     GALLBLADDER SURGERY     KNEE ARTHROSCOPY     lymph nodectomy     NASAL SINUS SURGERY     SPLENIC ARTERY EMBOLIZATION     due to Aneurysm   TONSILLECTOMY      Allergies  Allergies  Allergen Reactions   Aspirin Anaphylaxis   Bee Venom Anaphylaxis   Demerol [Meperidine] Anaphylaxis   Ibuprofen Anaphylaxis   Meperidine Hcl Anaphylaxis   Nsaids Anaphylaxis   Iohexol Other (See Comments) and Hypertension    ELEVATION OF B.P, PASSED OUT, PT. NEEDS PRE-MEDS FOR IODINE CONTRAST    Clindamycin/Lincomycin    Codeine Nausea And Vomiting   Ivp Dye [Iodinated Contrast Media] Hypertension    Flushing, also   Mupirocin Other (See Comments)    Makes more inflamed   Neosporin [Neomycin-Bacitracin Zn-Polymyx] Other (See Comments)    Makes more inflammed   Pollen Extract Other (See Comments)    Stuffy nose   Sulfonamide Derivatives Hives   Tramadol Nausea And Vomiting   Cephalosporins Rash and Itching   Penicillins Rash    Has patient had a PCN reaction causing immediate rash, facial/tongue/throat swelling, SOB or lightheadedness with  hypotension: Yes Has patient had a PCN reaction causing severe rash involving mucus membranes or skin necrosis: No Has patient had a PCN reaction that required hospitalization: No Has patient had a PCN reaction occurring within the last 10 years: No If all of the above answers are "NO", then may proceed with Cephalosporin use.     History of Present Illness    66 year old female with the above past medical history including palpitations, chest pain, hypertension, bilateral lower extremity edema, hyperlipidemia, migraines, type 2 diabetes, and OSA on CPAP.  She was initially evaluated by cardiology in March 2015 during a hospitalization for the evaluation of chest pain.  Her symptoms were atypical.  Follow-up stress testing was unremarkable for any evidence of ischemia.  Echocardiogram in 2017 showed no significant abnormalities.  She was evaluated in the emergency room in December 2022 with palpitations.  She was concerned that she was in atrial fibrillation.  However, she was noted to be in normal sinus rhythm.  She was noted to have PVCs.  She denies any symptoms concerning for angina.  She was last seen in the office on 07/02/2021 able overall from a cardiac standpoint.  She did note intermittent palpitations.  She was advised to take her beta-blocker in the evening given that it was the time of day when she most noticed her symptoms.  He was  advised to notify our office if her palpitations became more persistent or severe.  She presents today for follow-up.  Since her last visit she has done well from a cardiac standpoint.  She has noticed great improvement in her palpitations.  She is active and is swimming up to 1 mile daily.  She does note occasional dizziness when she swims.  She has discussed with this with her primary care provider and thinks this is likely attributed to exercising without eating or drinking prior to exercise.  She denies any other dizziness, presyncope, syncope.  Denies  symptoms concerning for angina.  Overall, she reports feeling well and denies any new concerns today.  Home Medications    Current Outpatient Medications  Medication Sig Dispense Refill   acetaminophen (TYLENOL) 500 MG tablet Take 500 mg by mouth every 6 (six) hours as needed (for pain).     albuterol (VENTOLIN HFA) 108 (90 Base) MCG/ACT inhaler Inhale 2 puffs into the lungs every 6 (six) hours as needed for wheezing or shortness of breath.      azelastine (ASTELIN) 0.1 % nasal spray Place 2 sprays into both nostrils 2 (two) times daily. 30 mL 0   clonazePAM (KLONOPIN) 0.5 MG tablet Take 0.5 mg by mouth at bedtime. For sleep     CRESTOR 20 MG tablet Take 10 mg by mouth daily.     diltiazem (DILACOR XR) 180 MG 24 hr capsule DILT-XR 180 mg capsule, extended release  TAKE 1 CAPSULE BY MOUTH EVERY DAY     diphenhydrAMINE (BENADRYL) 50 MG tablet Take 1 tablet (50 mg total) by mouth once for 1 dose. 1 tablet 0   EPINEPHrine 0.3 mg/0.3 mL IJ SOAJ injection Inject 0.3 mLs as directed as needed (for an anaphylactic reaction).      esomeprazole (NEXIUM) 40 MG capsule Take 40 mg by mouth daily.     Eyelid Cleansers (AVENOVA) 0.01 % SOLN Place 1 application into the right eye daily.      famotidine (PEPCID) 40 MG tablet      fluticasone (FLONASE) 50 MCG/ACT nasal spray Place 1 spray into both nostrils daily as needed for allergies or rhinitis.      Fluticasone-Salmeterol (ADVAIR) 500-50 MCG/DOSE AEPB Inhale 1 puff into the lungs daily as needed (for shortness of breath).      furosemide (LASIX) 40 MG tablet Take 40 mg by mouth daily.      methocarbamol (ROBAXIN) 500 MG tablet Take 500 mg by mouth 3 (three) times daily as needed for muscle spasms.     metoprolol tartrate (LOPRESSOR) 25 MG tablet TAKE 1 TABLET (25 MG TOTAL) BY MOUTH DAILY. 90 tablet 3   montelukast (SINGULAIR) 10 MG tablet Take 10 mg by mouth at bedtime.      Multiple Vitamin (MULTIVITAMIN WITH MINERALS) TABS tablet Take 1 tablet by mouth  daily.     omeprazole (PRILOSEC) 20 MG capsule Take 1 capsule (20 mg total) by mouth daily. 30 capsule 0   ondansetron (ZOFRAN) 4 MG tablet Take 1 tablet (4 mg total) by mouth every 6 (six) hours as needed for nausea. 20 tablet 0   ramipril (ALTACE) 10 MG capsule Take 10 mg by mouth daily.     RESTASIS 0.05 % ophthalmic emulsion Place 1 drop into both eyes 2 (two) times daily.  3   saccharomyces boulardii (FLORASTOR) 250 MG capsule Take 1 capsule (250 mg total) by mouth 2 (two) times daily. (Patient taking differently: Take 250 mg by mouth daily.) 60  capsule 0   sucralfate (CARAFATE) 1 g tablet Take 1 tablet (1 g total) by mouth 4 (four) times daily -  with meals and at bedtime. 60 tablet 0   No current facility-administered medications for this visit.     Review of Systems    She denies chest pain, palpitations, dyspnea, pnd, orthopnea, n, v, dizziness, syncope, weight gain, or early satiety. All other systems reviewed and are otherwise negative except as noted above.   Physical Exam    VS:  BP 114/66   Pulse 68   Ht 5' 8.5" (1.74 m)   Wt 207 lb 9.6 oz (94.2 kg)   SpO2 97%   BMI 31.11 kg/m   GEN: Well nourished, well developed, in no acute distress. HEENT: normal. Neck: Supple, no JVD, carotid bruits, or masses. Cardiac: RRR, no murmurs, rubs, or gallops. No clubbing, cyanosis, nonpitting bilateral lower extremity edema.  Radials/DP/PT 2+ and equal bilaterally.  Respiratory:  Respirations regular and unlabored, clear to auscultation bilaterally. GI: Soft, nontender, nondistended, BS + x 4. MS: no deformity or atrophy. Skin: warm and dry, no rash. Neuro:  Strength and sensation are intact. Psych: Normal affect.  Accessory Clinical Findings    ECG personally reviewed by me today - No EKG in office today.    Lab Results  Component Value Date   WBC 13.7 (H) 06/05/2021   HGB 14.0 06/05/2021   HCT 43.2 06/05/2021   MCV 97.3 06/05/2021   PLT 336 06/05/2021   Lab Results   Component Value Date   CREATININE 1.07 (H) 06/05/2021   BUN 12 06/05/2021   NA 139 06/05/2021   K 3.7 06/05/2021   CL 102 06/05/2021   CO2 28 06/05/2021   Lab Results  Component Value Date   ALT 44 06/06/2020   AST 55 (H) 06/06/2020   ALKPHOS 81 06/06/2020   BILITOT 0.3 06/06/2020   Lab Results  Component Value Date   CHOL  07/20/2009    170        ATP III CLASSIFICATION:  <200     mg/dL   Desirable  200-239  mg/dL   Borderline High  >=240    mg/dL   High          HDL 43 07/20/2009   LDLCALC  07/20/2009    93        Total Cholesterol/HDL:CHD Risk Coronary Heart Disease Risk Table                     Men   Women  1/2 Average Risk   3.4   3.3  Average Risk       5.0   4.4  2 X Average Risk   9.6   7.1  3 X Average Risk  23.4   11.0        Use the calculated Patient Ratio above and the CHD Risk Table to determine the patient's CHD Risk.        ATP III CLASSIFICATION (LDL):  <100     mg/dL   Optimal  100-129  mg/dL   Near or Above                    Optimal  130-159  mg/dL   Borderline  160-189  mg/dL   High  >190     mg/dL   Very High   TRIG 170 (H) 07/20/2009   CHOLHDL 4.0 07/20/2009    Lab Results  Component Value  Date   HGBA1C  07/20/2009    6.0 (NOTE) The ADA recommends the following therapeutic goal for glycemic control related to Hgb A1c measurement: Goal of therapy: <6.5 Hgb A1c  Reference: American Diabetes Association: Clinical Practice Recommendations 2010, Diabetes Care, 2010, 33: (Suppl  1).    Assessment & Plan   1.  Palpitations: She notes rare fleeting palpitations, overall much improved.  Continue metoprolol, diltiazem.  2.  H/o atypical chest pain: Stress test  in 2020 was negative.  She denies any recent symptoms concerning for angina. No indication for ischemic evaluation.  3. Hypertension: BP well controlled. Continue current antihypertensive regimen.   4. Bilateral lower extremity edema: She has nonpitting bilateral lower extremity  edema, stable, chronic.  Normal echo. Denies dyspnea, PND, orthopnea, weight gain.  Continue Lasix.  5.  Hyperlipidemia: LDL was 56 in July 2023.  Monitored and managed per PCP.  Continue Crestor.  6.  Disposition: Follow-up in 6 months.  Lenna Sciara, NP 01/19/2022, 12:36 PM

## 2022-01-30 ENCOUNTER — Encounter (HOSPITAL_BASED_OUTPATIENT_CLINIC_OR_DEPARTMENT_OTHER): Payer: Self-pay

## 2022-01-30 ENCOUNTER — Other Ambulatory Visit: Payer: Self-pay

## 2022-01-30 DIAGNOSIS — R109 Unspecified abdominal pain: Secondary | ICD-10-CM | POA: Diagnosis present

## 2022-01-30 DIAGNOSIS — I10 Essential (primary) hypertension: Secondary | ICD-10-CM | POA: Insufficient documentation

## 2022-01-30 DIAGNOSIS — E119 Type 2 diabetes mellitus without complications: Secondary | ICD-10-CM | POA: Diagnosis not present

## 2022-01-30 DIAGNOSIS — K529 Noninfective gastroenteritis and colitis, unspecified: Secondary | ICD-10-CM | POA: Insufficient documentation

## 2022-01-30 DIAGNOSIS — Z79899 Other long term (current) drug therapy: Secondary | ICD-10-CM | POA: Diagnosis not present

## 2022-01-30 LAB — CBC
HCT: 42.2 % (ref 36.0–46.0)
Hemoglobin: 14.1 g/dL (ref 12.0–15.0)
MCH: 31.3 pg (ref 26.0–34.0)
MCHC: 33.4 g/dL (ref 30.0–36.0)
MCV: 93.6 fL (ref 80.0–100.0)
Platelets: 267 10*3/uL (ref 150–400)
RBC: 4.51 MIL/uL (ref 3.87–5.11)
RDW: 13.4 % (ref 11.5–15.5)
WBC: 11.1 10*3/uL — ABNORMAL HIGH (ref 4.0–10.5)
nRBC: 0 % (ref 0.0–0.2)

## 2022-01-30 NOTE — ED Triage Notes (Signed)
Pt reports N/V/D and BIL LQ abd pain since yesterday. H/x diverticulitis.

## 2022-01-31 ENCOUNTER — Emergency Department (HOSPITAL_BASED_OUTPATIENT_CLINIC_OR_DEPARTMENT_OTHER): Payer: Medicare Other

## 2022-01-31 ENCOUNTER — Emergency Department (HOSPITAL_BASED_OUTPATIENT_CLINIC_OR_DEPARTMENT_OTHER)
Admission: EM | Admit: 2022-01-31 | Discharge: 2022-01-31 | Disposition: A | Discharge status: Home / Self Care | Payer: Medicare Other | Attending: Emergency Medicine | Admitting: Emergency Medicine

## 2022-01-31 DIAGNOSIS — K529 Noninfective gastroenteritis and colitis, unspecified: Secondary | ICD-10-CM

## 2022-01-31 LAB — COMPREHENSIVE METABOLIC PANEL
ALT: 15 U/L (ref 0–44)
AST: 16 U/L (ref 15–41)
Albumin: 4 g/dL (ref 3.5–5.0)
Alkaline Phosphatase: 62 U/L (ref 38–126)
Anion gap: 9 (ref 5–15)
BUN: 12 mg/dL (ref 8–23)
CO2: 29 mmol/L (ref 22–32)
Calcium: 9.3 mg/dL (ref 8.9–10.3)
Chloride: 103 mmol/L (ref 98–111)
Creatinine, Ser: 1.1 mg/dL — ABNORMAL HIGH (ref 0.44–1.00)
GFR, Estimated: 55 mL/min — ABNORMAL LOW (ref >60)
Glucose, Bld: 110 mg/dL — ABNORMAL HIGH (ref 70–99)
Potassium: 3.4 mmol/L — ABNORMAL LOW (ref 3.5–5.1)
Sodium: 141 mmol/L (ref 135–145)
Total Bilirubin: 0.6 mg/dL (ref 0.3–1.2)
Total Protein: 7 g/dL (ref 6.5–8.1)

## 2022-01-31 LAB — URINALYSIS, ROUTINE W REFLEX MICROSCOPIC
Bilirubin Urine: NEGATIVE
Glucose, UA: NEGATIVE mg/dL
Hgb urine dipstick: NEGATIVE
Ketones, ur: NEGATIVE mg/dL
Nitrite: NEGATIVE
Specific Gravity, Urine: 1.027 (ref 1.005–1.030)
pH: 6 (ref 5.0–8.0)

## 2022-01-31 LAB — LIPASE, BLOOD: Lipase: 18 U/L (ref 11–51)

## 2022-01-31 MED ORDER — MORPHINE SULFATE (PF) 4 MG/ML IV SOLN
4.0000 mg | Freq: Once | INTRAVENOUS | Status: AC
  Administered 2022-01-31: 4 mg via INTRAVENOUS
  Filled 2022-01-31: qty 1

## 2022-01-31 MED ORDER — ONDANSETRON HCL 4 MG/2ML IJ SOLN
4.0000 mg | Freq: Once | INTRAMUSCULAR | Status: AC
  Administered 2022-01-31: 4 mg via INTRAVENOUS
  Filled 2022-01-31: qty 2

## 2022-01-31 MED ORDER — ONDANSETRON 4 MG PO TBDP: 4.0000 mg | ORAL_TABLET | Freq: Three times a day (TID) | ORAL | 0 refills | Status: DC | PRN

## 2022-01-31 MED ORDER — METRONIDAZOLE 500 MG PO TABS: 500.0000 mg | ORAL_TABLET | Freq: Two times a day (BID) | ORAL | 0 refills | Status: DC

## 2022-01-31 NOTE — Discharge Instructions (Signed)
You were seen today for abdominal pain and diarrhea.  You have evidence of colitis.  This could be viral or inflammatory.  You will be sent home with Flagyl and Zofran for your symptoms.  If symptoms worsen, you should be reevaluated.

## 2022-01-31 NOTE — ED Notes (Signed)
Patient transported to CT 

## 2022-01-31 NOTE — ED Notes (Signed)
Patient Tolerated PO Challenge (Ginger Ale with Ice). Some Nausea afterwards but No Emesis.

## 2022-01-31 NOTE — ED Notes (Signed)
RN provided AVS using Teachback Method. Patient verbalizes understanding of Discharge Instructions. Opportunity for Questioning and Answers were provided by RN. Patient Discharged from ED in Wheelchair to Home with Family.  

## 2022-01-31 NOTE — ED Provider Notes (Signed)
[facility] EMERGENCY DEPT Provider Note   CSN: [id number] Arrival date & time: 01/30/22  2308     History  Chief Complaint  Patient presents with   Abdominal Pain   Emesis   Diarrhea    [name] is a 66 y.o. female.  HPI     This is a 66 year old female with a history of diverticulitis who presents with abdominal pain, diarrhea, nausea and vomiting.  Patient reports that she has had over 24 hours of worsening abdominal pain.  She states that mostly the pain is all over but worsens in the lower quadrants.  She has had nonbloody, nonbilious emesis and nonbloody diarrhea as well.  No recent travel or antibiotic use.  She does have a history of diverticulitis but states that that normally is in her left lower quadrant.  She has not had any fevers.  Home Medications Prior to Admission medications   Medication Sig Start Date End Date Taking? Authorizing Provider  metroNIDAZOLE (FLAGYL) 500 MG tablet Take 1 tablet (500 mg total) by mouth 2 (two) times daily. 01/31/22  Yes Camylle Whicker, Barbette Hair, MD  ondansetron (ZOFRAN-ODT) 4 MG disintegrating tablet Take 1 tablet (4 mg total) by mouth every 8 (eight) hours as needed for nausea or vomiting. 01/31/22  Yes Shakiya Mcneary, Barbette Hair, MD  acetaminophen (TYLENOL) 500 MG tablet Take 500 mg by mouth every 6 (six) hours as needed (for pain).    [provider]  albuterol (VENTOLIN HFA) 108 (90 Base) MCG/ACT inhaler Inhale 2 puffs into the lungs every 6 (six) hours as needed for wheezing or shortness of breath.  02/08/12   [provider]  azelastine (ASTELIN) 0.1 % nasal spray Place 2 sprays into both nostrils 2 (two) times daily. 02/01/20   Tasia Catchings, Amy V, PA-C  clonazePAM (KLONOPIN) 0.5 MG tablet Take 0.5 mg by mouth at bedtime. For sleep 08/16/13   [provider]  CRESTOR 20 MG tablet Take 10 mg by mouth daily. 08/07/13   [provider]  diltiazem (DILACOR XR) 180 MG 24 hr capsule DILT-XR 180 mg capsule,  extended release  TAKE 1 CAPSULE BY MOUTH EVERY DAY    [provider]  diphenhydrAMINE (BENADRYL) 50 MG tablet Take 1 tablet (50 mg total) by mouth once for 1 dose. 11/22/18 01/19/22  Minus Breeding, MD  EPINEPHrine 0.3 mg/0.3 mL IJ SOAJ injection Inject 0.3 mLs as directed as needed (for an anaphylactic reaction).  02/14/13   [provider]  esomeprazole (NEXIUM) 40 MG capsule Take 40 mg by mouth daily. 02/07/12   [provider]  Eyelid Cleansers (AVENOVA) 0.01 % SOLN Place 1 application into the right eye daily.  08/16/17   [provider]  famotidine (PEPCID) 40 MG tablet  09/25/18   [provider]  fluticasone (FLONASE) 50 MCG/ACT nasal spray Place 1 spray into both nostrils daily as needed for allergies or rhinitis.     [provider]  Fluticasone-Salmeterol (ADVAIR) 500-50 MCG/DOSE AEPB Inhale 1 puff into the lungs daily as needed (for shortness of breath).  02/08/12   [provider]  furosemide (LASIX) 40 MG tablet Take 40 mg by mouth daily.  08/07/13   [provider]  methocarbamol (ROBAXIN) 500 MG tablet Take 500 mg by mouth 3 (three) times daily as needed for muscle spasms. 06/05/13   [provider]  metoprolol tartrate (LOPRESSOR) 25 MG tablet TAKE 1 TABLET (25 MG TOTAL) BY MOUTH DAILY. 07/30/21 01/19/22  Minus Breeding, MD  montelukast (SINGULAIR) 10 MG tablet Take 10 mg by mouth at bedtime.  02/21/15   [provider]  Multiple Vitamin (MULTIVITAMIN WITH MINERALS) TABS tablet Take 1 tablet by mouth daily.    [provider]  omeprazole (PRILOSEC) 20 MG capsule Take 1 capsule (20 mg total) by mouth daily. 06/07/20   Elizardo Chilson, Barbette Hair, MD  ondansetron (ZOFRAN) 4 MG tablet Take 1 tablet (4 mg total) by mouth every 6 (six) hours as needed for nausea. 03/24/16   Theodis Blaze, MD  ramipril (ALTACE) 10 MG capsule Take 10 mg by mouth daily. 01/07/22   [provider]  RESTASIS 0.05 %  ophthalmic emulsion Place 1 drop into both eyes 2 (two) times daily. 08/23/17   [provider]  saccharomyces boulardii (FLORASTOR) 250 MG capsule Take 1 capsule (250 mg total) by mouth 2 (two) times daily. Patient taking differently: Take 250 mg by mouth daily. 03/24/16   Theodis Blaze, MD  sucralfate (CARAFATE) 1 g tablet Take 1 tablet (1 g total) by mouth 4 (four) times daily -  with meals and at bedtime. 06/07/20   Linnette Panella, Barbette Hair, MD      Allergies    Aspirin, Bee venom, Demerol [meperidine], Ibuprofen, Meperidine hcl, Nsaids, Iohexol, Clindamycin/lincomycin, Codeine, Ivp dye [iodinated contrast media], Mupirocin, Neosporin [neomycin-bacitracin zn-polymyx], Pollen extract, Sulfonamide derivatives, Tramadol, Cephalosporins, and Penicillins    Review of Systems   Review of Systems  Constitutional:  Negative for fever.  Gastrointestinal:  Positive for abdominal pain, diarrhea, nausea and vomiting.  All other systems reviewed and are negative.   Physical Exam Updated Vital Signs BP 96/64 (BP Location: Right Arm)   Pulse 68   Temp 98.2 F (36.8 C) (Oral)   Resp 18   Ht 1.74 m (5' 8.5")   Wt 92.1 kg   SpO2 100%   BMI 30.42 kg/m  Physical Exam Vitals and nursing note reviewed.  Constitutional:      Appearance: She is well-developed. She is not ill-appearing.  HENT:     Head: Normocephalic and atraumatic.  Eyes:     Pupils: Pupils are equal, round, and reactive to light.  Cardiovascular:     Rate and Rhythm: Normal rate and regular rhythm.     Heart sounds: Normal heart sounds.  Pulmonary:     Effort: Pulmonary effort is normal. No respiratory distress.     Breath sounds: No wheezing.  Abdominal:     General: Bowel sounds are normal.     Palpations: Abdomen is soft.     Tenderness: There is generalized abdominal tenderness. There is no guarding or rebound.  Musculoskeletal:     Cervical back: Neck supple.  Skin:    General: Skin is warm and dry.   Neurological:     General: No focal deficit present.     Mental Status: She is alert and oriented to person, place, and time.  Psychiatric:        Mood and Affect: Mood normal.     ED Results / Procedures / Treatments   Labs (all labs ordered are listed, but only abnormal results are displayed) Labs Reviewed  COMPREHENSIVE METABOLIC PANEL - Abnormal; Notable for the following components:      Result Value   Potassium 3.4 (*)    Glucose, Bld 110 (*)    Creatinine, Ser 1.10 (*)    GFR, Estimated 55 (*)    All other components within normal limits  CBC - Abnormal; Notable for the following  components:   WBC 11.1 (*)    All other components within normal limits  URINALYSIS, ROUTINE W REFLEX MICROSCOPIC - Abnormal; Notable for the following components:   Protein, ur TRACE (*)    Leukocytes,Ua MODERATE (*)    Bacteria, UA RARE (*)    Non Squamous Epithelial 0-5 (*)    All other components within normal limits  LIPASE, BLOOD    EKG None  Radiology CT ABDOMEN PELVIS WO CONTRAST  Result Date: 01/31/2022 CLINICAL DATA:  Abdominal pain, nausea vomiting. EXAM: CT ABDOMEN AND PELVIS WITHOUT CONTRAST TECHNIQUE: Multidetector CT imaging of the abdomen and pelvis was performed following the standard protocol without IV contrast. RADIATION DOSE REDUCTION: This exam was performed according to the departmental dose-optimization program which includes automated exposure control, adjustment of the mA and/or kV according to patient size and/or use of iterative reconstruction technique. COMPARISON:  CT abdomen pelvis dated 06/03/2020. FINDINGS: Evaluation of this exam is limited in the absence of intravenous contrast. Lower chest: The visualized lung bases are clear. No intra-abdominal free air.  Small free fluid within the pelvis. Hepatobiliary: The liver is unremarkable. No biliary dilatation. Cholecystectomy. Pancreas: Unremarkable. No pancreatic ductal dilatation or surrounding inflammatory  changes. Spleen: Splenectomy. Residual splenic tissue noted in the left upper abdomen. Adrenals/Urinary Tract: The adrenal glands are unremarkable. There is no hydronephrosis or nephrolithiasis on either side. The visualized ureters and urinary bladder appear unremarkable. Stomach/Bowel: There is extensive sigmoid diverticulosis. There is long segment inflammatory changes and thickening involving the mid to distal descending colon as well as sigmoid colon likely representing colitis and less likely acute diverticulitis. Clinical correlation is recommended. No abscess or perforation. No bowel obstruction. The appendix is normal. Vascular/Lymphatic: Mild aortoiliac atherosclerotic disease. The IVC is unremarkable. No portal venous gas. There is no adenopathy. Reproductive: Hysterectomy.  No adnexal masses. Other: Small fat containing umbilical hernia. Musculoskeletal: No acute osseous pathology. IMPRESSION: 1. Colitis involving the mid to distal descending and sigmoid colon versus less likely acute diverticulitis. No abscess or perforation. 2. Aortic Atherosclerosis (ICD10-I70.0). Electronically Signed   By: Anner Crete M.D.   On: 01/31/2022 02:35    Procedures Procedures    Medications Ordered in ED Medications  morphine (PF) 4 MG/ML injection 4 mg (4 mg Intravenous Given 01/31/22 0248)  ondansetron (ZOFRAN) injection 4 mg (4 mg Intravenous Given 01/31/22 0248)    ED Course/ Medical Decision Making/ A&P                           Medical Decision Making Amount and/or Complexity of Data Reviewed Labs: ordered. Radiology: ordered.  Risk Prescription drug management.   This patient presents to the ED for concern of abdominal pain, nausea, vomiting, this involves an extensive number of treatment options, and is a complaint that carries with it a high risk of complications and morbidity.  I considered the following differential and admission for this acute, potentially life threatening condition.   The differential diagnosis includes gastroenteritis, colitis, diverticulitis, appendicitis, SBO  MDM:    This is a 66 year old female who presents with abdominal pain, nausea, vomiting, and diarrhea.  She is nontoxic.  Vital signs are reassuring.  She is afebrile.  She has some diffuse tenderness palpation without signs of peritonitis.  Labs obtained and reviewed.  They appear mostly at the patient's baseline.  Slight hypokalemia to 3.4.  White count of 11.1.  CT scan obtained given pain and patient history.  CT scan is  most consistent with colitis.  Patient denies any risk factors for C. difficile.  No recent travel.  Feel this is most likely viral inflammatory; however cannot rule out bacterial.  She has a penicillin allergy.  Would like to avoid ciprofloxacin.  Will place on Flagyl and provide with Zofran.  Patient was much improved after fluids and pain and nausea medication.  (Labs, imaging, consults)  Labs: I Ordered, and personally interpreted labs.  The pertinent results include: CBC, lipase, urinalysis, CMP  Imaging Studies ordered: I ordered imaging studies including CT scan of the abdomen pelvis I independently visualized and interpreted imaging. I agree with the radiologist interpretation  Additional history obtained from husband at bedside.  External records from outside source obtained and reviewed including prior evaluations  Cardiac Monitoring: The patient was maintained on a cardiac monitor.  I personally viewed and interpreted the cardiac monitored which showed an underlying rhythm of: Sinus rhythm  Reevaluation: After the interventions noted above, I reevaluated the patient and found that they have :resolved  Social Determinants of Health: Lives independently  Disposition: Discharge  Co morbidities that complicate the patient evaluation  Past Medical History:  Diagnosis Date   Atrial fibrillation ([place])    Complication of anesthesia    " I have to be awake to be  intubated because I have a mass in my throut "   Diabetes mellitus    Difficult intubation    mass in throut   Diverticulitis    Hyperlipidemia    Hypertension    Migraines 2013   Sleep apnea    CPAP     Medicines Meds ordered this encounter  Medications   morphine (PF) 4 MG/ML injection 4 mg   ondansetron (ZOFRAN) injection 4 mg   metroNIDAZOLE (FLAGYL) 500 MG tablet    Sig: Take 1 tablet (500 mg total) by mouth 2 (two) times daily.    Dispense:  14 tablet    Refill:  0   ondansetron (ZOFRAN-ODT) 4 MG disintegrating tablet    Sig: Take 1 tablet (4 mg total) by mouth every 8 (eight) hours as needed for nausea or vomiting.    Dispense:  20 tablet    Refill:  0    I have reviewed the patients home medicines and have made adjustments as needed  Problem List / ED Course: Problem List Items Addressed This Visit   None Visit Diagnoses     Colitis    -  Primary                   Final Clinical Impression(s) / ED Diagnoses Final diagnoses:  Colitis    Rx / DC Orders ED Discharge Orders          Ordered    metroNIDAZOLE (FLAGYL) 500 MG tablet  2 times daily        01/31/22 0351    ondansetron (ZOFRAN-ODT) 4 MG disintegrating tablet  Every 8 hours PRN        01/31/22 0351              Merryl Hacker, MD 01/31/22 [phone]

## 2022-01-31 NOTE — ED Notes (Signed)
Attempted to obtain IV access x 2 without success

## 2022-02-21 ENCOUNTER — Other Ambulatory Visit: Payer: Self-pay | Admitting: Gastroenterology

## 2022-03-11 ENCOUNTER — Ambulatory Visit: Payer: Medicare Other | Admitting: Orthopedic Surgery

## 2022-04-04 ENCOUNTER — Ambulatory Visit: Payer: Medicare Other | Admitting: Orthopedic Surgery

## 2022-04-11 ENCOUNTER — Telehealth: Payer: Self-pay

## 2022-04-11 NOTE — Telephone Encounter (Signed)
Yes.  Good to go

## 2022-04-11 NOTE — Telephone Encounter (Signed)
Patient has appt this week for gel. Do we have auth?  See notes from earlier this year.

## 2022-04-13 ENCOUNTER — Other Ambulatory Visit: Payer: Self-pay

## 2022-04-13 DIAGNOSIS — M170 Bilateral primary osteoarthritis of knee: Secondary | ICD-10-CM

## 2022-04-15 ENCOUNTER — Ambulatory Visit: Payer: Medicare Other | Admitting: Orthopedic Surgery

## 2022-05-10 ENCOUNTER — Encounter (HOSPITAL_COMMUNITY): Payer: Self-pay | Admitting: Gastroenterology

## 2022-05-10 NOTE — Progress Notes (Signed)
Attempted to obtain medical history via telephone, unable to reach at this time. HIPAA compliant voicemail message left requesting return call to pre surgical testing department. 

## 2022-05-13 ENCOUNTER — Ambulatory Visit (INDEPENDENT_AMBULATORY_CARE_PROVIDER_SITE_OTHER): Payer: Medicare Other | Admitting: Orthopedic Surgery

## 2022-05-13 ENCOUNTER — Encounter: Payer: Self-pay | Admitting: Orthopedic Surgery

## 2022-05-13 DIAGNOSIS — M1711 Unilateral primary osteoarthritis, right knee: Secondary | ICD-10-CM

## 2022-05-13 DIAGNOSIS — M170 Bilateral primary osteoarthritis of knee: Secondary | ICD-10-CM | POA: Diagnosis not present

## 2022-05-13 DIAGNOSIS — M1712 Unilateral primary osteoarthritis, left knee: Secondary | ICD-10-CM

## 2022-05-13 MED ORDER — LIDOCAINE HCL 1 % IJ SOLN
5.0000 mL | INTRAMUSCULAR | Status: AC | PRN
  Administered 2022-05-13: 5 mL

## 2022-05-13 MED ORDER — HYALURONAN 88 MG/4ML IX SOSY
88.0000 mg | PREFILLED_SYRINGE | INTRA_ARTICULAR | Status: AC | PRN
  Administered 2022-05-13: 88 mg via INTRA_ARTICULAR

## 2022-05-13 NOTE — Progress Notes (Signed)
   Procedure Note  Patient: [name]             Date of Birth: 1955-09-15           MRN: [id number]             Visit Date: 05/13/2022  Procedures: Visit Diagnoses:  1. Primary osteoarthritis of both knees     Large Joint Inj: R knee on 05/13/2022 3:44 PM Indications: pain, joint swelling and diagnostic evaluation Details: 18 G 1.5 in needle, superolateral approach  Arthrogram: No  Medications: 5 mL lidocaine 1 %; 88 mg Hyaluronan 88 MG/4ML Outcome: tolerated well, no immediate complications Procedure, treatment alternatives, risks and benefits explained, specific risks discussed. Consent was given by the patient. Immediately prior to procedure a time out was called to verify the correct patient, procedure, equipment, support staff and site/side marked as required. Patient was prepped and draped in the usual sterile fashion.    Large Joint Inj: L knee on 05/13/2022 3:44 PM Indications: pain, joint swelling and diagnostic evaluation Details: 18 G 1.5 in needle, superolateral approach  Arthrogram: No  Medications: 5 mL lidocaine 1 %; 88 mg Hyaluronan 88 MG/4ML Outcome: tolerated well, no immediate complications Procedure, treatment alternatives, risks and benefits explained, specific risks discussed. Consent was given by the patient. Immediately prior to procedure a time out was called to verify the correct patient, procedure, equipment, support staff and site/side marked as required. Patient was prepped and draped in the usual sterile fashion.     Patient is doing well.  She has lost 45 pounds since February by swimming.  Overall her knees are feeling better.  Follow-up as needed for further cortisone and/or gel injections.

## 2022-05-16 NOTE — H&P (Signed)
History of Present Illness General:         66 year old female,  History of IBS. I last saw her 02/21/2022 and she was feeling a lot better with no GI symptoms.        1 week ago she started having epigastric pain and heartburn. She felt better the next day. The next day she began having pain in her entire mid abdomen that radiated down to her bilateral lower abdomen. She continues to have lower abdominal pain with episodes of diarrhea for the past week. No bowel movement today. She denies rectal bleeding, nausea, vomiting, fever, or chills. She is taking pantoprazole 40 mg once daily. Also takes sucralfate, Mylanta, and Tums as needed. Heartburn has improved. No current epigastric pain. She continues to have bilateral lower abdominal pain and diarrhea. She is worried about colitis flare.        She went to [facility] 01/30/2022 for acute abdominal pain, vomiting, and diarrhea.  Abdominal pelvic CT showed colitis involving the mid to distal descending and sigmoid colon versus less likely diverticulitis. No abscess or perforation.  Slightly elevated WBC 11.1. Potassium 3.4. She was given metronidazole 500 mg tablet twice daily for 7 days and Zofran as needed. Discharged home.        Last colonoscopy 06/2016 was technically difficult due to sigmoid stricture and moderate looping. Moderate diverticulosis and no polyps. Repeat screening in 10 years. Needed procedure in hospital due to history of difficult intubation. She has been scheduled for repeat colonoscopy with Dr. Therisa Doyne 05/17/2022 at [facility].        She takes MiraLAX, Stage manager, and a probiotic. She is not taking dicyclomine.  Current Medications Taking Pantoprazole Sodium 40 MG Tablet Delayed Release 1 tablet Orally twice a day before food Famotidine 40 MG Tablet 1 tablet at bedtime Orally Once a day amLODIPine Besylate 5 MG Tablet 1 tablet Orally Once a day Benefiber(Wheat Dextrin) Powder 1 Tablespoon Orally; Mix in a drink. once a  day clonazePAM 0.5 MG Tablet 0.5 tablet Orally once a day Crestor 10 MG Tablet 1 tablet Orally Once a day Florastor(Saccharomyces boulardii) 250 MG Capsule 1 capsule Orally Twice a day Furosemide 40 MG Tablet 1 tablet Orally Once a day MiraLax 17g Powder 1 capful Orally once a day CPAP Restasis(cycloSPORINE) 0.05 % Emulsion 1 drop int o both eyes Ophthalmic Twice a day Singulair 10 MG Tablet 1 tablet in the evening Orally Once a day Vitamin C 500 MG Tablet 1 tablet Orally Once a day Ramipril 10 MG Capsule 1 capsule Orally Once a day Avenova(Eyelid Cleansers) 0.01 % Solution as directed Externally daily Advair Diskus(Fluticasone-Salmeterol) 100-50 MCG/DOSE Aerosol Powder Breath Activated 1 puff Inhalation as needed , Notes to Pharmacist: as needed Sucralfate 1 GM Tablet 1 tablet on an empty stomach Orally Twice a day Not-Taking Dicyclomine HCl 10 MG Capsule TAKE 1 TABLET BY MOUTH THREE TIMES A DAY AS NEEDED FOR DIARRHEA AND CRAMPING 30 DAY(S) Nystatin 100000 UNIT/ML Suspension 4 ml Mouth/Throat Four times a day Lidocaine HCl 2 % Solution Swish and swallow 1-2 tsp Mouth/Throat every 6 hours as needed; 1 hour before meal Sucralfate 1 GM/10ML Suspension TAKE 10 MLS BY MOUTH ON AN EMPTY STOMACH TWICE A DAY 30 DAY(S) Discontinued Omeprazole 40 MG Capsule Delayed Release TAKE 1 CAPSULE BY MOUTH EVERY DAY 30 MINUTES BEFORE MORNING MEAL Cipro 500 MG Tablet 1 tablet Orally every 12 hrs metroNIDAZOLE 500 MG Tablet 1 tablet Orally Three times a day Omeprazole 20 MG  Capsule Delayed Release 1 capsule 30 minutes before morning meal Orally Once a day , Notes to Pharmacist: prn NexIUM 40 MG Capsule 1 capsule Orally once a day Quinapril HCl 40 MG Tablet 1 tablet Orally Once a day Medication List reviewed and reconciled with the patient Past Medical History      Hypertension, benign.      AWAKE INTUBATION--pt repts req't for awake intubation b/o severe tonsillar tissue.      Hypercholesterolemia.       Reflux.      Anxiety.      Fam hx celiac dis (2 children).      low abd pn 01/2011--?diverticulitis (elev ESR, neg CTA); resolved spontaneously w/out antbx; 03/2011 colon neg exc pan tics; CTA/P 06/2011 neg exc tics; CT-DOCUMENTED sigmoid diverticulitis 10/2017, recurr sx 01/2018 and 05/2019.      Fam hx of colon polyps--colonscopy neg '00, '07, '12 (pan tics), 06/2016.      Epig pn 02/2011; egd 03/2011: bile reflux.      Diverticulosis -- pancolonic tics on 03/2011 colonoscopy.      type II diabetes (exercise and diet controlled).      sleep apnea- uses CPAP.      Lower abn CT 07/2010; CT 03/2016 showed Sigmoid diverticulitis (no abscess); CT 05/2016 showed resolution of diverticulitis. Surgical History       TMJ       EGD       (L) lymph node from neck = benign       colonoscopy       tonsillectomy 08/07/00       sinus 07/02/04       meniscus tear 03/05,03/06       hysterectomy 1982       splenic artery aneurysm with a splenectomy 1991       lysis of adhesions 1993       gallbladder removed 1997       Attempted Tonsillectomy - Unsuccessful 2017       Tonsillectomy- Successful 2020 Family History Father: deceased, colon polyp, colonectomy,partial,, diagnosed with Diabetes, Hypertension Mother: deceased, diagnosed with Hypertension Son(s): alive, fatty liver No family hx of colon cancer or kidney disease. 2 children with celiac disease. Father has recently been dx with Leukemia . Father has colon polyps Son has fatty liver. Social History General:  Tobacco use      cigarettes:  Never smoked     Tobacco history last updated  03/09/2022     Vaping  No EXPOSURE TO PASSIVE SMOKE: no. Alcohol: no. Caffeine: coffee, soda, tea, yes, 2 servings daily. Recreational drug use: no. DIET: no particular dietary program. Exercise: nothing structured, 30 min daily, regularly. OCCUPATION: [employer]Marland Kitchen Allergies Codeine (for allergy): stomach upset - Side Effects Demerol: anaphylaxis -  Allergy Sulfa drugs (for allergy): hives - Allergy Aspirin: anaphylaxis - Allergy Ibuprofen: anaphylaxis - Allergy Cephalosporins: hives - Allergy Iodinated Diagnostic Agents: makes pt pass out - Allergy Polysporin, neosporin: skin irritation - Allergy Clindamycin HCl: mouth and throat swelling - Allergy Penicillins: hives - Allergy Nystatin: hives Hospitalization/Major Diagnostic Procedure tonsilitis 05/07/05 - 05/15/05 sinus infection01/06 01/06 sinus infection 10/05 chest pains 08/2013 possible TIA 02/10/15 diverticulitis 03/2016 None in the past yr, no recent ER visits 08/2020 not in the past year 06/02/20 esophagitis 2022 ER visit for colitis 01/2022 none since ER visit 02/2022 Review of Systems GI PROCEDURE:         Pacemaker/ AICD no.  Artificial heart valves no.  MI/heart attack no.  Abnormal heart rhythm YES.  Angina no.  CVA no.  Hypertension YES.  Hypotension no.  Asthma, COPD  YES.  Sleep apnea YES, using CPAP.  Seizure disorders no.  Artificial joints no.  Severe DJD no.  Diabetes  YES, type II.  Significant headaches no.  Vertigo no.  Depression/anxiety no.  Abnormal bleeding no.  Kidney Disease no.  Liver disease no.  Chance of pregnancy no.  Blood transfusion no  Vital Signs Wt: 197.4, Wt change: -1.4 lbs, Ht: 68.5, BMI: 29.57, Temp: 97.3, Pulse sitting: 68, BP sitting: 114/63. Examination Gastroenterology Exam:        GENERAL APPEARANCE: Well developed, well nourished, no active distress, pleasant, no acute distress.         SCLERA: anicteric.         RESPIRATORY Breath sounds clear to auscultation. No wheezes, rales or rhonchi. Respiration even and unlabored.         CARDIOVASCULAR Normal RRR w/o murmers or gallops. No peripheral edema.         ABDOMEN No masses palpated. Liver and spleen not palpated, normal. Bowel sounds normal, Abdomen not distended; diffuse generalized tenderness in the epigastrium, LUQ, LLQ, and RLQ.Marland Kitchen  Assessments 1. Diarrhea of presumed  infectious origin - A09 (Primary) 2. Abdominal pain - R10.9 3. Colitis - K52.9 4. GERD without esophagitis - K21.9 5. IBS (irritable bowel syndrome) - K58.9    Assessment and Plan:  Abnormal CT Colonoscopy at [facility]

## 2022-05-17 ENCOUNTER — Ambulatory Visit (HOSPITAL_COMMUNITY): Payer: Medicare Other | Admitting: Certified Registered Nurse Anesthetist

## 2022-05-17 ENCOUNTER — Encounter (HOSPITAL_COMMUNITY): Payer: Self-pay | Admitting: Gastroenterology

## 2022-05-17 ENCOUNTER — Other Ambulatory Visit: Payer: Self-pay

## 2022-05-17 ENCOUNTER — Encounter (HOSPITAL_COMMUNITY)
Admission: RE | Disposition: A | Discharge status: Home / Self Care | Payer: Self-pay | Source: Home / Self Care | Attending: Gastroenterology

## 2022-05-17 ENCOUNTER — Ambulatory Visit (HOSPITAL_COMMUNITY)
Admission: RE | Admit: 2022-05-17 | Discharge: 2022-05-17 | Disposition: A | Discharge status: Home / Self Care | Payer: Medicare Other | Attending: Gastroenterology | Admitting: Gastroenterology

## 2022-05-17 ENCOUNTER — Ambulatory Visit (HOSPITAL_BASED_OUTPATIENT_CLINIC_OR_DEPARTMENT_OTHER): Payer: Medicare Other | Admitting: Certified Registered Nurse Anesthetist

## 2022-05-17 DIAGNOSIS — I10 Essential (primary) hypertension: Secondary | ICD-10-CM

## 2022-05-17 DIAGNOSIS — G4730 Sleep apnea, unspecified: Secondary | ICD-10-CM | POA: Diagnosis not present

## 2022-05-17 DIAGNOSIS — K5730 Diverticulosis of large intestine without perforation or abscess without bleeding: Secondary | ICD-10-CM | POA: Diagnosis not present

## 2022-05-17 DIAGNOSIS — K5140 Inflammatory polyps of colon without complications: Secondary | ICD-10-CM | POA: Insufficient documentation

## 2022-05-17 DIAGNOSIS — K648 Other hemorrhoids: Secondary | ICD-10-CM | POA: Insufficient documentation

## 2022-05-17 DIAGNOSIS — R933 Abnormal findings on diagnostic imaging of other parts of digestive tract: Secondary | ICD-10-CM | POA: Insufficient documentation

## 2022-05-17 DIAGNOSIS — E119 Type 2 diabetes mellitus without complications: Secondary | ICD-10-CM | POA: Diagnosis not present

## 2022-05-17 DIAGNOSIS — Z79899 Other long term (current) drug therapy: Secondary | ICD-10-CM | POA: Diagnosis not present

## 2022-05-17 DIAGNOSIS — D123 Benign neoplasm of transverse colon: Secondary | ICD-10-CM

## 2022-05-17 DIAGNOSIS — R197 Diarrhea, unspecified: Secondary | ICD-10-CM | POA: Diagnosis not present

## 2022-05-17 DIAGNOSIS — K219 Gastro-esophageal reflux disease without esophagitis: Secondary | ICD-10-CM | POA: Diagnosis not present

## 2022-05-17 HISTORY — PX: POLYPECTOMY: SHX5525

## 2022-05-17 HISTORY — PX: BIOPSY: SHX5522

## 2022-05-17 HISTORY — PX: COLONOSCOPY WITH PROPOFOL: SHX5780

## 2022-05-17 LAB — GLUCOSE, CAPILLARY: Glucose-Capillary: 98 mg/dL (ref 70–99)

## 2022-05-17 SURGERY — COLONOSCOPY WITH PROPOFOL
Anesthesia: Monitor Anesthesia Care

## 2022-05-17 MED ORDER — LACTATED RINGERS IV SOLN: INTRAVENOUS | Status: DC

## 2022-05-17 MED ORDER — PROPOFOL 1000 MG/100ML IV EMUL
INTRAVENOUS | Status: AC
  Filled 2022-05-17: qty 100

## 2022-05-17 MED ORDER — SODIUM CHLORIDE 0.9 % IV SOLN: INTRAVENOUS | Status: DC

## 2022-05-17 MED ORDER — PROPOFOL 10 MG/ML IV BOLUS
INTRAVENOUS | Status: DC | PRN
  Administered 2022-05-17: 50 mg via INTRAVENOUS
  Administered 2022-05-17 (×2): 20 mg via INTRAVENOUS

## 2022-05-17 MED ORDER — DEXMEDETOMIDINE HCL IN NACL 80 MCG/20ML IV SOLN
INTRAVENOUS | Status: DC | PRN
  Administered 2022-05-17 (×2): 4 ug via BUCCAL

## 2022-05-17 MED ORDER — ONDANSETRON HCL 4 MG/2ML IJ SOLN
INTRAMUSCULAR | Status: DC | PRN
  Administered 2022-05-17: 4 mg via INTRAVENOUS

## 2022-05-17 MED ORDER — PROPOFOL 500 MG/50ML IV EMUL
INTRAVENOUS | Status: DC | PRN
  Administered 2022-05-17: 100 ug/kg/min via INTRAVENOUS

## 2022-05-17 SURGICAL SUPPLY — 22 items

## 2022-05-17 NOTE — Transfer of Care (Signed)
Immediate Anesthesia Transfer of Care Note  Patient: [name]  Procedure(s) Performed: Procedure(s): COLONOSCOPY WITH PROPOFOL (N/A) POLYPECTOMY BIOPSY  Patient Location: PACU  Anesthesia Type:MAC  Level of Consciousness: Patient easily awoken, sedated, comfortable, cooperative, following commands, responds to stimulation.   Airway & Oxygen Therapy: Patient spontaneously breathing, ventilating well, oxygen via simple oxygen mask.  Post-op Assessment: Report given to PACU RN, vital signs reviewed and stable, moving all extremities.   Post vital signs: Reviewed and stable.  Complications: No apparent anesthesia complications  Last Vitals:  Vitals Value Taken Time  BP    Temp    Pulse 59 05/17/22 0838  Resp 12 05/17/22 0838  SpO2 100 % 05/17/22 0838    Last Pain: There were no vitals filed for this visit.       Complications: No notable events documented.

## 2022-05-17 NOTE — Anesthesia Procedure Notes (Signed)
Procedure Name: MAC Date/Time: 05/17/2022 8:07 AM  Performed by: Deliah Boston, CRNAPre-anesthesia Checklist: Patient identified, Emergency Drugs available, Suction available and Patient being monitored Patient Re-evaluated:Patient Re-evaluated prior to induction Oxygen Delivery Method: Simple face mask Preoxygenation: Pre-oxygenation with 100% oxygen Placement Confirmation: positive ETCO2 and breath sounds checked- equal and bilateral Dental Injury: Teeth and Oropharynx as per pre-operative assessment

## 2022-05-17 NOTE — Op Note (Signed)
[facility] Patient Name: [name] Procedure Date: 05/17/2022 MRN: [id number] Attending MD: Ronnette Juniper , MD, [id number] Date of Birth: 1956/03/21 CSN: [id number] Age: 66 Admit Type: Outpatient Procedure:                Colonoscopy Indications:              Last colonoscopy: 2018, Diarrhea, Abnormal CT of                            the GI tract, left sided colitis noted on CT from                            01/2022 Providers:                Ronnette Juniper, MD, Dulcy Fanny, Janee Morn,                            Technician Referring MD:             Steva Ready Avva,MD Medicines:                Monitored Anesthesia Care Complications:            No immediate complications. Estimated blood loss:                            Minimal. Estimated Blood Loss:     Estimated blood loss was minimal. Procedure:                Pre-Anesthesia Assessment:                           - Prior to the procedure, a History and Physical                            was performed, and patient medications and                            allergies were reviewed. The patient's tolerance of                            previous anesthesia was also reviewed. The risks                            and benefits of the procedure and the sedation                            options and risks were discussed with the patient.                            All questions were answered, and informed consent                            was obtained. Prior Anticoagulants: The patient has                            taken no anticoagulant  or antiplatelet agents. ASA                            Grade Assessment: III - A patient with severe                            systemic disease. After reviewing the risks and                            benefits, the patient was deemed in satisfactory                            condition to undergo the procedure.                           After obtaining informed consent, the  colonoscope                            was passed under direct vision. Throughout the                            procedure, the patient's blood pressure, pulse, and                            oxygen saturations were monitored continuously. The                            PCF-HQ190L ([id number]) Olympus colonoscope was                            introduced through the anus and advanced to the the                            terminal ileum. The colonoscopy was performed                            without difficulty. The patient tolerated the                            procedure well. The quality of the bowel                            preparation was good. The terminal ileum, ileocecal                            valve, appendiceal orifice, and rectum were                            photographed. Scope In: 8:11:17 AM Scope Out: 8:32:24 AM Scope Withdrawal Time: 0 hours 12 minutes 7 seconds  Total Procedure Duration: 0 hours 21 minutes 7 seconds  Findings:      The perianal and digital rectal examinations were normal.      The terminal ileum appeared normal.      A 7 mm polyp was found in  the descending colon. The polyp was sessile.       The polyp was removed with a piecemeal technique using a cold biopsy       forceps. Resection and retrieval were complete.      The sigmoid colon was moderately tortuous.      Multiple large-mouthed and medium-mouthed diverticula were found in the       sigmoid colon, descending colon and transverse colon.      Biopsies for histology were taken with a cold forceps for evaluation of       microscopic colitis.      Non-bleeding internal hemorrhoids were found during retroflexion. Impression:               - The examined portion of the ileum was normal.                           - One 7 mm polyp in the descending colon, removed                            piecemeal using a cold biopsy forceps. Resected and                            retrieved.                            - Tortuous colon.                           - Diverticulosis in the sigmoid colon, in the                            descending colon and in the transverse colon.                           - Non-bleeding internal hemorrhoids.                           - Biopsies were taken with a cold forceps for                            evaluation of microscopic colitis. Moderate Sedation:      Patient did not receive moderate sedation for this procedure, but       instead received monitored anesthesia care. Recommendation:           - Patient has a contact number available for                            emergencies. The signs and symptoms of potential                            delayed complications were discussed with the                            patient. Return to normal activities tomorrow.  Written discharge instructions were provided to the                            patient.                           - High fiber diet.                           - Continue present medications.                           - Await pathology results.                           - Repeat colonoscopy for surveillance based on                            pathology results. Procedure Code(s):        --- Professional ---                           [phone], Colonoscopy, flexible; with biopsy, single                            or multiple Diagnosis Code(s):        --- Professional ---                           K64.8, Other hemorrhoids                           D12.3, Benign neoplasm of transverse colon (hepatic                            flexure or splenic flexure)                           R19.7, Diarrhea, unspecified                           K57.30, Diverticulosis of large intestine without                            perforation or abscess without bleeding                           R93.3, Abnormal findings on diagnostic imaging of                            other parts of digestive tract                            Q43.8, Other specified congenital malformations of                            intestine CPT copyright 2022 American Medical Association. All rights reserved. The codes documented in this report are  preliminary and upon coder review may  be revised to meet current compliance requirements. Ronnette Juniper, MD 05/17/2022 8:38:02 AM This report has been signed electronically. Number of Addenda: 0

## 2022-05-17 NOTE — Discharge Instructions (Signed)
YOU HAD AN ENDOSCOPIC PROCEDURE TODAY: Refer to the procedure report and other information in the discharge instructions given to you for any specific questions about what was found during the examination. If this information does not answer your questions, please call [facility] office at [phone] to clarify.   YOU SHOULD EXPECT: Some feelings of bloating in the abdomen. Passage of more gas than usual. Walking can help get rid of the air that was put into your GI tract during the procedure and reduce the bloating. If you had a lower endoscopy (such as a colonoscopy or flexible sigmoidoscopy) you may notice spotting of blood in your stool or on the toilet paper. Some abdominal soreness may be present for a day or two, also.  DIET: Your first meal following the procedure should be a light meal and then it is ok to progress to your normal diet. A half-sandwich or bowl of soup is an example of a good first meal. Heavy or fried foods are harder to digest and may make you feel nauseous or bloated. Drink plenty of fluids but you should avoid alcoholic beverages for 24 hours. If you had a esophageal dilation, please see attached instructions for diet.    ACTIVITY: Your care partner should take you home directly after the procedure. You should plan to take it easy, moving slowly for the rest of the day. You can resume normal activity the day after the procedure however YOU SHOULD NOT DRIVE, use power tools, machinery or perform tasks that involve climbing or major physical exertion for 24 hours (because of the sedation medicines used during the test).   SYMPTOMS TO REPORT IMMEDIATELY: A gastroenterologist can be reached at any hour. Please call [phone]  for any of the following symptoms:  Following lower endoscopy (colonoscopy, flexible sigmoidoscopy) Excessive amounts of blood in the stool  Significant tenderness, worsening of abdominal pains  Swelling of the abdomen that is new, acute  Fever of 100  or higher  FOLLOW UP:  If any biopsies were taken you will be contacted by phone or by letter within the next 1-3 weeks. Call [phone]  if you have not heard about the biopsies in 3 weeks.  Please also call with any specific questions about appointments or follow up tests. YOU HAD AN ENDOSCOPIC PROCEDURE TODAY: Refer to the procedure report and other information in the discharge instructions given to you for any specific questions about what was found during the examination. If this information does not answer your questions, please call [facility] office at [phone] to clarify.

## 2022-05-17 NOTE — Anesthesia Preprocedure Evaluation (Signed)
Anesthesia Evaluation  Patient identified by MRN, date of birth, ID band Patient awake    Reviewed: Allergy & Precautions, H&P , NPO status , Patient's Chart, lab work & pertinent test results  History of Anesthesia Complications (+) DIFFICULT AIRWAY and history of anesthetic complications  Airway Mallampati: IV  TM Distance: <3 FB Neck ROM: Full    Dental no notable dental hx.    Pulmonary sleep apnea and Continuous Positive Airway Pressure Ventilation    Pulmonary exam normal breath sounds clear to auscultation       Cardiovascular hypertension, Normal cardiovascular exam Rhythm:Regular Rate:Normal     Neuro/Psych negative neurological ROS  negative psych ROS   GI/Hepatic Neg liver ROS,GERD  ,,  Endo/Other  diabetes    Renal/GU negative Renal ROS  negative genitourinary   Musculoskeletal negative musculoskeletal ROS (+)    Abdominal   Peds negative pediatric ROS (+)  Hematology negative hematology ROS (+)   Anesthesia Other Findings   Reproductive/Obstetrics negative OB ROS                             Anesthesia Physical Anesthesia Plan  ASA: 3  Anesthesia Plan: MAC   Post-op Pain Management: Minimal or no pain anticipated   Induction: Intravenous  PONV Risk Score and Plan: 2 and Propofol infusion and Treatment may vary due to age or medical condition  Airway Management Planned: Simple Face Mask  Additional Equipment:   Intra-op Plan:   Post-operative Plan:   Informed Consent: I have reviewed the patients History and Physical, chart, labs and discussed the procedure including the risks, benefits and alternatives for the proposed anesthesia with the patient or authorized representative who has indicated his/her understanding and acceptance.     Dental advisory given  Plan Discussed with: CRNA and Surgeon  Anesthesia Plan Comments:        Anesthesia Quick  Evaluation

## 2022-05-17 NOTE — Anesthesia Postprocedure Evaluation (Signed)
Anesthesia Post Note  Patient: [name]  Procedure(s) Performed: COLONOSCOPY WITH PROPOFOL POLYPECTOMY BIOPSY     Patient location during evaluation: PACU Anesthesia Type: MAC Level of consciousness: awake and alert Pain management: pain level controlled Vital Signs Assessment: post-procedure vital signs reviewed and stable Respiratory status: spontaneous breathing, nonlabored ventilation, respiratory function stable and patient connected to nasal cannula oxygen Cardiovascular status: stable and blood pressure returned to baseline Postop Assessment: no apparent nausea or vomiting Anesthetic complications: no  No notable events documented.  Last Vitals:  Vitals:   05/17/22 0851 05/17/22 0900  BP: (!) 110/56   Pulse: 63 62  Resp: 15 11  Temp:    SpO2: 98% 98%    Last Pain:  Vitals:   05/17/22 0851  TempSrc:   PainSc: 3                  Delena Casebeer S

## 2022-05-18 LAB — SURGICAL PATHOLOGY

## 2022-05-19 ENCOUNTER — Encounter (HOSPITAL_COMMUNITY): Payer: Self-pay | Admitting: Gastroenterology

## 2022-06-17 ENCOUNTER — Encounter: Payer: Self-pay | Admitting: Surgical

## 2022-06-17 ENCOUNTER — Ambulatory Visit (INDEPENDENT_AMBULATORY_CARE_PROVIDER_SITE_OTHER): Payer: Medicare Other | Admitting: Surgical

## 2022-06-17 ENCOUNTER — Ambulatory Visit (INDEPENDENT_AMBULATORY_CARE_PROVIDER_SITE_OTHER): Payer: Medicare Other

## 2022-06-17 DIAGNOSIS — M25552 Pain in left hip: Secondary | ICD-10-CM

## 2022-06-17 DIAGNOSIS — M170 Bilateral primary osteoarthritis of knee: Secondary | ICD-10-CM

## 2022-06-17 DIAGNOSIS — M7050 Other bursitis of knee, unspecified knee: Secondary | ICD-10-CM

## 2022-06-17 DIAGNOSIS — M1712 Unilateral primary osteoarthritis, left knee: Secondary | ICD-10-CM | POA: Diagnosis not present

## 2022-06-17 MED ORDER — BUPIVACAINE HCL 0.25 % IJ SOLN
4.0000 mL | INTRAMUSCULAR | Status: AC | PRN
  Administered 2022-06-17: 4 mL via INTRA_ARTICULAR

## 2022-06-17 MED ORDER — METHYLPREDNISOLONE ACETATE 40 MG/ML IJ SUSP
40.0000 mg | INTRAMUSCULAR | Status: AC | PRN
  Administered 2022-06-17: 40 mg via INTRA_ARTICULAR

## 2022-06-17 MED ORDER — LIDOCAINE HCL 1 % IJ SOLN
5.0000 mL | INTRAMUSCULAR | Status: AC | PRN
  Administered 2022-06-17: 5 mL

## 2022-06-17 NOTE — Progress Notes (Signed)
Follow-up Office Visit Note   Patient: [name]           Date of Birth: [birth year]           MRN: [id number] Visit Date: 06/17/2022 Requested by: [name], MD [address] [place],  [place] [address] PCP: [name], MD  Subjective: Chief Complaint  Patient presents with   Left Knee - Pain    HPI: [name] is a 67 y.o. female who returns to the office for follow-up visit.    Plan at last visit was: Patient is doing well. She has lost 45 pounds since February by swimming. Overall her knees are feeling better. Follow-up as needed for further cortisone and/or gel injections.   Since then, patient notes the gel injections at her last appointment helped her right knee but not her left knee.  Left knee continues to have pain mostly in the medial aspect of the knee.  She states it feels "like bones catching together".  Pain was a little bit worse in the last month.  She also does note some groin pain with anterior thigh pain but the vast majority of her pain is in the knee.  No radicular pain.  No numbness or tingling.  No new injury or falls.              ROS: All systems reviewed are negative as they relate to the chief complaint within the history of present illness.  Patient denies fevers or chills.  Assessment & Plan: Visit Diagnoses:  1. Primary osteoarthritis of both knees   2. Pain of left hip   3. Pes anserine bursitis     Plan: [name] is a 67 y.o. female who returns to the office for follow-up visit for left knee pain.  Plan from last visit was noted above in HPI.  They now return with continued and slightly worsened left knee pain since last visit in early December for gel injections.  Right knee doing well but left knee continues to have primarily medial sided pain.  She has history of knee arthritis.  On exam, mostly has tenderness over the medial joint line and the pes anserine bursa.  Tenderness over the bursa region is actually worse  than the joint line tenderness.  After discussion of options including living with symptoms versus exercise program versus joint injection versus bursa injection, patient would like to proceed with cortisone injection to the left knee today.  Will see how this does over the next week to 2 weeks and if she has some relief but not complete relief then plan for patient to return either for Pez anserine bursa injection under ultrasound or consideration of intra-articular hip injection.  She does have some pain with manipulation of her hip on exam today and mild degenerative changes of the left hip on x-rays today.  Follow-up as needed but she will keep in touch with [place] if she has continued symptoms.  Follow-Up Instructions: No follow-ups on file.   Orders:  Orders Placed This Encounter  Procedures   XR HIP UNILAT W OR W/O PELVIS 2-3 VIEWS LEFT   No orders of the defined types were placed in this encounter.     Procedures: Large Joint Inj: L knee on 06/17/2022 11:42 AM Indications: diagnostic evaluation, joint swelling and pain Details: 18 G 1.5 in needle, superolateral approach  Arthrogram: No  Medications: 5 mL lidocaine 1 %; 40 mg methylPREDNISolone acetate 40 MG/ML; 4 mL bupivacaine  0.25 % Outcome: tolerated well, no immediate complications Procedure, treatment alternatives, risks and benefits explained, specific risks discussed. Consent was given by the patient. Immediately prior to procedure a time out was called to verify the correct patient, procedure, equipment, support staff and site/side marked as required. Patient was prepped and draped in the usual sterile fashion.       Clinical Data: No additional findings.  Objective: Vital Signs: There were no vitals taken for this visit.  Physical Exam:  Constitutional: Patient appears well-developed HEENT:  Head: Normocephalic Eyes:EOM are normal Neck: Normal range of motion Cardiovascular: Normal rate Pulmonary/chest: Effort  normal Neurologic: Patient is alert Skin: Skin is warm Psychiatric: Patient has normal mood and affect  Ortho Exam: Ortho exam demonstrates left knee with no significant effusion.  No calf tenderness.  Negative Homans' sign.  Able to perform straight leg raise without extensor lag.  No tenderness over the quadricep tendon, patellar tendon, patella.  She has mild lateral joint line tenderness and moderate medial joint line tenderness.  She has moderate to severe tenderness over the pes anserine bursa.  Positive FADIR sign with mild reproduction of groin pain.  No cellulitis or skin changes noted.  Specialty Comments:  No specialty comments available.  Imaging: No results found.   PMFS History: Patient Active Problem List   Diagnosis Date Noted   Palpitations 04/25/2021   Leg swelling 06/30/2019   SOB (shortness of breath) 06/30/2019   Educated about COVID-19 virus infection 06/30/2019   AKI (acute kidney injury) ([place]) 03/20/2016   DM type 2, goal HbA1c < 7% ([facility]) 03/20/2016   Chronic diastolic CHF (congestive heart failure) ([place]) 03/20/2016   Sigmoid diverticulitis 03/20/2016   Diverticulitis 03/20/2016   Leukocytosis 03/19/2016   Chest pain 08/31/2013   CHEST PAIN-UNSPECIFIED 08/04/2009   THYROID NODULE, LEFT 07/30/2009   Diabetes mellitus ([place]) 07/30/2009   HYPERLIPIDEMIA 07/30/2009   Obstructive sleep apnea 07/30/2009   Essential hypertension 07/30/2009   GERD 07/30/2009   Irritable bowel syndrome 07/30/2009   Past Medical History:  Diagnosis Date   Atrial fibrillation ([place])    Complication of anesthesia    " I have to be awake to be intubated because I have a mass in my throut "   Diabetes mellitus    Difficult intubation    mass in throut   Diverticulitis    Hyperlipidemia    Hypertension    Migraines 2013   Sleep apnea    CPAP    Family History  Problem Relation Age of Onset   Diabetes Father    Hypertension Father    CAD Daughter 59       CABG X 3     Past Surgical History:  Procedure Laterality Date   ABDOMINAL HYSTERECTOMY     BIOPSY  05/17/2022   Procedure: BIOPSY;  Surgeon: Ronnette Juniper, MD;  Location: [facility];  Service: Gastroenterology;;   COLONOSCOPY WITH PROPOFOL N/A 05/17/2022   Procedure: COLONOSCOPY WITH PROPOFOL;  Surgeon: Ronnette Juniper, MD;  Location: [facility];  Service: Gastroenterology;  Laterality: N/A;   GALLBLADDER SURGERY     KNEE ARTHROSCOPY     lymph nodectomy     NASAL SINUS SURGERY     POLYPECTOMY  05/17/2022   Procedure: POLYPECTOMY;  Surgeon: Ronnette Juniper, MD;  Location: [facility];  Service: Gastroenterology;;   SPLENIC ARTERY EMBOLIZATION     due to Aneurysm   TONSILLECTOMY     Social History   Occupational History   Not on file  Tobacco Use   Smoking status: Never   Smokeless tobacco: Never  Substance and Sexual Activity   Alcohol use: No   Drug use: No   Sexual activity: Yes    Birth control/protection: Surgical    Comment: hysterectomy

## 2022-07-25 ENCOUNTER — Ambulatory Visit (INDEPENDENT_AMBULATORY_CARE_PROVIDER_SITE_OTHER): Payer: Medicare Other | Admitting: Neurology

## 2022-07-25 ENCOUNTER — Encounter: Payer: Self-pay | Admitting: Neurology

## 2022-07-25 VITALS — BP 117/77 | HR 69 | Ht 68.5 in | Wt 213.0 lb

## 2022-07-25 DIAGNOSIS — I5032 Chronic diastolic (congestive) heart failure: Secondary | ICD-10-CM | POA: Diagnosis not present

## 2022-07-25 DIAGNOSIS — G4752 REM sleep behavior disorder: Secondary | ICD-10-CM | POA: Insufficient documentation

## 2022-07-25 DIAGNOSIS — G4733 Obstructive sleep apnea (adult) (pediatric): Secondary | ICD-10-CM

## 2022-07-25 DIAGNOSIS — E119 Type 2 diabetes mellitus without complications: Secondary | ICD-10-CM

## 2022-07-25 DIAGNOSIS — R002 Palpitations: Secondary | ICD-10-CM

## 2022-07-25 MED ORDER — CLONAZEPAM 0.5 MG PO TABS: 1.0000 mg | ORAL_TABLET | Freq: Every day | ORAL | 5 refills | Status: DC

## 2022-07-25 MED ORDER — AMITRIPTYLINE HCL 25 MG PO TABS: 25.0000 mg | ORAL_TABLET | Freq: Every day | ORAL | 3 refills | Status: DC

## 2022-07-25 NOTE — Patient Instructions (Signed)
Clonazepam Tablets What is this medication? CLONAZEPAM (kloe NA ze pam) treats seizures. It may also be used to treat panic disorder. It works by helping your nervous system calm down. It belongs to a group of medications called benzodiazepines. This medicine may be used for other purposes; ask your health care provider or pharmacist if you have questions. COMMON BRAND NAME(S): Ceberclon, Klonopin What should I tell my care team before I take this medication? They need to know if you have any of these conditions: An alcohol or drug abuse problem Bipolar disorder, depression, psychosis or other mental health condition Glaucoma Kidney or liver disease Lung or breathing disease Myasthenia gravis Parkinson disease Porphyria Seizures or a history of seizures Suicidal thoughts An unusual or allergic reaction to clonazepam, other benzodiazepines, foods, dyes, or preservatives Pregnant or trying to get pregnant Breast-feeding How should I use this medication? Take this medication by mouth with a glass of water. Follow the directions on the prescription label. If it upsets your stomach, take it with food or milk. Take your medication at regular intervals. Do not take it more often than directed. Do not stop taking or change the dose except on the advice of your care team. A special MedGuide will be given to you by the pharmacist with each prescription and refill. Be sure to read this information carefully each time. Talk to your care team regarding the use of this medication in children. Special care may be needed. Overdosage: If you think you have taken too much of this medicine contact a poison control center or emergency room at once. NOTE: This medicine is only for you. Do not share this medicine with others. What if I miss a dose? If you miss a dose, take it as soon as you can. If it is almost time for your next dose, take only that dose. Do not take double or extra doses. What may interact  with this medication? Do not take this medication with any of the following: Narcotic medications for cough Sodium oxybate This medication may also interact with the following: Alcohol Antihistamines for allergy, cough and cold Antiviral medications for HIV or AIDS Certain medications for anxiety or sleep Certain medications for depression, like amitriptyline, fluoxetine, sertraline Certain medications for fungal infections like ketoconazole and itraconazole Certain medications for seizures like carbamazepine, phenobarbital, phenytoin, primidone General anesthetics like halothane, isoflurane, methoxyflurane, propofol Local anesthetics like lidocaine, pramoxine, tetracaine Medications that relax muscles for surgery Narcotic medications for pain Phenothiazines like chlorpromazine, mesoridazine, prochlorperazine, thioridazine This list may not describe all possible interactions. Give your health care provider a list of all the medicines, herbs, non-prescription drugs, or dietary supplements you use. Also tell them if you smoke, drink alcohol, or use illegal drugs. Some items may interact with your medicine. What should I watch for while using this medication? Tell your care team if your symptoms do not start to get better or if they get worse. Do not stop taking except on your care team's advice. You may develop a severe reaction. Your care team will tell you how much medication to take. You may get drowsy or dizzy. Do not drive, use machinery, or do anything that needs mental alertness until you know how this medication affects you. To reduce the risk of dizzy and fainting spells, do not stand or sit up quickly, especially if you are an older patient. Alcohol may increase dizziness and drowsiness. Avoid alcoholic drinks. If you are taking another medication that also causes drowsiness, you may   have more side effects. Give your care team a list of all medications you use. Your care team will tell  you how much medication to take. Do not take more medication than directed. Call emergency services if you have problems breathing or unusual sleepiness. The use of this medication may increase the chance of suicidal thoughts or actions. Pay special attention to how you are responding while on this medication. Any worsening of mood, or thoughts of suicide or dying should be reported to your care team right away. What side effects may I notice from receiving this medication? Side effects that you should report to your care team as soon as possible: Allergic reactions--skin rash, itching, hives, swelling of the face, lips, tongue, or throat CNS depression--slow or shallow breathing, shortness of breath, feeling faint, dizziness, confusion, trouble staying awake Thoughts of suicide or self-harm, worsening mood, feelings of depression Side effects that usually do not require medical attention (report to your care team if they continue or are bothersome): Dizziness Drowsiness Headache This list may not describe all possible side effects. Call your doctor for medical advice about side effects. You may report side effects to FDA at 1-800-FDA-1088. Where should I keep my medication? Keep out of the reach of children and pets. This medication can be abused. Keep your medication in a safe place to protect it from theft. Do not share this medication with anyone. Selling or giving away this medication is dangerous and against the law. Store at room temperature between 15 and 30 degrees C (59 and 86 degrees F). Protect from light. Keep container tightly closed. This medication may cause accidental overdose and death if taken by other adults, children, or pets. Mix any unused medication with a substance like cat litter or coffee grounds. Then throw the medication away in a sealed container like a sealed bag or a coffee can with a lid. Do not use the medication after the expiration date. NOTE: This sheet is a  summary. It may not cover all possible information. If you have questions about this medicine, talk to your doctor, pharmacist, or health care provider.  2023 Elsevier/Gold Standard (2020-06-01 00:00:00)

## 2022-07-25 NOTE — Progress Notes (Signed)
SLEEP MEDICINE CLINIC    Provider:  Larey Seat, MD  Primary Care Physician:  [name], [place] [address]     Referring Provider: Prince [name], [place],   [id number]          Chief Complaint according to patient   Patient presents with:     New Patient (Initial Visit)      breakthrough dreaming. She feels very active during the dream causing her to get little rest. She has been diagnosed with REM BD.  Last SS was completed in [place] in 2022. Using a CPAP machine. Here to establish care for CPAP and REM BD. Previously was treated with clonazepam 0.5 mg which previously was helping until last couple years.       Other DME [employer] [facility]. [employer]. Was set up with the [facility] in 2022.   New patient with unusual ENT  history of having tonsils that regrew and obstructed her airway, creating CHF (!) and was told surgery could only be done in [place], [name] in [place] or she needed a trach-       HISTORY OF PRESENT ILLNESS:  [name] is a 67 y.o. female patient who is seen upon referral on 07/25/2022 from [name] for a Sleep consultation> .  Chief concern according to patient : " I  need to have my CPAP followed, it was prescribed by [name], in [place]".   I have the pleasure of seeing [name] 07/25/22 a left-handed female with a possible sleep disorder.    The patient had the first sleep study in the year 2010 ?? At [name] in the [employer] location of [name]-  with a result of severe apnea.    Sleep relevant medical history:  some morning ankle edema, one time nocturia, waking up frequently- vivid dreams - REM BD for about 15 years-  Night terrors, Tonsillectomy twice (!) ,    Family medical /sleep history: No other family member on CPAP with OSA, insomnia, sleep walkers. her daughter is only 32 and has DM type 1, Crohn's, and Soegren's, psoriasis. Had a Stroke and heart  disease.  Son is healthy.    Social history:  Patient is retired from [employer] ,Orthoptist. Hospitalist program administration and HR.  and lives in a household with husband, who is a double amputee-  The patient  used to work in shifts( Presenter, broadcasting,) Pets are not present. Tobacco use: none .  ETOH use : none ,  Caffeine intake in form of Coffee( 3 times a day) . Hobbies/ Exercise  :not regular , loves to read.       Sleep habits are as follows: The patient's dinner time is between 6 PM. The patient goes to bed at 10 PM and continues to sleep for 4.30 hours, wakes for 1 bathroom break.   The preferred sleep position is laterally, with the support of 1 pillows.  CPAP interface is a FFM, Dreams are reportedly more and more frequent/vivid.   The patient wakes up spontaneously 7  AM is the usual rise time. She reports not feeling refreshed or restored in AM, with symptoms such as ( rarely )  morning headaches, and residual fatigue.  Naps are taken frequently, lasting from 60 to 90 minutes and are no more refreshing than nocturnal sleep.     [facility]. Olegario Shearer, MD [facility]. [name] records show an  elevated A1c which was as of November last year 6.5 and in May 2023 6.7 in January 2023 7.1.  Normal free T4 TSH HDL but LDL was elevated.  The [facility] sleep lab report is available to me from 09-10-2020.  The overall AHI was 51.5 this is very high, the apnea index was 27 hypopnea index was 25, oxygen desaturation index was 48/h.  Nadir was 81% under 90% saturation was seen for 33% of total sleep time which makes 164 minutes.  Under 89% saturation of oxygen was present for 18.5% of the total sleep time that was still 92 minutes.  Snoring was described as loud.  This report is based on 2 nights consecutive portable diagnostic home sleep testing.  The patient was put on an autotitration CPAP Luna device with a factory setting between 5 and 20 cmH2O.  Her download from February of this year shows that  she has been using the machine 90% of the time this is with good compliance.  And she has a 95th percentile pressure of 10 cm water, residual AHI under 5 and her usage time is usually around 6 hours at night.  High air leak has not been seen.  She can continue with the current medicated device settings.  She is currently not using a SSRI - she was on effexor, which was weaned by [name].  She is using CLONAZEPAM>      Review of Systems: Out of a complete 14 system review, the patient complains of only the following symptoms, and all other reviewed systems are negative.:   REM BD, anxiety provoking dreams,  MSA ?    I am on CPAP  Fatigue, sleepiness , snoring, dream fragmented sleep, Insomnia, RLS,   How likely are you to doze in the following situations: 0 = not likely, 1 = slight chance, 2 = moderate chance, 3 = high chance   Sitting and Reading? Watching Television? Sitting inactive in a public place (theater or meeting)? As a passenger in a car for an hour without a break? Lying down in the afternoon when circumstances permit? Sitting and talking to someone? Sitting quietly after lunch without alcohol? In a car, while stopped for a few minutes in traffic?   Total = 11/ 24 points   FSS endorsed at 50/ 63 points.   Easily lightheaded, and masked face, rare eye blinking.   Social History   Socioeconomic History   Marital status: Married    Spouse name: Not on file   Number of children: Not on file   Years of education: Not on file   Highest education level: Not on file  Occupational History   Not on file  Tobacco Use   Smoking status: Never   Smokeless tobacco: Never  Substance and Sexual Activity   Alcohol use: No   Drug use: No   Sexual activity: Yes    Birth control/protection: Surgical    Comment: hysterectomy  Other Topics Concern   Not on file  Social History Narrative   Lives husband.  Two children.  Seven grandchildren   Social Determinants of Adult nurse Strain: Not on file  Food Insecurity: Not on file  Transportation Needs: Not on file  Physical Activity: Not on file  Stress: Not on file  Social Connections: Not on file    Family History  Problem Relation Age of Onset   Diabetes Father    Hypertension Father    CAD Daughter 32  CABG X 3    Past Medical History:  Diagnosis Date   Atrial fibrillation ([place])    Complication of anesthesia    " I have to be awake to be intubated because I have a mass in my throut "   Diabetes mellitus    Difficult intubation    mass in throut   Diverticulitis    Hyperlipidemia    Hypertension    Migraines 2013   Sleep apnea    CPAP    Past Surgical History:  Procedure Laterality Date   ABDOMINAL HYSTERECTOMY     BIOPSY  05/17/2022   Procedure: BIOPSY;  Surgeon: Ronnette Juniper, MD;  [facility]: [facility];  Service: Gastroenterology;;   COLONOSCOPY WITH PROPOFOL N/A 05/17/2022   Procedure: COLONOSCOPY WITH PROPOFOL;  Surgeon: Ronnette Juniper, MD;  Location: [facility];  Service: Gastroenterology;  Laterality: N/A;   GALLBLADDER SURGERY     KNEE ARTHROSCOPY     lymph nodectomy     NASAL SINUS SURGERY     POLYPECTOMY  05/17/2022   Procedure: POLYPECTOMY;  Surgeon: Ronnette Juniper, MD;  Location: [facility];  Service: Gastroenterology;;   SPLENIC ARTERY EMBOLIZATION     due to Aneurysm   TONSILLECTOMY       Current Outpatient Medications on File Prior to Visit  Medication Sig Dispense Refill   acetaminophen (TYLENOL) 500 MG tablet Take 500 mg by mouth every 6 (six) hours as needed (for pain).     albuterol (VENTOLIN HFA) 108 (90 Base) MCG/ACT inhaler Inhale 2 puffs into the lungs every 6 (six) hours as needed for wheezing or shortness of breath.      clonazePAM (KLONOPIN) 0.5 MG tablet Take 0.5 mg by mouth at bedtime. For sleep     diltiazem (DILACOR XR) 180 MG 24 hr capsule Take 180 mg by mouth daily.     EPINEPHrine 0.3 mg/0.3 mL IJ SOAJ injection Inject 0.3 mLs as  directed as needed (for an anaphylactic reaction).      Eyelid Cleansers (AVENOVA) 0.01 % SOLN Place 1 application into the right eye daily.      famotidine (PEPCID) 40 MG tablet Take 40 mg by mouth daily.     fluticasone (FLONASE) 50 MCG/ACT nasal spray Place 1 spray into both nostrils daily as needed for allergies or rhinitis.      Fluticasone-Salmeterol (ADVAIR) 500-50 MCG/DOSE AEPB Inhale 1 puff into the lungs daily as needed (for shortness of breath).      furosemide (LASIX) 40 MG tablet Take 40 mg by mouth daily.      metoprolol tartrate (LOPRESSOR) 25 MG tablet Take 25 mg by mouth daily.     montelukast (SINGULAIR) 10 MG tablet Take 10 mg by mouth at bedtime.      Multiple Vitamin (MULTIVITAMIN WITH MINERALS) TABS tablet Take 1 tablet by mouth daily.     pantoprazole (PROTONIX) 40 MG tablet Take 40 mg by mouth daily.     ramipril (ALTACE) 10 MG capsule Take 10 mg by mouth daily.     RESTASIS 0.05 % ophthalmic emulsion Place 1 drop into both eyes 2 (two) times daily.  3   rosuvastatin (CRESTOR) 20 MG tablet Take 10 mg by mouth daily.     No current facility-administered medications on file prior to visit.    Allergies  Allergen Reactions   Aspirin Anaphylaxis   Bee Venom Anaphylaxis   Demerol [Meperidine Hcl] Anaphylaxis   Demerol [Meperidine] Anaphylaxis   Ibuprofen Anaphylaxis   Nsaids Anaphylaxis  Iohexol Other (See Comments) and Hypertension    ELEVATION OF B.P, PASSED OUT, PT. NEEDS PRE-MEDS FOR IODINE CONTRAST    Clindamycin/Lincomycin     Thrush    Codeine Nausea And Vomiting   Ivp Dye [Iodinated Contrast Media] Hypertension    Flushing, also   Mupirocin Other (See Comments)    Makes more inflamed   Neosporin [Neomycin-Bacitracin Zn-Polymyx] Other (See Comments)    Makes more inflammed   Pollen Extract Other (See Comments)    Stuffy nose   Sulfonamide Derivatives Hives   Tramadol Nausea And Vomiting   Cephalosporins Rash and Itching   Nystatin Itching and Rash    Penicillins Rash    Has patient had a PCN reaction causing immediate rash, facial/tongue/throat swelling, SOB or lightheadedness with hypotension: Yes Has patient had a PCN reaction causing severe rash involving mucus membranes or skin necrosis: No Has patient had a PCN reaction that required hospitalization: No Has patient had a PCN reaction occurring within the last 10 years: No If all of the above answers are "NO", then may proceed with Cephalosporin use.      DIAGNOSTIC DATA (LABS, IMAGING, TESTING) - I reviewed patient records, labs, notes, testing and imaging myself where available.   [name] send copies: reviewed.  Lab Results  Component Value Date   WBC 11.1 (H) 01/30/2022   HGB 14.1 01/30/2022   HCT 42.2 01/30/2022   MCV 93.6 01/30/2022   PLT 267 01/30/2022      Component Value Date/Time   NA 141 01/30/2022 2328   NA 142 11/22/2018 1232   K 3.4 (L) 01/30/2022 2328   CL 103 01/30/2022 2328   CO2 29 01/30/2022 2328   GLUCOSE 110 (H) 01/30/2022 2328   BUN 12 01/30/2022 2328   BUN 10 11/22/2018 1232   CREATININE 1.10 (H) 01/30/2022 2328   CALCIUM 9.3 01/30/2022 2328   PROT 7.0 01/30/2022 2328   ALBUMIN 4.0 01/30/2022 2328   AST 16 01/30/2022 2328   ALT 15 01/30/2022 2328   ALKPHOS 62 01/30/2022 2328   BILITOT 0.6 01/30/2022 2328   GFRNONAA 55 (L) 01/30/2022 2328   GFRAA 56 (L) 11/22/2018 1232   Lab Results  Component Value Date   CHOL  07/20/2009    170        ATP III CLASSIFICATION:  <200     mg/dL   Desirable  200-239  mg/dL   Borderline High  >=240    mg/dL   High          HDL 43 07/20/2009   LDLCALC  07/20/2009    93        Total Cholesterol/HDL:CHD Risk Coronary Heart Disease Risk Table                     Men   Women  1/2 Average Risk   3.4   3.3  Average Risk       5.0   4.4  2 X Average Risk   9.6   7.1  3 X Average Risk  23.4   11.0        Use the calculated Patient Ratio above and the CHD Risk Table to determine the patient's CHD  Risk.        ATP III CLASSIFICATION (LDL):  <100     mg/dL   Optimal  100-129  mg/dL   Near or Above  Optimal  130-159  mg/dL   Borderline  160-189  mg/dL   High  >190     mg/dL   Very High   TRIG 170 (H) 07/20/2009   CHOLHDL 4.0 07/20/2009   Lab Results  Component Value Date   HGBA1C  07/20/2009    6.0 (NOTE) The ADA recommends the following therapeutic goal for glycemic control related to Hgb A1c measurement: Goal of therapy: <6.5 Hgb A1c  Reference: American Diabetes Association: Clinical Practice Recommendations 2010, Diabetes Care, 2010, 33: (Suppl  1).   No results found for: "VITAMINB12" Lab Results  Component Value Date   TSH 1.170 04/26/2021    PHYSICAL EXAM:  Today's Vitals   07/25/22 0848  BP: 117/77  Pulse: 69  Weight: 213 lb (96.6 kg)  Height: 5' 8.5" (1.74 m)   Body mass index is 31.92 kg/m.   Wt Readings from Last 3 Encounters:  07/25/22 213 lb (96.6 kg)  05/13/22 198 lb 6.4 oz (90 kg)  01/30/22 203 lb (92.1 kg)     Ht Readings from Last 3 Encounters:  07/25/22 5' 8.5" (1.74 m)  01/30/22 5' 8.5" (1.74 m)  01/19/22 5' 8.5" (1.74 m)      General: The patient is awake, alert and appears not in acute distress. The patient is well groomed. Head: Normocephalic, atraumatic. Neck is supple. Mallampati 3,  neck circumference:16 inches .  Soft mass in the left neck side. Nasal airflow  patent.  Retrognathia is  seen.  Dental status:  Cardiovascular:  Regular rate and cardiac rhythm by pulse,  without distended neck veins. Respiratory: Lungs are clear to auscultation.  Skin:  With evidence of mild ankle edema, no rash. Trunk: The patient's posture is erect.   NEUROLOGIC EXAM: The patient is awake and alert, oriented to place and time.   Memory subjective described as intact.  Attention span & concentration ability appears normal.  Speech is fluent,  without  dysarthria, mild dysphonia, no aphasia.  Mood and affect are  appropriate.   Cranial nerves: no loss of smell or taste reported  Pupils are equal and briskly reactive to light. Funduscopic exam deferred. .  Extraocular movements in vertical and horizontal planes were intact and without nystagmus. No Diplopia. Visual fields by finger perimetry are intact. Hearing was intact to soft voice and finger rubbing.    Facial sensation intact to fine touch.  Facial motor strength is symmetric and tongue midline.  Reduced facial mimick-  Neck ROM : rotation, tilt and flexion extension were normal for age and shoulder shrug was symmetrical.    Motor exam:  Symmetric bulk, tone and ROM.   Normal tone without cog wheeling, symmetrically reduced  grip strength= attributed to arthritis  .   Sensory:  Fine touch and vibration were tested  and  normal.  Proprioception tested in the upper extremities was normal.   Coordination: Rapid alternating movements in the fingers/hands were of normal speed.  The Finger-to-nose maneuver was intact without evidence of ataxia, dysmetria or tremor.   Gait and station: Patient could rise unassisted from a seated position, walked without assistive device.  Stance is of normal width/ base and the patient turned with 3-4 steps.  Toe and heel walk were deferred.  Deep tendon reflexes: in the  upper and lower extremities are symmetric and intact.  Babinski response was deferred .    ASSESSMENT AND PLAN 68 y.o. year old female  here with:    1) 15 year history of REM  sleep behavior disorder and increasing  frequency of nightmarish dreams and enactment. Has fallen out of bed, but has also sleep-walked.  Husband sleeps in the  same bedroom, and witnessed many sleep walking events.  Dream can continue after she gets up, in installments.   Increased klonopin and start SSRI. Plan B would be a tricyclic or imipramine. This would be REM suppressant.    2) Parkinsonism with autonomic features, but also reduced mimick, rare blink,  titubation. There is also a depression.  NO RIGOR or cogwheeling. It would be very important to exercise to overcome the depression and rigidity. I am not sure if the is CHF or dysautonomia.   3)  CPAP user for SEVERE OSA. Continue the current LUNA device and settings. I like to add an ONO on CPAP to make sure no hypoxemia is still present  I plan to follow up either personally or through our NP within 4 months.   I would like to thank [facility]rina Solian, MD and [name], [place],  [place] [address] for allowing me to meet with and to take care of this pleasant patient.   CC: I will share my notes with PCP .  After spending a total time of  60  minutes face to face and additional time for physical and neurologic examination, review of laboratory studies,  personal review of imaging studies, reports and results of other testing and review of referral information / records as far as provided in visit,   Electronically signed by: Katrina Seat, MD 07/25/2022 9:06 AM  [facility] [place] certified by The [employer] of Sleep Medicine and Diplomate of the [employer] of Sleep Medicine. Board certified In Neurology through the [place], Fellow of the [employer] of Neurology. Medical Director of [employer].

## 2022-07-28 ENCOUNTER — Ambulatory Visit (INDEPENDENT_AMBULATORY_CARE_PROVIDER_SITE_OTHER): Payer: Medicare Other | Admitting: Podiatry

## 2022-07-28 DIAGNOSIS — S90212A Contusion of left great toe with damage to nail, initial encounter: Secondary | ICD-10-CM | POA: Diagnosis not present

## 2022-07-28 DIAGNOSIS — S90222A Contusion of left lesser toe(s) with damage to nail, initial encounter: Secondary | ICD-10-CM

## 2022-07-28 NOTE — Progress Notes (Signed)
  Subjective:  Patient ID: [name], female    DOB: 03-28-1956,  MRN: [id number]  Chief Complaint  Patient presents with   Nail Problem    L Great toe bleeding under nail after hitting it.    67 y.o. female presents with the above complaint. History confirmed with patient.  This happened about 2 weeks ago.  It will not stop bleeding.  Objective:  Physical Exam: warm, good capillary refill, no trophic changes or ulcerative lesions, normal DP and PT pulses, normal sensory exam, and supple hematoma involving the entirety of the nail plate on the left great toe, some bleeding distally, nail loose proximally Assessment:   1. Subungual hematoma of foot, left, initial encounter      Plan:  Patient was evaluated and treated and all questions answered.  We reviewed my clinical exam findings.  I discussed with her that she does have a significant subungual hematoma.  I recommended avulsion of the nail plate to inspect the nailbed for laceration.  To palpation she did not have clinical signs of distal phalangeal fracture so we did not take x-rays today.  Following a digital block with lidocaine and Marcaine and sterile prep with Betadine and a Soil scientist was used to remove the left hallux nail plate.  The nailbed was inspected there was no active laceration.  Dressed with povidone iodine ointment that she will use for the next week and dressed with a bandage.  Return as needed if it becomes symptomatic or has further issues with the toe.  Discussed possibility of nail dystrophy or nail not regrowing due to the injury  Return if symptoms worsen or fail to improve.

## 2022-07-28 NOTE — Patient Instructions (Addendum)
Place 1/4 cup of epsom salts in a quart of warm tap water.  Submerge your foot or feet in the solution and soak for 20 minutes.  This soak should be done twice a day.  Next, remove your foot or feet from solution, blot dry the affected area. Apply ointment (povidone-iodine 10%) and cover if instructed by your doctor.   IF YOUR SKIN BECOMES IRRITATED WHILE USING THESE INSTRUCTIONS, IT IS OKAY TO SWITCH TO  WHITE VINEGAR AND WATER.  As another alternative soak, you may use antibacterial soap and water.  Monitor for any signs/symptoms of infection. Call the office immediately if any occur or go directly to the emergency room. Call with any questions/concerns.

## 2022-08-02 ENCOUNTER — Telehealth: Payer: Self-pay | Admitting: Neurology

## 2022-08-02 ENCOUNTER — Encounter: Payer: Self-pay | Admitting: Podiatry

## 2022-08-02 DIAGNOSIS — I5032 Chronic diastolic (congestive) heart failure: Secondary | ICD-10-CM

## 2022-08-02 DIAGNOSIS — G4733 Obstructive sleep apnea (adult) (pediatric): Secondary | ICD-10-CM

## 2022-08-02 DIAGNOSIS — R002 Palpitations: Secondary | ICD-10-CM

## 2022-08-02 DIAGNOSIS — G4752 REM sleep behavior disorder: Secondary | ICD-10-CM

## 2022-08-02 NOTE — Telephone Encounter (Signed)
Discussed with Dr Brett Fairy and per her office notes she wanted to completed a overnight oximetry test while on cpap not a new ss at this time. We will cancel the sleep study for now and I will send a updated order to [facility] so they can set her up with ONO on CPAP to monitor her oxygen level.

## 2022-08-02 NOTE — Telephone Encounter (Signed)
Pt is calling stated she would like to nurse about sleep study. Stated  that Dr Dohmeier told her that the sleep study was not necessary at the time, she received a call from Raquel Sarna trying to schedule a sleep study.

## 2022-08-02 NOTE — Telephone Encounter (Signed)
07/26/22 [employer] [place] no auth req EE  08/01/22 left msg on ans [facility] [place]

## 2022-08-02 NOTE — Telephone Encounter (Signed)
Pt was called and the message from [place], [place] was relayed.

## 2022-08-02 NOTE — Telephone Encounter (Signed)
Noted, thank you

## 2022-08-02 NOTE — Addendum Note (Signed)
Addended by: Gerline Legacy C on: 08/02/2022 04:00 PM   Modules accepted: Orders

## 2022-08-15 ENCOUNTER — Other Ambulatory Visit: Payer: Self-pay | Admitting: Cardiology

## 2022-08-17 ENCOUNTER — Other Ambulatory Visit: Payer: Self-pay | Admitting: Neurology

## 2022-08-24 NOTE — Telephone Encounter (Signed)
Received a form for the pt. Will send to [employer] for them to get the information to complete.

## 2022-08-24 NOTE — Progress Notes (Signed)
Cardiology Office Note   Date:  08/26/2022   ID:  LYNITA [name], DOB 04-16-1956, MRN [id number]  PCP:  [name], MD  Cardiologist:   Minus Breeding, MD   Chief Complaint  Patient presents with   Palpitations      History of Present Illness: [name] is a 67 y.o. female who was referred by [name], MD for evaluation of palpitations and arm pain.      I saw see her in March 2015 when she was briefly hospitalized  For evaluation of chest pain. This was atypical. Follow-up stress testing was unremarkable for any evidence of ischemia.   I saw her in 2016 preop prior to tonsillectomy.  Se also had an echocardiogram in 2017 and there were no significant abnormalities. We saw her last year  for palpitations.  She was treated with beta blocker.    She said that she still have a lot of stress at home taking care of her husband who has had amputations and is now going to start peritoneal dialysis at home.  She continues to get palpitations daily.  These are skipping beats that she notices throughout the day and she can trigger them.  She has not had any presyncope or syncope.  She did try to play pickle ball recently and injured her toe.  She might go for walks.  She does not have any chest pressure, neck or arm discomfort.  She has had some mild increased lower extremity swelling recently.    Past Medical History:  Diagnosis Date   Atrial fibrillation ([place])    Complication of anesthesia    " I have to be awake to be intubated because I have a mass in my throut "   Diabetes mellitus    Difficult intubation    mass in throut   Diverticulitis    Hyperlipidemia    Hypertension    Migraines 2013   Sleep apnea    CPAP    Past Surgical History:  Procedure Laterality Date   ABDOMINAL HYSTERECTOMY     BIOPSY  05/17/2022   Procedure: BIOPSY;  Surgeon: Ronnette Juniper, MD;  Location: [facility];  Service: Gastroenterology;;   COLONOSCOPY WITH PROPOFOL N/A  05/17/2022   Procedure: COLONOSCOPY WITH PROPOFOL;  Surgeon: Ronnette Juniper, MD;  Location: [facility];  Service: Gastroenterology;  Laterality: N/A;   GALLBLADDER SURGERY     KNEE ARTHROSCOPY     lymph nodectomy     NASAL SINUS SURGERY     POLYPECTOMY  05/17/2022   Procedure: POLYPECTOMY;  Surgeon: Ronnette Juniper, MD;  Location: [facility];  Service: Gastroenterology;;   SPLENIC ARTERY EMBOLIZATION     due to Aneurysm   TONSILLECTOMY       Current Outpatient Medications  Medication Sig Dispense Refill   acetaminophen (TYLENOL) 500 MG tablet Take 500 mg by mouth every 6 (six) hours as needed (for pain).     albuterol (VENTOLIN HFA) 108 (90 Base) MCG/ACT inhaler Inhale 2 puffs into the lungs every 6 (six) hours as needed for wheezing or shortness of breath.      clonazePAM (KLONOPIN) 0.5 MG tablet Take 2 tablets (1 mg total) by mouth at bedtime. For sleep REM BD 60 tablet 5   diltiazem (DILACOR XR) 180 MG 24 hr capsule Take 180 mg by mouth daily.     EPINEPHrine 0.3 mg/0.3 mL IJ SOAJ injection Inject 0.3 mLs as directed as needed (for an anaphylactic reaction).  Eyelid Cleansers (AVENOVA) 0.01 % SOLN Place 1 application into the right eye daily.      famotidine (PEPCID) 40 MG tablet Take 40 mg by mouth daily.     fluticasone (FLONASE) 50 MCG/ACT nasal spray Place 1 spray into both nostrils daily as needed for allergies or rhinitis.      Fluticasone-Salmeterol (ADVAIR) 500-50 MCG/DOSE AEPB Inhale 1 puff into the lungs daily as needed (for shortness of breath).      furosemide (LASIX) 40 MG tablet Take 40 mg by mouth daily.      LAGEVRIO 200 MG CAPS capsule 4 capsules Orally every 12 hrs for 5 days     meclizine (ANTIVERT) 25 MG tablet Take 1 tablet po q 6 hours prn Orally as directed for 30 day(s)     methocarbamol (ROBAXIN) 500 MG tablet 1 tablet Orally Every 8 hours as needed for 30 days     montelukast (SINGULAIR) 10 MG tablet Take 10 mg by mouth at bedtime.      Multiple Vitamin  (MULTIVITAMIN WITH MINERALS) TABS tablet Take 1 tablet by mouth daily.     nystatin (MYCOSTATIN) 100000 UNIT/ML suspension SMARTSIG:4 Milliliter(s) By Mouth 4 Times Daily     pantoprazole (PROTONIX) 40 MG tablet Take 40 mg by mouth daily.     RESTASIS 0.05 % ophthalmic emulsion Place 1 drop into both eyes 2 (two) times daily.  3   rosuvastatin (CRESTOR) 20 MG tablet Take 10 mg by mouth daily.     saccharomyces boulardii (FLORASTOR) 250 MG capsule Take one capsule daily Oral     metoprolol tartrate (LOPRESSOR) 25 MG tablet Take 1 tablet (25 mg total) by mouth 2 (two) times daily. 180 tablet 3   ramipril (ALTACE) 5 MG capsule Take 1 capsule (5 mg total) by mouth daily. 90 capsule 3   No current facility-administered medications for this visit.    Allergies:   Aspirin, Bee venom, Demerol [meperidine hcl], Demerol [meperidine], Ibuprofen, Nsaids, Iohexol, Ciprofloxacin, Clindamycin/lincomycin, Codeine, Ivp dye [iodinated contrast media], Mupirocin, Neosporin [neomycin-bacitracin zn-polymyx], Pollen extract, Sulfonamide derivatives, Tramadol, Cephalosporins, Nystatin, and Penicillins    ROS:  Please see the history of present illness.   Otherwise, review of systems are positive for none.   All other systems are reviewed and negative.    PHYSICAL EXAM: VS:  BP 102/60   Pulse 79   Ht 5' 8.5" (1.74 m)   Wt 206 lb (93.4 kg)   SpO2 97%   BMI 30.87 kg/m  , BMI Body mass index is 30.87 kg/m. GENERAL:  Well appearing NECK:  No jugular venous distention, waveform within normal limits, carotid upstroke brisk and symmetric, no bruits, no thyromegaly LUNGS:  Clear to auscultation bilaterally CHEST:  Unremarkable HEART:  PMI not displaced or sustained,S1 and S2 within normal limits, no S3, no S4, no clicks, no rubs, soft apical systolic murmur early peaking heard at the left upper sternal border, no diastolic murmurs ABD:  Flat, positive bowel sounds normal in frequency in pitch, no bruits, no rebound,  no guarding, no midline pulsatile mass, no hepatomegaly, no splenomegaly EXT:  2 plus pulses throughout, no edema, no cyanosis no clubbing   EKG:  EKG is   ordered today. Sinus rhythm, rate 79, axis within normal limits, intervals within normal limits, no acute ST-T wave changes.  Recent Labs: 01/30/2022: ALT 15; BUN 12; Creatinine, Ser 1.10; Hemoglobin 14.1; Platelets 267; Potassium 3.4; Sodium 141    Lipid Panel    Wt Readings from Last  3 Encounters:  08/26/22 206 lb (93.4 kg)  07/25/22 213 lb (96.6 kg)  05/13/22 198 lb 6.4 oz (90 kg)      Other studies Reviewed: Additional studies/ records that were reviewed today include: None Review of the above records demonstrates:  Please see elsewhere in the .     ASSESSMENT AND PLAN:  PALPITATIONS: The patient feels these sporadically but for the most part daily and we will going to increase her metoprolol to 25 mg twice daily.  HTN:   BP is running on the lower side and as I increase her beta-blocker and reduce her Altace to 5 mg daily.  CHEST PAIN: She has had no new chest discomfort.  No further cardiovascular testing.  EDEMA:   She does have some mild increased lower extremity swelling which could be related to calcium channel blocker.  She is going to take an extra few days of 20 mg of Lasix.  She has had normal heart function several years ago on echo.  I would not suggest congestive heart failure.  She will let me know if this gets worse.  She is already limiting salt.   Current medicines are reviewed at length with the patient today.  The patient does not have concerns regarding medicines.  The following changes have been made: As above  Labs/ tests ordered today include:  None  Orders Placed This Encounter  Procedures   EKG 12-Lead     Disposition:   FU with 12  months   Signed, Minus Breeding, MD  08/26/2022 8:34 AM    [place]

## 2022-08-26 ENCOUNTER — Ambulatory Visit: Payer: Medicare Other | Attending: Cardiology | Admitting: Cardiology

## 2022-08-26 ENCOUNTER — Encounter: Payer: Self-pay | Admitting: Cardiology

## 2022-08-26 VITALS — BP 102/60 | HR 79 | Ht 68.5 in | Wt 206.0 lb

## 2022-08-26 DIAGNOSIS — R002 Palpitations: Secondary | ICD-10-CM

## 2022-08-26 MED ORDER — METOPROLOL TARTRATE 25 MG PO TABS: 25.0000 mg | ORAL_TABLET | Freq: Two times a day (BID) | ORAL | 3 refills | Status: DC

## 2022-08-26 MED ORDER — RAMIPRIL 5 MG PO CAPS: 5.0000 mg | ORAL_CAPSULE | Freq: Every day | ORAL | 3 refills | Status: AC

## 2022-08-26 NOTE — Patient Instructions (Signed)
Medication Instructions:  DECREASE ramipril to 5 mg daily INCREASE metoprolol tartrate (Lopressor) to 25 mg two times daily  *If you need a refill on your cardiac medications before your next appointment, please call your pharmacy*  Follow-Up: At [facility], you and your health needs are our priority.  As part of our continuing mission to provide you with exceptional heart care, we have created designated Provider Care Teams.  These Care Teams include your primary Cardiologist (physician) and Advanced Practice Providers (APPs -  Physician Assistants and Nurse Practitioners) who all work together to provide you with the care you need, when you need it.  We recommend signing up for the patient portal called "MyChart".  Sign up information is provided on this After Visit Summary.  MyChart is used to connect with patients for Virtual Visits (Telemedicine).  Patients are able to view lab/test results, encounter notes, upcoming appointments, etc.  Non-urgent messages can be sent to your provider as well.   To learn more about what you can do with MyChart, go to [email or web address].    Your next appointment:   12 month(s)  Provider:   Minus Breeding, MD

## 2022-09-13 ENCOUNTER — Telehealth: Payer: Self-pay | Admitting: Neurology

## 2022-09-13 NOTE — Telephone Encounter (Signed)
Pt called stating that the Amitriptyline HCl 25 mg Oral Daily at bedtime is causing her to be cognitively impaired. She would like to discuss with MD or RN.

## 2022-09-13 NOTE — Telephone Encounter (Signed)
Contacted pt back, she stated she started taking Amitriptyline after last OV and started feeling groggy, having some forgetfulness and some confusion. He husband recently had a accident and she stopped the medication about 2 1/2 weeks ago due to him being in the hospital and she having to drive back and fourth and needed to function. She does feel better since stopping the medication and is doing well off of it. I informed her we can either stop the medication totally or pause it for now until he is home and her day isn't as hectic and try to start her back on it, possible 1/2 tab and titrate up. She agreed to pause it for now and give [place] a call when she would like to possibly restart it. Pt verbally understood and and appreciated the call back.

## 2022-10-04 NOTE — Telephone Encounter (Signed)
Does not qualify for 02 ,

## 2022-10-04 NOTE — Telephone Encounter (Signed)
Pt completed ONO on CPAP on 09/29/2022. Pt oxygen level dipped as low as 85%. There was 8:36 sec recorded where the oxygen was spent below 88%. 24 min 1 sec where the oxygen was below 89%.  I will provide to Dr Vickey Huger for her to review and determine of oxygen is needed for the patient. If so, she would have to come in for a in lab titration to get the oxygen covered through insurance.

## 2022-11-23 ENCOUNTER — Encounter: Payer: Self-pay | Admitting: Neurology

## 2022-11-23 NOTE — Progress Notes (Signed)
No need for 02 supplementation.

## 2022-12-14 ENCOUNTER — Telehealth: Payer: Self-pay | Admitting: Neurology

## 2022-12-14 ENCOUNTER — Other Ambulatory Visit: Payer: Self-pay | Admitting: Internal Medicine

## 2022-12-14 DIAGNOSIS — Z1231 Encounter for screening mammogram for malignant neoplasm of breast: Secondary | ICD-10-CM

## 2022-12-14 NOTE — Telephone Encounter (Signed)
LVM and sent mychart msg informing pt of need to reschedule 01/25/23 appt - MD out

## 2023-01-18 ENCOUNTER — Ambulatory Visit: Payer: Medicare Other

## 2023-01-22 ENCOUNTER — Other Ambulatory Visit: Payer: Self-pay | Admitting: Neurology

## 2023-01-23 ENCOUNTER — Other Ambulatory Visit: Payer: Self-pay | Admitting: Neurology

## 2023-01-23 ENCOUNTER — Other Ambulatory Visit: Payer: Self-pay

## 2023-01-23 ENCOUNTER — Encounter: Payer: Self-pay | Admitting: Neurology

## 2023-01-23 ENCOUNTER — Telehealth: Payer: Self-pay | Admitting: Neurology

## 2023-01-23 NOTE — Telephone Encounter (Signed)
Please see note added in orders only.

## 2023-01-23 NOTE — Telephone Encounter (Signed)
Pt came into office requesting refill of  clonazePAM (KLONOPIN) 0.5 MG tablet at [id number].

## 2023-01-23 NOTE — Progress Notes (Unsigned)
   Last office visit: 07/25/2022 Next office visit: 02/07/2023

## 2023-01-24 ENCOUNTER — Other Ambulatory Visit: Payer: Self-pay | Admitting: Neurology

## 2023-01-24 MED ORDER — CLONAZEPAM 0.5 MG PO TABS: 1.0000 mg | ORAL_TABLET | Freq: Every day | ORAL | 0 refills | Status: DC

## 2023-01-25 ENCOUNTER — Ambulatory Visit: Payer: Medicare Other | Admitting: Neurology

## 2023-01-31 ENCOUNTER — Ambulatory Visit
Admission: RE | Admit: 2023-01-31 | Discharge: 2023-01-31 | Disposition: A | Discharge status: Home / Self Care | Payer: Medicare Other | Source: Ambulatory Visit | Attending: Internal Medicine | Admitting: Internal Medicine

## 2023-01-31 DIAGNOSIS — Z1231 Encounter for screening mammogram for malignant neoplasm of breast: Secondary | ICD-10-CM

## 2023-02-02 ENCOUNTER — Other Ambulatory Visit: Payer: Self-pay | Admitting: Internal Medicine

## 2023-02-02 DIAGNOSIS — R928 Other abnormal and inconclusive findings on diagnostic imaging of breast: Secondary | ICD-10-CM

## 2023-02-07 ENCOUNTER — Ambulatory Visit (INDEPENDENT_AMBULATORY_CARE_PROVIDER_SITE_OTHER): Payer: Medicare Other | Admitting: Neurology

## 2023-02-07 ENCOUNTER — Encounter: Payer: Self-pay | Admitting: Neurology

## 2023-02-07 VITALS — BP 121/68 | HR 69 | Ht 69.0 in | Wt 204.2 lb

## 2023-02-07 DIAGNOSIS — G4752 REM sleep behavior disorder: Secondary | ICD-10-CM | POA: Diagnosis not present

## 2023-02-07 DIAGNOSIS — G4733 Obstructive sleep apnea (adult) (pediatric): Secondary | ICD-10-CM

## 2023-02-07 DIAGNOSIS — I5032 Chronic diastolic (congestive) heart failure: Secondary | ICD-10-CM

## 2023-02-07 MED ORDER — CLONAZEPAM 0.5 MG PO TABS: 1.0000 mg | ORAL_TABLET | Freq: Every day | ORAL | 5 refills | Status: DC

## 2023-02-07 NOTE — Progress Notes (Signed)
Provider:  Melvyn Novas, MD   [facility]    Primary Care Physician:  Chilton Greathouse, MD [address] [place] [address]     Referring Provider: Chilton Greathouse, Md [address] [place],  [place] [address]          Chief Complaint according to patient   Patient presents with:      breakthrough dreaming. She feels very active during the dream causing her to get little rest. She has been diagnosed with REM BD.  Last SS was completed in [place] in 20[place]. Using a CPAP machine. Here to establish care for CPAP and REM BD. Previously was treated with clonazepam 0.5 mg which previously was helping until last couple years.  Here with her CPAP, no Sleep test.            HISTORY OF PRESENT ILLNESS:  [name] is a 67 y.o. female patient who is here for revisit 02/07/2023 for a Rv .  I had the pleasure of meeting [name] for the first time on February 12 of this year when she presented with a longstanding history of sleep behavior disorder treated under clonazepam.  This is still her medication at this time she is also on a Ventolin inhaler she takes occasionally Tylenol Restasis eyedrops are still on her list diltiazem, other nova, Pepcid, Flonase, Advair as needed, Lasix 40 mg daily, Robaxin as needed, metoprolol twice a day 25 mg, Singulair, Protonix, Altace, Crestor, Florastor.  Klonopin is 0.5 mg 2 tablets by mouth at bedtime   She also brought her CPAP to [place] today and her compliance with CPAP is excellent she has used the machine 27 out of 30 days and 26 of these days over 4 hours consecutively.  The average use of time is 7 hours 22 minutes.  Her maximum pressure is 10 minimum pressure of 5 cm water with 2 cm EPR.  She does not have high air leakage the 95th percentile pressure is 9.9 cm water and the average AHI is at residual 3.7/h.  I would like to increase the maximum pressure by 2 cm to reduce the residual apnea index for obstructive events.     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   After 3 months he returned home and she noted she had no REM BD events in the 3 months of his absence. He is changing to home dialysis.       Review of Systems: Out of a complete 14 system review, the patient complains of only the following symptoms, and all other reviewed systems are negative.:  Fatigue, sleepiness , snoring, fragmented sleep, Insomnia, RLS, Nocturia  The geriatric depression score is endorsed at 5 out of 15 points which is a borderline depression score.    How likely are you to doze in the following situations: 0 = not likely, 1 = slight chance, 2 = moderate chance, 3 = high chance   Sitting and Reading? Watching Television? Sitting inactive in a public place (theater or meeting)? As a passenger in a car for an hour without a break? Lying down in the afternoon when circumstances permit? Sitting and talking to someone? Sitting quietly after lunch without alcohol? In a car, while stopped for a few minutes in traffic?   She endorsed the Epworth Sleepiness Scale today at 7 and the  fatigue severity scale at 36 points.  The geriatric depression score is endorsed at 5 out of 15 points which is a borderline depression score.  Social History   Socioeconomic History   Marital status: Married    Spouse name: Not on file   Number of children: Not on file   Years of education: Not on file   Highest education level: Not on file  Occupational History   Not on file  Tobacco Use   Smoking status: Never   Smokeless tobacco: Never  Substance and Sexual Activity   Alcohol use: No   Drug use: No   Sexual activity: Yes    Birth control/protection: Surgical    Comment: hysterectomy  Other Topics Concern   Not on file  Social History Narrative   Lives husband.  Two children.  Seven grandchildren   Social Determinants of Health   Financial  Resource Strain: Not on file  Food Insecurity: Not on file  Transportation Needs: Not on file  Physical Activity: Not on file  Stress: Not on file  Social Connections: Unknown (10/24/2021)   Received [facility]   Social Network    Social Network: Not on file    Family History  Problem Relation Age of Onset   Diabetes Father    Hypertension Father    CAD Daughter 58       CABG X 3    Past Medical History:  Diagnosis Date   Atrial fibrillation (HCC)    Complication of anesthesia    " I have to be awake to be intubated because I have a mass in my throut "   Diabetes mellitus    Difficult intubation    mass in throut   Diverticulitis    Hyperlipidemia    Hypertension    Migraines 2013   Sleep apnea    CPAP    Past Surgical History:  Procedure Laterality Date   ABDOMINAL HYSTERECTOMY     BIOPSY  05/17/2022   Procedure: BIOPSY;  Surgeon: Kerin Salen, MD;  Location: [facility];  Service: Gastroenterology;;   COLONOSCOPY WITH PROPOFOL N/A 05/17/2022   Procedure: COLONOSCOPY WITH PROPOFOL;  Surgeon: Kerin Salen, MD;  Location: [facility];  Service: Gastroenterology;  Laterality: N/A;   GALLBLADDER SURGERY     KNEE ARTHROSCOPY     lymph nodectomy     NASAL SINUS SURGERY     POLYPECTOMY  05/17/2022   Procedure: POLYPECTOMY;  Surgeon: Kerin Salen, MD;  Location: [facility];  Service: Gastroenterology;;   SPLENIC ARTERY EMBOLIZATION     due to Aneurysm   TONSILLECTOMY       Current Outpatient Medications on File Prior to Visit  Medication Sig Dispense Refill   acetaminophen (TYLENOL) 500 MG tablet Take 500 mg by mouth every 6 (six) hours as needed (for pain).     albuterol (VENTOLIN HFA) 108 (90 Base) MCG/ACT inhaler Inhale 2 puffs into the lungs every 6 (six) hours as needed for wheezing or shortness of breath.      clonazePAM (KLONOPIN) 0.5 MG tablet Take 2 tablets (1 mg total) by mouth at bedtime. For sleep REM BD 60 tablet 0   diltiazem  (DILACOR XR) 180 MG 24 hr capsule Take 180 mg by mouth daily.     EPINEPHrine 0.3 mg/0.3 mL IJ SOAJ injection Inject 0.3 mLs as directed as needed (for an anaphylactic reaction).      Eyelid Cleansers (AVENOVA) 0.01 % SOLN Place 1 application into the right  eye daily.      famotidine (PEPCID) 40 MG tablet Take 40 mg by mouth daily.     fluticasone (FLONASE) 50 MCG/ACT nasal spray Place 1 spray into both nostrils daily as needed for allergies or rhinitis.      Fluticasone-Salmeterol (ADVAIR) 500-50 MCG/DOSE AEPB Inhale 1 puff into the lungs daily as needed (for shortness of breath).      furosemide (LASIX) 40 MG tablet Take 40 mg by mouth daily.      LAGEVRIO 200 MG CAPS capsule 4 capsules Orally every 12 hrs for 5 days     meclizine (ANTIVERT) 25 MG tablet Take 1 tablet po q 6 hours prn Orally as directed for 30 day(s)     methocarbamol (ROBAXIN) 500 MG tablet 1 tablet Orally Every 8 hours as needed for 30 days     metoprolol tartrate (LOPRESSOR) 25 MG tablet Take 1 tablet (25 mg total) by mouth 2 (two) times daily. 180 tablet 3   montelukast (SINGULAIR) 10 MG tablet Take 10 mg by mouth at bedtime.      Multiple Vitamin (MULTIVITAMIN WITH MINERALS) TABS tablet Take 1 tablet by mouth daily.     pantoprazole (PROTONIX) 40 MG tablet Take 40 mg by mouth daily.     ramipril (ALTACE) 5 MG capsule Take 1 capsule (5 mg total) by mouth daily. 90 capsule 3   RESTASIS 0.05 % ophthalmic emulsion Place 1 drop into both eyes 2 (two) times daily.  3   rosuvastatin (CRESTOR) 20 MG tablet Take 10 mg by mouth daily.     saccharomyces boulardii (FLORASTOR) 250 MG capsule Take one capsule daily Oral     nystatin (MYCOSTATIN) 100000 UNIT/ML suspension SMARTSIG:4 Milliliter(s) By Mouth 4 Times Daily (Patient not taking: Reported on 02/07/2023)     No current facility-administered medications on file prior to visit.    Allergies  Allergen Reactions   Aspirin Anaphylaxis   Bee Venom Anaphylaxis   Demerol  [Meperidine Hcl] Anaphylaxis   Demerol [Meperidine] Anaphylaxis   Ibuprofen Anaphylaxis   Nsaids Anaphylaxis   Iohexol Other (See Comments) and Hypertension    ELEVATION OF B.P, PASSED OUT, PT. NEEDS PRE-MEDS FOR IODINE CONTRAST    Ciprofloxacin Swelling   Clindamycin/Lincomycin     Thrush    Codeine Nausea And Vomiting   Ivp Dye [Iodinated Contrast Media] Hypertension    Flushing, also   Mupirocin Other (See Comments)    Makes more inflamed   Neosporin [Neomycin-Bacitracin Zn-Polymyx] Other (See Comments)    Makes more inflammed   Pollen Extract Other (See Comments)    Stuffy nose   Sulfonamide Derivatives Hives   Tramadol Nausea And Vomiting   Cephalosporins Rash and Itching   Nystatin Itching and Rash   Penicillins Rash    Has patient had a PCN reaction causing immediate rash, facial/tongue/throat swelling, SOB or lightheadedness with hypotension: Yes Has patient had a PCN reaction causing severe rash involving mucus membranes or skin necrosis: No Has patient had a PCN reaction that required hospitalization: No Has patient had a PCN reaction occurring within the last 10 years: No If all of the above answers are "NO", then may proceed with Cephalosporin use.      DIAGNOSTIC DATA (LABS, IMAGING, TESTING) - I reviewed patient records, labs, notes, testing and imaging myself where available.  Lab Results  Component Value Date   WBC 11.1 (H) 01/30/2022   HGB 14.1 01/30/2022   HCT 42.2 01/30/2022   MCV 93.6 01/30/2022   PLT  267 01/30/2022      Component Value Date/Time   NA 141 01/30/2022 2328   NA 142 11/22/2018 1232   K 3.4 (L) 01/30/2022 2328   CL 103 01/30/2022 2328   CO2 29 01/30/2022 2328   GLUCOSE 110 (H) 01/30/2022 2328   BUN 12 01/30/2022 2328   BUN 10 11/22/2018 1232   CREATININE 1.10 (H) 01/30/2022 2328   CALCIUM 9.3 01/30/2022 2328   PROT 7.0 01/30/2022 2328   ALBUMIN 4.0 01/30/2022 2328   AST 16 01/30/2022 2328   ALT 15 01/30/2022 2328   ALKPHOS 62  01/30/2022 2328   BILITOT 0.6 01/30/2022 2328   GFRNONAA 55 (L) 01/30/2022 2328   GFRAA 56 (L) 11/22/2018 1232   Lab Results  Component Value Date   CHOL  07/20/2009    170        ATP III CLASSIFICATION:  <200     mg/dL   Desirable  454-098  mg/dL   Borderline High  >=119    mg/dL   High          HDL 43 07/20/2009   LDLCALC  07/20/2009    93        Total Cholesterol/HDL:CHD Risk Coronary Heart Disease Risk Table                     Men   Women  1/2 Average Risk   3.4   3.3  Average Risk       5.0   4.4  2 X Average Risk   9.6   7.1  3 X Average Risk  23.4   11.0        Use the calculated Patient Ratio above and the CHD Risk Table to determine the patient's CHD Risk.        ATP III CLASSIFICATION (LDL):  <100     mg/dL   Optimal  147-829  mg/dL   Near or Above                    Optimal  130-159  mg/dL   Borderline  562-130  mg/dL   High  >865     mg/dL   Very High   TRIG 784 (H) 07/20/2009   CHOLHDL 4.0 07/20/2009   Lab Results  Component Value Date   HGBA1C  07/20/2009    6.0 (NOTE) The ADA recommends the following therapeutic goal for glycemic control related to Hgb A1c measurement: Goal of therapy: <6.5 Hgb A1c  Reference: American Diabetes Association: Clinical Practice Recommendations 2010, Diabetes Care, 2010, 33: (Suppl  1).   No results found for: "VITAMINB12" Lab Results  Component Value Date   TSH 1.170 04/26/2021    PHYSICAL EXAM:  Today's Vitals   02/07/23 1042  BP: 121/68  Pulse: 69  Weight: 204 lb 3.2 oz (92.6 kg)  Height: 5\' 9"  (1.753 m)   Body mass index is 30.16 kg/m.   Wt Readings from Last 3 Encounters:  02/07/23 204 lb 3.2 oz (92.6 kg)  08/26/22 206 lb (93.4 kg)  07/25/22 213 lb (96.6 kg)     Ht Readings from Last 3 Encounters:  02/07/23 5\' 9"  (1.753 m)  08/26/22 5' 8.5" (1.74 m)  07/25/22 5' 8.5" (1.74 m)      General: The patient is awake, alert and appears not in acute distress. The patient is well groomed. Head:  Normocephalic, atraumatic. Neck is supple. Mallampati 3,  neck circumference:16 inches .  Soft mass  in the left neck side. Nasal airflow  patent.  Retrognathia is  seen.  Dental status:  Cardiovascular:  Regular rate and cardiac rhythm by pulse,  without distended neck veins. Respiratory: Lungs are clear to auscultation.  Skin:  With evidence of mild ankle edema, no rash. Trunk: The patient's posture is erect.   NEUROLOGIC EXAM: The patient is awake and alert, oriented to place and time.   Memory subjective described as intact.  Attention span & concentration ability appears normal.  Speech is fluent,  without  dysarthria, mild dysphonia, no aphasia.  Mood and affect are appropriate.   Cranial nerves: no loss of smell or taste reported  Pupils are equal and briskly reactive to light. Funduscopic exam deferred. .  Extraocular movements in vertical and horizontal planes were intact and without nystagmus. No Diplopia. Visual fields by finger perimetry are intact. Hearing was intact to soft voice and finger rubbing.    Facial sensation intact to fine touch.  Facial motor strength is symmetric and tongue midline.  Reduced facial mimick-  Neck ROM : rotation, tilt and flexion extension were normal for age and shoulder shrug was symmetrical.    Motor exam:  Symmetric bulk, tone and ROM.   Normal tone without cog wheeling, symmetrically reduced  grip strength= attributed to arthritis  .   Sensory:  Fine touch and vibration were tested  and  normal.  Proprioception tested in the upper extremities was normal.   Coordination: Rapid alternating movements in the fingers/hands were of normal speed.  The Finger-to-nose maneuver was intact without evidence of ataxia, dysmetria or tremor.      ASSESSMENT AND PLAN 67 y.o. year old female  here with:    1) 15 year history of REM sleep behavior disorder and increasing  frequency of nightmarish dreams and enactment. Has fallen out of bed, but has  also sleep-walked.  Husband sleeps in the  same bedroom, and witnessed many sleep walking events.  Dream can continue after she gets up, in installments.    We can further increased klonopin and start SSRI. Plan B would be a tricyclic or imipramine. This would be REM suppressant.      2)  CPAP user for SEVERE OSA. High compliance. Continue the current LUNA device and settings. I like to add an ONO on CPAP to make sure no hypoxemia is still present   3) Depression, caretaker burn out, husband had assured her he would not go on dialysis and now his out-of state siblings got involved and he will go where he never wanted to.  She is also somewhat frustrated.  Her daughter is terminally ill. A 58 year -old Granddaughter is disabled but moved in - and is questionable if she will be able to help with her husband- but in return her own baby is taken care of by patient.   There is no parkinsonism.    I plan to follow up either personally or through our NP within 12 months.   I would like to thank  Chilton Greathouse, Md [address] [place],  [place] [address] for allowing me to meet with and to take care of this pleasant patient.     After spending a total time of  30  minutes face to face and additional time for physical and neurologic examination, review of laboratory studies,  personal review of imaging studies, reports and results of other testing and review of referral information / records as far as provided in visit,  Electronically signed by: Melvyn Novas, MD 02/07/2023 11:12 AM  [facility] Board certified by The [employer] of Sleep Medicine and Diplomate of the [employer] of Sleep Medicine. Board certified In Neurology through the ABPN, Fellow of the [employer] of Neu[employer].

## 2023-02-07 NOTE — Patient Instructions (Signed)
ASSESSMENT AND PLAN 67 y.o. year old female  here with:    1) 15 year history of REM sleep behavior disorder and increasing  frequency of nightmarish dreams and enactment. Has fallen out of bed, but has also sleep-walked.  Husband sleeps in the  same bedroom, and witnessed many sleep walking events.  Dream can continue after she gets up, in installments.    We can further increased klonopin and start SSRI. Plan B would be a tricyclic or imipramine. This would be REM suppressant.      2)  CPAP user for SEVERE OSA. High compliance. Continue the current LUNA device and settings. I like to add an ONO on CPAP to make sure no hypoxemia is still present   3) Depression, caretaker burn out, husband had assured her he would not go on dialysis and now his out-of state siblings got involved and he will go where he never wanted to.  She is also somewhat frustrated.  Her daughter is terminally ill. A 7 year -old Granddaughter is disabled but moved in - and is questionable if she will be able to help with her husband- but in return her own baby is taken care of by patient.   There is no parkinsonism.    I plan to follow up either personally or through our NP within 12 months.   I would like to thank  Avva, Ravisankar, Md

## 2023-02-07 NOTE — Addendum Note (Signed)
Addended by: Melvyn Novas on: 02/07/2023 11:38 AM   Modules accepted: Orders

## 2023-02-24 ENCOUNTER — Other Ambulatory Visit: Payer: Medicare Other

## 2023-03-22 ENCOUNTER — Ambulatory Visit
Admission: RE | Admit: 2023-03-22 | Discharge: 2023-03-22 | Disposition: A | Discharge status: Home / Self Care | Payer: Medicare Other | Source: Ambulatory Visit | Attending: Internal Medicine | Admitting: Internal Medicine

## 2023-03-22 DIAGNOSIS — R928 Other abnormal and inconclusive findings on diagnostic imaging of breast: Secondary | ICD-10-CM

## 2023-03-26 ENCOUNTER — Emergency Department (HOSPITAL_COMMUNITY): Payer: Medicare Other

## 2023-03-26 ENCOUNTER — Other Ambulatory Visit: Payer: Self-pay

## 2023-03-26 ENCOUNTER — Emergency Department (HOSPITAL_COMMUNITY)
Admission: EM | Admit: 2023-03-26 | Discharge: 2023-03-26 | Disposition: A | Discharge status: Home / Self Care | Payer: Medicare Other | Attending: Emergency Medicine | Admitting: Emergency Medicine

## 2023-03-26 ENCOUNTER — Encounter (HOSPITAL_COMMUNITY): Payer: Self-pay

## 2023-03-26 DIAGNOSIS — E876 Hypokalemia: Secondary | ICD-10-CM | POA: Diagnosis not present

## 2023-03-26 DIAGNOSIS — R1032 Left lower quadrant pain: Secondary | ICD-10-CM

## 2023-03-26 LAB — CBC
HCT: 40.3 % (ref 36.0–46.0)
Hemoglobin: 13.4 g/dL (ref 12.0–15.0)
MCH: 31.3 pg (ref 26.0–34.0)
MCHC: 33.3 g/dL (ref 30.0–36.0)
MCV: 94.2 fL (ref 80.0–100.0)
Platelets: 321 10*3/uL (ref 150–400)
RBC: 4.28 MIL/uL (ref 3.87–5.11)
RDW: 14 % (ref 11.5–15.5)
WBC: 8.9 10*3/uL (ref 4.0–10.5)
nRBC: 0 % (ref 0.0–0.2)

## 2023-03-26 LAB — COMPREHENSIVE METABOLIC PANEL
ALT: 14 U/L (ref 0–44)
AST: 16 U/L (ref 15–41)
Albumin: 3.8 g/dL (ref 3.5–5.0)
Alkaline Phosphatase: 66 U/L (ref 38–126)
Anion gap: 8 (ref 5–15)
BUN: 17 mg/dL (ref 8–23)
CO2: 27 mmol/L (ref 22–32)
Calcium: 9 mg/dL (ref 8.9–10.3)
Chloride: 103 mmol/L (ref 98–111)
Creatinine, Ser: 1.18 mg/dL — ABNORMAL HIGH (ref 0.44–1.00)
GFR, Estimated: 50 mL/min — ABNORMAL LOW (ref >60)
Glucose, Bld: 124 mg/dL — ABNORMAL HIGH (ref 70–99)
Potassium: 3.3 mmol/L — ABNORMAL LOW (ref 3.5–5.1)
Sodium: 138 mmol/L (ref 135–145)
Total Bilirubin: 0.5 mg/dL (ref 0.3–1.2)
Total Protein: 6.9 g/dL (ref 6.5–8.1)

## 2023-03-26 LAB — URINALYSIS, ROUTINE W REFLEX MICROSCOPIC
Bacteria, UA: NONE SEEN
Bilirubin Urine: NEGATIVE
Glucose, UA: NEGATIVE mg/dL
Ketones, ur: NEGATIVE mg/dL
Leukocytes,Ua: NEGATIVE
Nitrite: NEGATIVE
Protein, ur: NEGATIVE mg/dL
Specific Gravity, Urine: 1.009 (ref 1.005–1.030)
pH: 6 (ref 5.0–8.0)

## 2023-03-26 LAB — LIPASE, BLOOD: Lipase: 32 U/L (ref 11–51)

## 2023-03-26 MED ORDER — ACETAMINOPHEN 500 MG PO TABS
1000.0000 mg | ORAL_TABLET | Freq: Once | ORAL | Status: AC
  Administered 2023-03-26: 1000 mg via ORAL
  Filled 2023-03-26: qty 2

## 2023-03-26 MED ORDER — METHOCARBAMOL 750 MG PO TABS: 750.0000 mg | ORAL_TABLET | Freq: Three times a day (TID) | ORAL | 0 refills | Status: DC | PRN

## 2023-03-26 MED ORDER — POTASSIUM CHLORIDE CRYS ER 20 MEQ PO TBCR
40.0000 meq | EXTENDED_RELEASE_TABLET | Freq: Once | ORAL | Status: AC
  Administered 2023-03-26: 40 meq via ORAL
  Filled 2023-03-26: qty 2

## 2023-03-26 MED ORDER — OXYCODONE HCL 5 MG PO TABS
5.0000 mg | ORAL_TABLET | Freq: Once | ORAL | Status: AC
  Administered 2023-03-26: 5 mg via ORAL
  Filled 2023-03-26: qty 1

## 2023-03-26 NOTE — ED Provider Notes (Signed)
[place] EMERGENCY DEPARTMENT AT [facility] Provider Note   CSN: [id number] Arrival date & time: 03/26/23  1743     History  Chief Complaint  Patient presents with   Abdominal Pain    [name] is a 67 y.o. female.  Pt with hx diverticula of colon, c/o LLQ abd pain. Indicates her gi doc called in rx cipro and flagyl which she completed but not better. C/o increased LLQ pain in past 1-2 days, constant, dull, non radiating. No dysuria or hematuria. No vaginal discharge or bleeding. No fever or chills. Having bms. No constipation. No vomiting.   The history is provided by the patient and medical records.  Abdominal Pain Associated symptoms: no chest pain, no chills, no constipation, no cough, no diarrhea, no dysuria, no fever, no shortness of breath and no vomiting        Home Medications Prior to Admission medications   Medication Sig Start Date End Date Taking? Authorizing Provider  methocarbamol (ROBAXIN) 750 MG tablet Take 1 tablet (750 mg total) by mouth 3 (three) times daily as needed (muscle spasm/pain). 03/26/23  Yes [name] Laine, MD  acetaminophen (TYLENOL) 500 MG tablet Take 500 mg by mouth every 6 (six) hours as needed (for pain).    [provider]  albuterol (VENTOLIN HFA) 108 (90 Base) MCG/ACT inhaler Inhale 2 puffs into the lungs every 6 (six) hours as needed for wheezing or shortness of breath.  02/08/12   [provider]  clonazePAM (KLONOPIN) 0.5 MG tablet Take 2 tablets (1 mg total) by mouth at bedtime. For sleep REM BD 02/07/23   Dohmeier, Porfirio Mylar, MD  diltiazem (DILACOR XR) 180 MG 24 hr capsule Take 180 mg by mouth daily.    [provider]  EPINEPHrine 0.3 mg/0.3 mL IJ SOAJ injection Inject 0.3 mLs as directed as needed (for an anaphylactic reaction).  02/14/13   [provider]  Eyelid Cleansers (AVENOVA) 0.01 % SOLN Place 1 application into the right eye daily.  08/16/17   [provider]  famotidine  (PEPCID) 40 MG tablet Take 40 mg by mouth daily. 09/25/18   [provider]  fluticasone (FLONASE) 50 MCG/ACT nasal spray Place 1 spray into both nostrils daily as needed for allergies or rhinitis.     [provider]  Fluticasone-Salmeterol (ADVAIR) 500-50 MCG/DOSE AEPB Inhale 1 puff into the lungs daily as needed (for shortness of breath).  02/08/12   [provider]  furosemide (LASIX) 40 MG tablet Take 40 mg by mouth daily.  08/07/13   [provider]  LAGEVRIO 200 MG CAPS capsule 4 capsules Orally every 12 hrs for 5 days 04/03/22   [provider]  meclizine (ANTIVERT) 25 MG tablet Take 1 tablet po q 6 hours prn Orally as directed for 30 day(s) 11/17/20   [provider]  methocarbamol (ROBAXIN) 500 MG tablet 1 tablet Orally Every 8 hours as needed for 30 days    [provider]  metoprolol tartrate (LOPRESSOR) 25 MG tablet Take 1 tablet (25 mg total) by mouth 2 (two) times daily. 08/26/22   Rollene Rotunda, MD  montelukast (SINGULAIR) 10 MG tablet Take 10 mg by mouth at bedtime.  02/21/15   [provider]  Multiple Vitamin (MULTIVITAMIN WITH MINERALS) TABS tablet Take 1 tablet by mouth daily.    [provider]  nystatin (MYCOSTATIN) 100000 UNIT/ML suspension SMARTSIG:4 Milliliter(s) By Mouth 4 Times Daily Patient not taking: Reported on 02/07/2023 02/06/22   [provider]  pantoprazole (PROTONIX) 40 MG tablet Take 40 mg by mouth daily.    [provider]  ramipril (ALTACE) 5 MG capsule Take 1 capsule (5 mg total) by mouth daily. 08/26/22   Rollene Rotunda, MD  RESTASIS 0.05 % ophthalmic emulsion Place 1 drop into both eyes 2 (two) times daily. 08/23/17   [provider]  rosuvastatin (CRESTOR) 20 MG tablet Take 10 mg by mouth daily. 08/07/13   [provider]  saccharomyces boulardii (FLORASTOR) 250 MG capsule Take one capsule daily Oral 04/20/18   [provider]       Allergies    Aspirin, Bee venom, Demerol [meperidine hcl], Demerol [meperidine], Ibuprofen, Nsaids, Iohexol, Ciprofloxacin, Clindamycin/lincomycin, Codeine, Ivp dye [iodinated contrast media], Mupirocin, Neosporin [neomycin-bacitracin zn-polymyx], Pollen extract, Sulfonamide derivatives, Tramadol, Cephalosporins, Nystatin, and Penicillins    Review of Systems   Review of Systems  Constitutional:  Negative for chills and fever.  Eyes:  Negative for redness.  Respiratory:  Negative for cough and shortness of breath.   Cardiovascular:  Negative for chest pain.  Gastrointestinal:  Positive for abdominal pain. Negative for constipation, diarrhea and vomiting.  Genitourinary:  Negative for dysuria and flank pain.  Musculoskeletal:  Negative for back pain.  Skin:  Negative for rash.  Neurological:  Negative for headaches.  Hematological:  Does not bruise/bleed easily.  Psychiatric/Behavioral:  Negative for confusion.     Physical Exam Updated Vital Signs BP 112/61   Pulse 63   Temp 98 F (36.7 C) (Oral)   Resp 18   Ht 1.727 m (5\' 8" )   Wt 92.5 kg   SpO2 95%   BMI 31.02 kg/m  Physical Exam Vitals and nursing note reviewed.  Constitutional:      Appearance: Normal appearance. She is well-developed.  HENT:     Head: Atraumatic.     Nose: Nose normal.     Mouth/Throat:     Mouth: Mucous membranes are moist.  Eyes:     General: No scleral icterus.    Conjunctiva/sclera: Conjunctivae normal.  Neck:     Trachea: No tracheal deviation.  Cardiovascular:     Rate and Rhythm: Normal rate and regular rhythm.     Pulses: Normal pulses.     Heart sounds: Normal heart sounds. No murmur heard.    No friction rub. No gallop.  Pulmonary:     Effort: Pulmonary effort is normal. No respiratory distress.     Breath sounds: Normal breath sounds.  Abdominal:     General: Bowel sounds are normal. There is no distension.     Palpations: Abdomen is soft.     Tenderness: There is abdominal  tenderness. There is no guarding.     Comments: LLQ tenderness.   Genitourinary:    Comments: No cva tenderness.  Musculoskeletal:        General: No swelling.     Cervical back: Normal range of motion and neck supple. No rigidity. No muscular tenderness.  Skin:    General: Skin is warm and dry.     Findings: No rash.  Neurological:     Mental Status: She is alert.     Comments: Alert, speech normal.   Psychiatric:        Mood and Affect: Mood normal.     ED Results / Procedures / Treatments   Labs (all labs ordered are listed, but only abnormal results are displayed) Results for orders placed or performed during the hospital encounter of 03/26/23  Lipase,  blood  Result Value Ref Range   Lipase 32 11 - 51 U/L  Comprehensive metabolic panel  Result Value Ref Range   Sodium 138 135 - 145 mmol/L   Potassium 3.3 (L) 3.5 - 5.1 mmol/L   Chloride 103 98 - 111 mmol/L   CO2 27 22 - 32 mmol/L   Glucose, Bld 124 (H) 70 - 99 mg/dL   BUN 17 8 - 23 mg/dL   Creatinine, Ser 1.61 (H) 0.44 - 1.00 mg/dL   Calcium 9.0 8.9 - 09.6 mg/dL   Total Protein 6.9 6.5 - 8.1 g/dL   Albumin 3.8 3.5 - 5.0 g/dL   AST 16 15 - 41 U/L   ALT 14 0 - 44 U/L   Alkaline Phosphatase 66 38 - 126 U/L   Total Bilirubin 0.5 0.3 - 1.2 mg/dL   GFR, Estimated 50 (L) >60 mL/min   Anion gap 8 5 - 15  CBC  Result Value Ref Range   WBC 8.9 4.0 - 10.5 K/uL   RBC 4.28 3.87 - 5.11 MIL/uL   Hemoglobin 13.4 12.0 - 15.0 g/dL   HCT 04.5 40.9 - 81.1 %   MCV 94.2 80.0 - 100.0 fL   MCH 31.3 26.0 - 34.0 pg   MCHC 33.3 30.0 - 36.0 g/dL   RDW 91.4 78.2 - 95.6 %   Platelets 321 150 - 400 K/uL   nRBC 0.0 0.0 - 0.2 %  Urinalysis, Routine w reflex microscopic -Urine, Clean Catch  Result Value Ref Range   Color, Urine YELLOW YELLOW   APPearance CLEAR CLEAR   Specific Gravity, Urine 1.009 1.005 - 1.030   pH 6.0 5.0 - 8.0   Glucose, UA NEGATIVE NEGATIVE mg/dL   Hgb urine dipstick SMALL (A) NEGATIVE   Bilirubin Urine NEGATIVE  NEGATIVE   Ketones, ur NEGATIVE NEGATIVE mg/dL   Protein, ur NEGATIVE NEGATIVE mg/dL   Nitrite NEGATIVE NEGATIVE   Leukocytes,Ua NEGATIVE NEGATIVE   RBC / HPF 0-5 0 - 5 RBC/hpf   WBC, UA 0-5 0 - 5 WBC/hpf   Bacteria, UA NONE SEEN NONE SEEN   Squamous Epithelial / HPF 0-5 0 - 5 /HPF     EKG None  Radiology CT ABDOMEN PELVIS WO CONTRAST  Result Date: 03/26/2023 CLINICAL DATA:  Left lower quadrant abdominal pain EXAM: CT ABDOMEN AND PELVIS WITHOUT CONTRAST TECHNIQUE: Multidetector CT imaging of the abdomen and pelvis was performed following the standard protocol without IV contrast. RADIATION DOSE REDUCTION: This exam was performed according to the departmental dose-optimization program which includes automated exposure control, adjustment of the mA and/or kV according to patient size and/or use of iterative reconstruction technique. COMPARISON:  CT abdomen and pelvis 01/31/2022 FINDINGS: Lower chest: There some patchy ground-glass opacities in the lung bases. Hepatobiliary: No focal liver abnormality is seen. Status post cholecystectomy. No biliary dilatation. Pancreas: Unremarkable. No pancreatic ductal dilatation or surrounding inflammatory changes. Spleen: Surgically absent, unchanged. Adrenals/Urinary Tract: Adrenal glands are unremarkable. Kidneys are normal, without renal calculi, focal lesion, or hydronephrosis. Bladder is unremarkable. Stomach/Bowel: Stomach is within normal limits. Appendix appears normal. No evidence of bowel wall thickening, distention, or inflammatory changes. There is sigmoid colon diverticulosis. Vascular/Lymphatic: Aorta and IVC are normal in size. No enlarged lymph nodes are identified. There are scattered nonenlarged upper abdominal lymph nodes. There central mesenteric haziness with small lymph nodes as seen on the prior study. Reproductive: Status post hysterectomy. No adnexal masses. Other: There is a small fat containing umbilical hernia. There is no ascites.  Musculoskeletal: No acute or significant osseous findings. IMPRESSION: 1. No acute localizing process in the abdomen or pelvis. 2. Sigmoid colon diverticulosis without evidence for diverticulitis. 3. Stable central mesenteric haziness with small lymph nodes. Findings can be seen in the setting of mesenteric panniculitis. 4. Patchy ground-glass opacities in the lung bases, likely infectious/inflammatory. Electronically Signed   By: Darliss Cheney M.D.   On: 03/26/2023 20:10    Procedures Procedures    Medications Ordered in ED Medications  acetaminophen (TYLENOL) tablet 1,000 mg (1,000 mg Oral Given 03/26/23 2042)  oxyCODONE (Oxy IR/ROXICODONE) immediate release tablet 5 mg (5 mg Oral Given 03/26/23 2240)  potassium chloride SA (KLOR-CON M) CR tablet 40 mEq (40 mEq Oral Given 03/26/23 2240)    ED Course/ Medical Decision Making/ A&P                                 Medical Decision Making Problems Addressed: Hypokalemia: acute illness or injury LLQ abdominal pain: acute illness or injury with systemic symptoms that poses a threat to life or bodily functions  Amount and/or Complexity of Data Reviewed External Data Reviewed: labs, radiology and notes. Labs: ordered. Decision-making details documented in ED Course. Radiology: ordered and independent interpretation performed. Decision-making details documented in ED Course.  Risk OTC drugs. Prescription drug management. Decision regarding hospitalization.   Iv ns. Continuous pulse ox and cardiac monitoring. Labs ordered/sent. Imaging ordered.   Differential diagnosis includes diverticulitis, bowel gas pain, constipation, ureteral stone, etc. Dispo decision including potential need for admission considered - will get labs and imaging and reassess.   Reviewed nursing notes and prior charts for additional history. External reports reviewed.   Cardiac monitor: sinus rhythm, rate 63.  Labs reviewed/interpreted by me - wbc and hgb normal.  Lipase normal. K mildly low, kcl po. Ua neg for uti.   Pt requests pain medication. Multiple allergies noted. Oxycodone po.   CT reviewed/interpreted by me - no acute abd process, no diverticulitis.   Pt without cough or uri symptoms. No fevers.   Vitals normal, no distress. Afebrile. Pt currently appears stable for d/c   Rec close pcp f/u.  Return precautions provided.          Final Clinical Impression(s) / ED Diagnoses Final diagnoses:  LLQ abdominal pain  Hypokalemia    Rx / DC Orders ED Discharge Orders          Ordered    methocarbamol (ROBAXIN) 750 MG tablet  3 times daily PRN        03/26/23 2304              [name] Laine, MD 03/26/23 2306

## 2023-03-26 NOTE — ED Triage Notes (Signed)
LLQ ABD pain started yesterday Finished Cipro and flagyl for diverticulitis on Tuesday. Denies blood in stool Pain 7/10 Denies nausea and vomiting

## 2023-03-26 NOTE — ED Notes (Signed)
Water given and tolerated without difficulty.

## 2023-03-26 NOTE — ED Notes (Signed)
Informed MD of pt continued pain

## 2023-03-26 NOTE — Discharge Instructions (Addendum)
It was our pleasure to provide your ER care today - we hope that you feel better.  Drink plenty of fluids/stay well hydrated.   Take acetaminophen as need for pain. You may also try robaxin as need for symptom relief - no driving for the next 6 hours, or when taking robaxin.   From today's labs, your potassium level is mildly low - eat plenty of fruits and vegetables, and follow up with primary care doctor.   Your CT scan was read as showing no acute process, incidental note was made of: 1. No acute localizing process in the abdomen or pelvis.2. Sigmoid colon diverticulosis without evidence for diverticulitis. 3. Stable central mesenteric haziness with small lymph nodes. Findings can be seen in the setting of mesenteric panniculitis. 4. Patchy ground-glass opacities in the lung bases, possiblyInfectious or inflammatory.   Follow up closely with primary care doctor in the next 2-3 days for recheck if symptoms fail to improve/resolve. Have primary care doctor review above imaging test, and arrange follow up imaging.   Return to ER if worse, new symptoms, fevers, intractable pain, persistent vomiting, cough/trouble breathing, or other concern.

## 2023-03-27 ENCOUNTER — Emergency Department (HOSPITAL_COMMUNITY)
Admission: EM | Admit: 2023-03-27 | Discharge: 2023-03-28 | Disposition: A | Discharge status: Home / Self Care | Payer: Medicare Other | Attending: Emergency Medicine | Admitting: Emergency Medicine

## 2023-03-27 ENCOUNTER — Emergency Department (HOSPITAL_COMMUNITY): Payer: Medicare Other

## 2023-03-27 ENCOUNTER — Encounter (HOSPITAL_COMMUNITY): Payer: Self-pay

## 2023-03-27 DIAGNOSIS — I880 Nonspecific mesenteric lymphadenitis: Secondary | ICD-10-CM | POA: Insufficient documentation

## 2023-03-27 DIAGNOSIS — R1032 Left lower quadrant pain: Secondary | ICD-10-CM | POA: Diagnosis present

## 2023-03-27 LAB — COMPREHENSIVE METABOLIC PANEL
ALT: 15 U/L (ref 0–44)
AST: 19 U/L (ref 15–41)
Albumin: 3.9 g/dL (ref 3.5–5.0)
Alkaline Phosphatase: 61 U/L (ref 38–126)
Anion gap: 10 (ref 5–15)
BUN: 11 mg/dL (ref 8–23)
CO2: 26 mmol/L (ref 22–32)
Calcium: 9.5 mg/dL (ref 8.9–10.3)
Chloride: 104 mmol/L (ref 98–111)
Creatinine, Ser: 1 mg/dL (ref 0.44–1.00)
GFR, Estimated: >60 mL/min (ref >60)
Glucose, Bld: 123 mg/dL — ABNORMAL HIGH (ref 70–99)
Potassium: 3.9 mmol/L (ref 3.5–5.1)
Sodium: 140 mmol/L (ref 135–145)
Total Bilirubin: 0.9 mg/dL (ref 0.3–1.2)
Total Protein: 7.4 g/dL (ref 6.5–8.1)

## 2023-03-27 LAB — CBC WITH DIFFERENTIAL/PLATELET
Abs Immature Granulocytes: 0.03 10*3/uL (ref 0.00–0.07)
Basophils Absolute: 0.1 10*3/uL (ref 0.0–0.1)
Basophils Relative: 1 %
Eosinophils Absolute: 0.1 10*3/uL (ref 0.0–0.5)
Eosinophils Relative: 1 %
HCT: 44.1 % (ref 36.0–46.0)
Hemoglobin: 14 g/dL (ref 12.0–15.0)
Immature Granulocytes: 0 %
Lymphocytes Relative: 37 %
Lymphs Abs: 3.3 10*3/uL (ref 0.7–4.0)
MCH: 30 pg (ref 26.0–34.0)
MCHC: 31.7 g/dL (ref 30.0–36.0)
MCV: 94.6 fL (ref 80.0–100.0)
Monocytes Absolute: 0.9 10*3/uL (ref 0.1–1.0)
Monocytes Relative: 10 %
Neutro Abs: 4.4 10*3/uL (ref 1.7–7.7)
Neutrophils Relative %: 51 %
Platelets: 338 10*3/uL (ref 150–400)
RBC: 4.66 MIL/uL (ref 3.87–5.11)
RDW: 13.9 % (ref 11.5–15.5)
WBC: 8.9 10*3/uL (ref 4.0–10.5)
nRBC: 0 % (ref 0.0–0.2)

## 2023-03-27 LAB — URINALYSIS, ROUTINE W REFLEX MICROSCOPIC
Bilirubin Urine: NEGATIVE
Glucose, UA: NEGATIVE mg/dL
Hgb urine dipstick: NEGATIVE
Ketones, ur: NEGATIVE mg/dL
Leukocytes,Ua: NEGATIVE
Nitrite: NEGATIVE
Protein, ur: NEGATIVE mg/dL
Specific Gravity, Urine: 1.013 (ref 1.005–1.030)
pH: 7 (ref 5.0–8.0)

## 2023-03-27 LAB — I-STAT CG4 LACTIC ACID, ED: Lactic Acid, Venous: 0.9 mmol/L (ref 0.5–1.9)

## 2023-03-27 LAB — LIPASE, BLOOD: Lipase: 28 U/L (ref 11–51)

## 2023-03-27 MED ORDER — DIPHENHYDRAMINE HCL 25 MG PO CAPS
50.0000 mg | ORAL_CAPSULE | Freq: Once | ORAL | Status: AC
  Administered 2023-03-27: 50 mg via ORAL
  Filled 2023-03-27: qty 2

## 2023-03-27 MED ORDER — HYDROMORPHONE HCL 1 MG/ML IJ SOLN
1.0000 mg | Freq: Once | INTRAMUSCULAR | Status: AC
  Administered 2023-03-27: 1 mg via INTRAVENOUS
  Filled 2023-03-27: qty 1

## 2023-03-27 MED ORDER — SENNOSIDES-DOCUSATE SODIUM 8.6-50 MG PO TABS
2.0000 | ORAL_TABLET | Freq: Every day | ORAL | 0 refills | Status: DC
Start: 2023-03-27 — End: 2023-03-29

## 2023-03-27 MED ORDER — METHYLPREDNISOLONE SODIUM SUCC 40 MG IJ SOLR
40.0000 mg | Freq: Once | INTRAMUSCULAR | Status: AC
  Administered 2023-03-27: 40 mg via INTRAVENOUS
  Filled 2023-03-27: qty 1

## 2023-03-27 MED ORDER — DIPHENHYDRAMINE HCL 50 MG/ML IJ SOLN: 50.0000 mg | Freq: Once | INTRAMUSCULAR | Status: AC

## 2023-03-27 MED ORDER — OXYCODONE-ACETAMINOPHEN 5-325 MG PO TABS
1.0000 | ORAL_TABLET | Freq: Four times a day (QID) | ORAL | 0 refills | Status: DC | PRN
Start: 2023-03-27 — End: 2023-11-02

## 2023-03-27 MED ORDER — IOHEXOL 350 MG/ML SOLN
75.0000 mL | Freq: Once | INTRAVENOUS | Status: AC | PRN
  Administered 2023-03-27: 75 mL via INTRAVENOUS

## 2023-03-27 MED ORDER — HYDROMORPHONE HCL 1 MG/ML IJ SOLN
1.0000 mg | Freq: Once | INTRAMUSCULAR | Status: AC
  Administered 2023-03-28: 1 mg via INTRAVENOUS
  Filled 2023-03-27: qty 1

## 2023-03-27 NOTE — Discharge Instructions (Addendum)
Tonight CT and labs have been reassuring.  There is inflammation in your lower abdomen and this may take several days to improve.  Please take all medication as prescribed and follow-up with your primary care physician.  Return here for concerning changes in your condition.

## 2023-03-27 NOTE — ED Triage Notes (Signed)
Left groin pain that started lastnight, it radiates down he rleg, states it has worsened over the last 24hrs. Pt also saw her pcp for this and he is concerned with a femoral aneurysm. Has Hx of a splenic artery aneurysm. No on blood thinners. Pt is experiencing large amounts of pain in the left groin area. No other complaints at this time.   Medic vitals   138/102 76hr 99%ra 98.0 temporal

## 2023-03-27 NOTE — ED Provider Notes (Signed)
[place] EMERGENCY DEPARTMENT AT [facility] Provider Note   CSN: [id number] Arrival date & time: 03/27/23  1613     History  Chief Complaint  Patient presents with   Groin Pain    [name] is a 67 y.o. female.  HPI This for the second time in 2 days to emergency department. She presents with concern for left lower quadrant, left thigh pain.  Onset was a few days ago, and since then, and since yesterday in particular she has had worsening pain in this area.  Pain is worse with leg motion, not improved with anything.  She was seen, evaluated, had CT scan yesterday, had outpatient follow-up today.  CT scan yesterday with possible mesenteric adenitis.  With a history of prior splenic artery aneurysm, she saw her physician, was sent here for consideration of additional abdominal pathology.  She notes the pain is severe, not improved with anything, 12/10.    Home Medications Prior to Admission medications   Medication Sig Start Date End Date Taking? Authorizing Provider  acetaminophen (TYLENOL) 500 MG tablet Take 1,000 mg by mouth daily as needed (for pain).   Yes [provider]  albuterol (VENTOLIN HFA) 108 (90 Base) MCG/ACT inhaler Inhale 2 puffs into the lungs every 6 (six) hours as needed for wheezing or shortness of breath.  02/08/12  Yes [provider]  clonazePAM (KLONOPIN) 0.5 MG tablet Take 2 tablets (1 mg total) by mouth at bedtime. For sleep REM BD Patient taking differently: Take 0.75 mg by mouth at bedtime. Take 1.5 tablets by mouth for sleep REM BD 02/07/23  Yes Dohmeier, Porfirio Mylar, MD  diltiazem (DILACOR XR) 180 MG 24 hr capsule Take 180 mg by mouth daily.   Yes [provider]  EPINEPHrine 0.3 mg/0.3 mL IJ SOAJ injection Inject 0.3 mLs as directed as needed (for an anaphylactic reaction).  02/14/13  Yes [provider]  Eyelid Cleansers (AVENOVA) 0.01 % SOLN Place 1 application into the right eye daily.  08/16/17  Yes  [provider]  famotidine (PEPCID) 40 MG tablet Take 40 mg by mouth daily. 09/25/18  Yes [provider]  fluticasone (FLONASE) 50 MCG/ACT nasal spray Place 1 spray into both nostrils daily as needed for allergies or rhinitis.    Yes [provider]  furosemide (LASIX) 40 MG tablet Take 40 mg by mouth daily.  08/07/13  Yes [provider]  meclizine (ANTIVERT) 25 MG tablet Take 25 mg by mouth daily as needed for dizziness. 11/17/20  Yes [provider]  methocarbamol (ROBAXIN) 750 MG tablet Take 1 tablet (750 mg total) by mouth 3 (three) times daily as needed (muscle spasm/pain). 03/26/23  Yes [name] Laine, MD  metoprolol tartrate (LOPRESSOR) 25 MG tablet Take 1 tablet (25 mg total) by mouth 2 (two) times daily. 08/26/22  Yes Rollene Rotunda, MD  montelukast (SINGULAIR) 10 MG tablet Take 10 mg by mouth at bedtime.  02/21/15  Yes [provider]  Multiple Vitamin (MULTIVITAMIN WITH MINERALS) TABS tablet Take 1 tablet by mouth daily.   Yes [provider]  oxyCODONE-acetaminophen (PERCOCET/ROXICET) 5-325 MG tablet Take 1 tablet by mouth every 6 (six) hours as needed for severe pain (pain score 7-10). 03/27/23  Yes Gerhard Munch, MD  pantoprazole (PROTONIX) 40 MG tablet Take 40 mg by mouth daily.   Yes [provider]  ramipril (ALTACE) 5 MG capsule Take 1 capsule (5 mg total) by mouth daily. 08/26/22  Yes Rollene Rotunda, MD  rosuvastatin (CRESTOR) 20 MG tablet Take 10 mg by mouth daily. 08/07/13  Yes [provider]  saccharomyces boulardii (FLORASTOR) 250 MG capsule Take 250 mg by mouth daily. 04/20/18  Yes [provider]  senna-docusate (SENOKOT-S) 8.6-50 MG tablet Take 2 tablets by mouth daily for 10 days. 03/27/23 04/06/23 Yes Gerhard Munch, MD  Monte Fantasia INHUB 250-50 MCG/ACT AEPB Inhale 1 puff into the lungs daily as needed (for shortness of breath). 03/08/23  Yes [provider]  XIIDRA 5 % SOLN Apply  1 drop to eye 2 (two) times daily. 01/30/23  Yes [provider]      Allergies    Aspirin, Bee venom, Demerol [meperidine], Ibuprofen, Nsaids, Iohexol, Amitriptyline, Ciprofloxacin, Clindamycin/lincomycin, Codeine, Ivp dye [iodinated contrast media], Mupirocin, Neosporin [neomycin-bacitracin zn-polymyx], Pollen extract, Sulfonamide derivatives, Tramadol, Cephalosporins, Nystatin, and Penicillins    Review of Systems   Review of Systems  Physical Exam Updated Vital Signs BP 134/77   Pulse 73   Temp 98.2 F (36.8 C)   Resp 16   Ht 5\' 8"  (1.727 m)   Wt 92.5 kg   SpO2 94%   BMI 31.01 kg/m  Physical Exam Vitals and nursing note reviewed.  Constitutional:      General: She is not in acute distress.    Appearance: She is well-developed.  HENT:     Head: Normocephalic and atraumatic.  Eyes:     Conjunctiva/sclera: Conjunctivae normal.  Cardiovascular:     Rate and Rhythm: Normal rate and regular rhythm.     Comments: Palpable pedal pulses left and right. Color in both lower extremities is same, unremarkable. Pulmonary:     Effort: Pulmonary effort is normal. No respiratory distress.     Breath sounds: Normal breath sounds. No stridor.  Abdominal:     General: There is no distension.  Musculoskeletal:     Comments: No deformity of the lower extremities, no rash.  She can flex the hip though she has substantial pain doing so in the inguinal crease.  Abdomen is soft, nonperitoneal.  Knee unremarkable, ankle unremarkable.  Skin:    General: Skin is warm and dry.  Neurological:     Mental Status: She is alert and oriented to person, place, and time.     Cranial Nerves: No cranial nerve deficit.  Psychiatric:        Mood and Affect: Mood normal.     ED Results / Procedures / Treatments   Labs (all labs ordered are listed, but only abnormal results are displayed) Labs Reviewed  COMPREHENSIVE METABOLIC PANEL - Abnormal; Notable for the following components:       Result Value   Glucose, Bld 123 (*)    All other components within normal limits  CBC WITH DIFFERENTIAL/PLATELET  URINALYSIS, ROUTINE W REFLEX MICROSCOPIC  LIPASE, BLOOD  I-STAT CG4 LACTIC ACID, ED  I-STAT CG4 LACTIC ACID, ED    EKG None  Radiology CT ABDOMEN PELVIS W CONTRAST  Result Date: 03/27/2023 CLINICAL DATA:  Left groin pain and lymphadenopathy. Evaluate for AVM or infection. EXAM: CT ABDOMEN AND PELVIS WITH CONTRAST TECHNIQUE: Multidetector CT imaging of the abdomen and pelvis was performed using the standard protocol following bolus administration of intravenous contrast. RADIATION DOSE REDUCTION: This exam was performed according to the departmental dose-optimization program which includes automated exposure control, adjustment of the mA and/or kV according to patient size and/or use of iterative reconstruction technique. CONTRAST:  75mL OMNIPAQUE IOHEXOL 350 MG/ML SOLN COMPARISON:  CT abdomen and pelvis 03/26/2023. FINDINGS:  Lower chest: There is a new trace right pleural effusion Hepatobiliary: No focal liver abnormality is seen. Status post cholecystectomy. No biliary dilatation. Pancreas: Unremarkable. No pancreatic ductal dilatation or surrounding inflammatory changes. Spleen: Status post splenectomy. Adrenals/Urinary Tract: Adrenal glands are unremarkable. Kidneys are normal, without renal calculi, focal lesion, or hydronephrosis. Bladder is unremarkable. Stomach/Bowel: Stomach is within normal limits. Appendix appears normal. No evidence of bowel wall thickening, distention, or inflammatory changes. There is diffuse colonic diverticulosis. Vascular/Lymphatic: No significant vascular findings are present. No enlarged abdominal or pelvic lymph nodes. Reproductive: Status post hysterectomy. No adnexal masses. Other: There is no ascites or free air. Central mesenteric haziness and small lymph nodes appear unchanged. There is a small fat containing umbilical hernia. Musculoskeletal: No  fracture is seen. IMPRESSION: 1. No acute localizing process in the abdomen or pelvis. 2. New trace right pleural effusion. 3. Colonic diverticulosis. 4. Stable central mesenteric haziness and small lymph nodes, likely mesenteric panniculitis. Electronically Signed   By: Darliss Cheney M.D.   On: 03/27/2023 23:42   CT ABDOMEN PELVIS WO CONTRAST  Result Date: 03/26/2023 CLINICAL DATA:  Left lower quadrant abdominal pain EXAM: CT ABDOMEN AND PELVIS WITHOUT CONTRAST TECHNIQUE: Multidetector CT imaging of the abdomen and pelvis was performed following the standard protocol without IV contrast. RADIATION DOSE REDUCTION: This exam was performed according to the departmental dose-optimization program which includes automated exposure control, adjustment of the mA and/or kV according to patient size and/or use of iterative reconstruction technique. COMPARISON:  CT abdomen and pelvis 01/31/2022 FINDINGS: Lower chest: There some patchy ground-glass opacities in the lung bases. Hepatobiliary: No focal liver abnormality is seen. Status post cholecystectomy. No biliary dilatation. Pancreas: Unremarkable. No pancreatic ductal dilatation or surrounding inflammatory changes. Spleen: Surgically absent, unchanged. Adrenals/Urinary Tract: Adrenal glands are unremarkable. Kidneys are normal, without renal calculi, focal lesion, or hydronephrosis. Bladder is unremarkable. Stomach/Bowel: Stomach is within normal limits. Appendix appears normal. No evidence of bowel wall thickening, distention, or inflammatory changes. There is sigmoid colon diverticulosis. Vascular/Lymphatic: Aorta and IVC are normal in size. No enlarged lymph nodes are identified. There are scattered nonenlarged upper abdominal lymph nodes. There central mesenteric haziness with small lymph nodes as seen on the prior study. Reproductive: Status post hysterectomy. No adnexal masses. Other: There is a small fat containing umbilical hernia. There is no ascites.  Musculoskeletal: No acute or significant osseous findings. IMPRESSION: 1. No acute localizing process in the abdomen or pelvis. 2. Sigmoid colon diverticulosis without evidence for diverticulitis. 3. Stable central mesenteric haziness with small lymph nodes. Findings can be seen in the setting of mesenteric panniculitis. 4. Patchy ground-glass opacities in the lung bases, likely infectious/inflammatory. Electronically Signed   By: Darliss Cheney M.D.   On: 03/26/2023 20:10    Procedures Procedures    Medications Ordered in ED Medications  HYDROmorphone (DILAUDID) injection 1 mg (has no administration in time range)  methylPREDNISolone sodium succinate (SOLU-MEDROL) 40 mg/mL injection 40 mg (40 mg Intravenous Given 03/27/23 1950)  diphenhydrAMINE (BENADRYL) capsule 50 mg (50 mg Oral Given 03/27/23 2211)    Or  diphenhydrAMINE (BENADRYL) injection 50 mg ( Intravenous See Alternative 03/27/23 2211)  HYDROmorphone (DILAUDID) injection 1 mg (1 mg Intravenous Given 03/27/23 1948)  iohexol (OMNIPAQUE) 350 MG/ML injection 75 mL (75 mLs Intravenous Contrast Given 03/27/23 2126)    ED Course/ Medical Decision Making/ A&P  Medical Decision Making Patient presents with worsening left lower quadrant left inguinal and proximal thigh pain.  Patient is awake and alert, with 12/10 pain, mild hypertension.  She does have a appreciable pulses distally, normal color, with consideration of progression of her disease from yesterday, as well as other pathology, patient had CT scan, labs, analgesics ordered.  Less likely shingles absent rash, though there is a possibility. Cardiac 75 sinus normal Pulse ox 100% room air normal   Amount and/or Complexity of Data Reviewed External Data Reviewed: notes.    Details: Yesterday's ED notes and today's outpatient clinic notes reviewed Labs: ordered. Radiology: ordered.  Risk Prescription drug management.   11:58 PM Patient awake,  alert, hemodynamically unremarkable, we discussed all findings, CT consistent with mesenteric adenitis similar to prior, no evidence for other AVM.  I reviewed the lumbar spine reconstruction, no obvious abnormality.  With reassuring labs, CT consistent with prior, notes bacteremia, sepsis, patient will receive analgesics here, continue meds previously provided, follow-up with primary care.        Final Clinical Impression(s) / ED Diagnoses Final diagnoses:  Mesenteric adenitis    Rx / DC Orders ED Discharge Orders          Ordered    oxyCODONE-acetaminophen (PERCOCET/ROXICET) 5-325 MG tablet  Every 6 hours PRN        03/27/23 2358    senna-docusate (SENOKOT-S) 8.6-50 MG tablet  Daily        03/27/23 2358              Gerhard Munch, MD 03/27/23 2358

## 2023-03-29 ENCOUNTER — Observation Stay (HOSPITAL_COMMUNITY)
Admission: EM | Admit: 2023-03-29 | Discharge: 2023-03-30 | Disposition: A | Discharge status: Home / Self Care | Payer: Medicare Other | Attending: Student | Admitting: Student

## 2023-03-29 ENCOUNTER — Observation Stay (HOSPITAL_COMMUNITY): Payer: Medicare Other

## 2023-03-29 ENCOUNTER — Other Ambulatory Visit: Payer: Self-pay

## 2023-03-29 ENCOUNTER — Encounter (HOSPITAL_COMMUNITY): Payer: Self-pay

## 2023-03-29 DIAGNOSIS — E119 Type 2 diabetes mellitus without complications: Secondary | ICD-10-CM | POA: Diagnosis not present

## 2023-03-29 DIAGNOSIS — I10 Essential (primary) hypertension: Secondary | ICD-10-CM | POA: Diagnosis not present

## 2023-03-29 DIAGNOSIS — K654 Sclerosing mesenteritis: Principal | ICD-10-CM | POA: Diagnosis present

## 2023-03-29 DIAGNOSIS — I110 Hypertensive heart disease with heart failure: Secondary | ICD-10-CM | POA: Diagnosis not present

## 2023-03-29 DIAGNOSIS — K219 Gastro-esophageal reflux disease without esophagitis: Secondary | ICD-10-CM | POA: Diagnosis present

## 2023-03-29 DIAGNOSIS — Z79899 Other long term (current) drug therapy: Secondary | ICD-10-CM | POA: Diagnosis not present

## 2023-03-29 DIAGNOSIS — E785 Hyperlipidemia, unspecified: Secondary | ICD-10-CM | POA: Diagnosis present

## 2023-03-29 DIAGNOSIS — E669 Obesity, unspecified: Secondary | ICD-10-CM

## 2023-03-29 DIAGNOSIS — I880 Nonspecific mesenteric lymphadenitis: Principal | ICD-10-CM

## 2023-03-29 DIAGNOSIS — Z23 Encounter for immunization: Secondary | ICD-10-CM | POA: Diagnosis not present

## 2023-03-29 DIAGNOSIS — Z6831 Body mass index (BMI) 31.0-31.9, adult: Secondary | ICD-10-CM | POA: Insufficient documentation

## 2023-03-29 DIAGNOSIS — I480 Paroxysmal atrial fibrillation: Secondary | ICD-10-CM | POA: Insufficient documentation

## 2023-03-29 DIAGNOSIS — R1032 Left lower quadrant pain: Secondary | ICD-10-CM | POA: Diagnosis present

## 2023-03-29 DIAGNOSIS — E1165 Type 2 diabetes mellitus with hyperglycemia: Secondary | ICD-10-CM | POA: Insufficient documentation

## 2023-03-29 DIAGNOSIS — I5032 Chronic diastolic (congestive) heart failure: Secondary | ICD-10-CM | POA: Diagnosis present

## 2023-03-29 DIAGNOSIS — R52 Pain, unspecified: Secondary | ICD-10-CM

## 2023-03-29 DIAGNOSIS — E7849 Other hyperlipidemia: Secondary | ICD-10-CM

## 2023-03-29 LAB — COMPREHENSIVE METABOLIC PANEL
ALT: 17 U/L (ref 0–44)
AST: 21 U/L (ref 15–41)
Albumin: 4.1 g/dL (ref 3.5–5.0)
Alkaline Phosphatase: 65 U/L (ref 38–126)
Anion gap: 11 (ref 5–15)
BUN: 16 mg/dL (ref 8–23)
CO2: 28 mmol/L (ref 22–32)
Calcium: 9.7 mg/dL (ref 8.9–10.3)
Chloride: 102 mmol/L (ref 98–111)
Creatinine, Ser: 1.12 mg/dL — ABNORMAL HIGH (ref 0.44–1.00)
GFR, Estimated: 53 mL/min — ABNORMAL LOW (ref >60)
Glucose, Bld: 134 mg/dL — ABNORMAL HIGH (ref 70–99)
Potassium: 3.5 mmol/L (ref 3.5–5.1)
Sodium: 141 mmol/L (ref 135–145)
Total Bilirubin: 0.5 mg/dL (ref 0.3–1.2)
Total Protein: 7.9 g/dL (ref 6.5–8.1)

## 2023-03-29 LAB — URINALYSIS, ROUTINE W REFLEX MICROSCOPIC
Bacteria, UA: NONE SEEN
Bilirubin Urine: NEGATIVE
Glucose, UA: NEGATIVE mg/dL
Ketones, ur: NEGATIVE mg/dL
Leukocytes,Ua: NEGATIVE
Nitrite: NEGATIVE
Protein, ur: NEGATIVE mg/dL
Specific Gravity, Urine: 1.004 — ABNORMAL LOW (ref 1.005–1.030)
pH: 6 (ref 5.0–8.0)

## 2023-03-29 LAB — CBC
HCT: 44.6 % (ref 36.0–46.0)
Hemoglobin: 14.3 g/dL (ref 12.0–15.0)
MCH: 30.1 pg (ref 26.0–34.0)
MCHC: 32.1 g/dL (ref 30.0–36.0)
MCV: 93.9 fL (ref 80.0–100.0)
Platelets: 341 10*3/uL (ref 150–400)
RBC: 4.75 MIL/uL (ref 3.87–5.11)
RDW: 14.1 % (ref 11.5–15.5)
WBC: 8 10*3/uL (ref 4.0–10.5)
nRBC: 0 % (ref 0.0–0.2)

## 2023-03-29 LAB — SEDIMENTATION RATE: Sed Rate: 19 mm/h (ref 0–22)

## 2023-03-29 LAB — LIPASE, BLOOD: Lipase: 27 U/L (ref 11–51)

## 2023-03-29 LAB — C-REACTIVE PROTEIN: CRP: <0.5 mg/dL (ref <1.0)

## 2023-03-29 LAB — HIV ANTIBODY (ROUTINE TESTING W REFLEX): HIV Screen 4th Generation wRfx: NONREACTIVE

## 2023-03-29 MED ORDER — HEPARIN SODIUM (PORCINE) 5000 UNIT/ML IJ SOLN
5000.0000 [IU] | Freq: Three times a day (TID) | INTRAMUSCULAR | Status: DC
  Administered 2023-03-29 – 2023-03-30 (×3): 5000 [IU] via SUBCUTANEOUS
  Filled 2023-03-29 (×3): qty 1

## 2023-03-29 MED ORDER — HYDROMORPHONE HCL 1 MG/ML IJ SOLN
1.0000 mg | Freq: Once | INTRAMUSCULAR | Status: AC
  Administered 2023-03-29: 1 mg via INTRAVENOUS
  Filled 2023-03-29: qty 1

## 2023-03-29 MED ORDER — DOCUSATE SODIUM 100 MG PO CAPS
100.0000 mg | ORAL_CAPSULE | Freq: Two times a day (BID) | ORAL | Status: DC
  Administered 2023-03-29 – 2023-03-30 (×3): 100 mg via ORAL
  Filled 2023-03-29 (×3): qty 1

## 2023-03-29 MED ORDER — HYDROMORPHONE HCL 1 MG/ML IJ SOLN: 1.0000 mg | INTRAMUSCULAR | Status: DC | PRN

## 2023-03-29 MED ORDER — HYDROMORPHONE HCL 2 MG PO TABS
2.0000 mg | ORAL_TABLET | ORAL | Status: DC | PRN
  Administered 2023-03-29 – 2023-03-30 (×4): 2 mg via ORAL
  Filled 2023-03-29 (×4): qty 1

## 2023-03-29 MED ORDER — ACETAMINOPHEN 650 MG RE SUPP: 650.0000 mg | Freq: Four times a day (QID) | RECTAL | Status: DC | PRN

## 2023-03-29 MED ORDER — METHYLPREDNISOLONE SODIUM SUCC 40 MG IJ SOLR
40.0000 mg | Freq: Once | INTRAMUSCULAR | Status: AC
  Administered 2023-03-29: 40 mg via INTRAVENOUS
  Filled 2023-03-29: qty 1

## 2023-03-29 MED ORDER — PREDNISONE 5 MG PO TABS: 50.0000 mg | ORAL_TABLET | Freq: Four times a day (QID) | ORAL | Status: DC

## 2023-03-29 MED ORDER — ONDANSETRON HCL 4 MG/2ML IJ SOLN
4.0000 mg | Freq: Once | INTRAMUSCULAR | Status: AC
  Administered 2023-03-29: 4 mg via INTRAVENOUS
  Filled 2023-03-29: qty 2

## 2023-03-29 MED ORDER — CLONAZEPAM 1 MG PO TABS: 2.0000 mg | ORAL_TABLET | Freq: Every evening | ORAL | Status: DC | PRN

## 2023-03-29 MED ORDER — DEXAMETHASONE SODIUM PHOSPHATE 10 MG/ML IJ SOLN
10.0000 mg | Freq: Once | INTRAMUSCULAR | Status: AC
  Administered 2023-03-29: 10 mg via INTRAVENOUS
  Filled 2023-03-29 (×3): qty 1

## 2023-03-29 MED ORDER — PANTOPRAZOLE SODIUM 40 MG PO TBEC
40.0000 mg | DELAYED_RELEASE_TABLET | Freq: Every day | ORAL | Status: DC
  Administered 2023-03-30: 40 mg via ORAL
  Filled 2023-03-29: qty 1

## 2023-03-29 MED ORDER — IOHEXOL 350 MG/ML SOLN
75.0000 mL | Freq: Once | INTRAVENOUS | Status: AC | PRN
  Administered 2023-03-29: 75 mL via INTRAVENOUS

## 2023-03-29 MED ORDER — MONTELUKAST SODIUM 10 MG PO TABS
10.0000 mg | ORAL_TABLET | Freq: Every day | ORAL | Status: DC
  Administered 2023-03-29: 10 mg via ORAL
  Filled 2023-03-29: qty 1

## 2023-03-29 MED ORDER — ONDANSETRON HCL 4 MG PO TABS: 4.0000 mg | ORAL_TABLET | Freq: Four times a day (QID) | ORAL | Status: DC | PRN

## 2023-03-29 MED ORDER — ONDANSETRON HCL 4 MG/2ML IJ SOLN: 4.0000 mg | Freq: Four times a day (QID) | INTRAMUSCULAR | Status: DC | PRN

## 2023-03-29 MED ORDER — INFLUENZA VAC A&B SURF ANT ADJ 0.5 ML IM SUSY
0.5000 mL | PREFILLED_SYRINGE | INTRAMUSCULAR | Status: AC
  Administered 2023-03-30: 0.5 mL via INTRAMUSCULAR
  Filled 2023-03-29 (×2): qty 0.5

## 2023-03-29 MED ORDER — METOPROLOL TARTRATE 25 MG PO TABS
25.0000 mg | ORAL_TABLET | Freq: Two times a day (BID) | ORAL | Status: DC
  Administered 2023-03-29: 25 mg via ORAL
  Filled 2023-03-29: qty 1

## 2023-03-29 MED ORDER — HYDRALAZINE HCL 20 MG/ML IJ SOLN: 10.0000 mg | INTRAMUSCULAR | Status: DC | PRN

## 2023-03-29 MED ORDER — DIPHENHYDRAMINE HCL 25 MG PO CAPS: 50.0000 mg | ORAL_CAPSULE | Freq: Once | ORAL | Status: AC

## 2023-03-29 MED ORDER — ACETAMINOPHEN 325 MG PO TABS
650.0000 mg | ORAL_TABLET | Freq: Four times a day (QID) | ORAL | Status: DC | PRN
  Administered 2023-03-29: 650 mg via ORAL
  Filled 2023-03-29: qty 2

## 2023-03-29 MED ORDER — DILTIAZEM HCL ER 180 MG PO CP24
180.0000 mg | ORAL_CAPSULE | Freq: Every day | ORAL | Status: DC
  Administered 2023-03-30: 180 mg via ORAL
  Filled 2023-03-29: qty 1

## 2023-03-29 MED ORDER — DIPHENHYDRAMINE HCL 50 MG/ML IJ SOLN
50.0000 mg | Freq: Once | INTRAMUSCULAR | Status: AC
  Administered 2023-03-29: 50 mg via INTRAVENOUS
  Filled 2023-03-29: qty 1

## 2023-03-29 MED ORDER — ROSUVASTATIN CALCIUM 5 MG PO TABS
10.0000 mg | ORAL_TABLET | Freq: Every day | ORAL | Status: DC
  Administered 2023-03-30: 10 mg via ORAL
  Filled 2023-03-29: qty 2

## 2023-03-29 MED ORDER — DIPHENHYDRAMINE HCL 25 MG PO CAPS: 50.0000 mg | ORAL_CAPSULE | Freq: Once | ORAL | Status: DC

## 2023-03-29 NOTE — Plan of Care (Signed)
  Problem: Health Behavior/Discharge Planning: Goal: Ability to manage health-related needs will improve Outcome: Progressing   Problem: Activity: Goal: Risk for activity intolerance will decrease Outcome: Progressing   Problem: Nutrition: Goal: Adequate nutrition will be maintained Outcome: Not Progressing   Problem: Pain Managment: Goal: General experience of comfort will improve Outcome: Not Progressing

## 2023-03-29 NOTE — H&P (Signed)
History and Physical    [name] ZOX:[id number] DOB: 04-16-1956 DOA: 03/29/2023  DOS: the patient was seen and examined on 03/29/2023  PCP: Chilton Greathouse, MD   Patient coming from: Home  I have personally briefly reviewed patient's old medical records in [facility]  CC: abd pain HPI: 67 year old female history of obesity, hypertension, diet-controlled diabetes presents to the ER today with about a 1 month history of abdominal pain.  She was evaluated about a month ago by her PCP/GI doctor.  Was given a 10-day course of Cipro and Flagyl.  She says that she had some initial improvement but her abdominal pain quickly returned after stopping antibiotics.  Over the last 2 to 3 days, she has been in the ER twice.  She had 2 CT scans which only showed some mesenteric panniculitis but no evidence of diverticulitis.  She has been nauseated but not having any diarrhea.  She has been given IV Dilaudid in the ER with some improvement of her abdominal pain.  She was sent home with p.o. Percocet 5 mg but this has not seemed to help her pain.  She returns to the ER today for the third time in the last 4 days due to continued abdominal pain.  She states that her initial abdominal pain was in her left lower quadrant and down her left leg.  This is since spread now to it being suprapubic, epigastric.  She has some urinary frequency but denies any dysuria.  Denies any fever or chills.  She states she had COVID about 6 weeks ago but did not have any GI symptoms from it.  She does have a history of IV contrast allergy which causes hypertension and causes her to pass out.  When she had her CT contrasted abdominal scan on 03/27/2023, she was pretreated with 40 mg of IV Solu-Medrol.  She is not sure if the IV Solu-Medrol or the IV Dilaudid helped more with her abdominal pain.  She was not discharged on any steroids.  She states that since her ER visit on the 14th, her abdominal pain has gotten  worse.  On arrival temp 97.7 heart rate 70 blood pressure 140/74 satting 100% on room air.  Patient denies any history of any rheumatologic diseases.  She has no prior history of rheumatoid arthritis.  She has no history of lupus.  She has had a splenic artery aneurysm embolization but this was in her early 78s and she was told that this was due to pregnancy.  She has not had any history of abdominal aortic aneurysms or other vasculitis.  Labs today show white count 8.0, hemoglobin 14.3, platelets of 341  Sodium 141, potassium 3.5, chloride 102, bicarb 28, BUN 16, creatinine 1.12, glucose 134  Calcium 9.7, total protein 7.9, albumin 4.1, AST 21, ALT of 17 alk phos of 65  Lipase normal at 27  Urinalysis showed a specific gravity 1.004, pH of 6, negative nitrites, negative leukocyte esterase  Patient given 1 dose of IV Dilaudid.  [facility] consulted for admission.    ED Course: pt given 1 dose of IV dilaudid in ER  Review of Systems:  Review of Systems  Constitutional:  Negative for chills and fever.  HENT: Negative.    Eyes: Negative.   Respiratory: Negative.    Cardiovascular: Negative.   Gastrointestinal:  Positive for abdominal pain and nausea. Negative for constipation and diarrhea.       Normal BM yesterday  Genitourinary:  Positive for frequency.  Negative for dysuria, flank pain and hematuria.  Musculoskeletal:        Pain down her left leg along with her abd pain  Skin: Negative.  Negative for rash.  Neurological: Negative.   Endo/Heme/Allergies: Negative.   Psychiatric/Behavioral: Negative.    All other systems reviewed and are negative.  Past Medical History:  Diagnosis Date   Atrial fibrillation (HCC)    Complication of anesthesia    " I have to be awake to be intubated because I have a mass in my throut "   Diabetes mellitus    Difficult intubation    mass in throut   Diverticulitis    Hyperlipidemia    Hypertension    Migraines 2013   Sigmoid  diverticulitis 03/20/2016   Sleep apnea    CPAP   Past Surgical History:  Procedure Laterality Date   ABDOMINAL HYSTERECTOMY     BIOPSY  05/17/2022   Procedure: BIOPSY;  Surgeon: Kerin Salen, MD;  Location: [facility];  Service: Gastroenterology;;   BREAST CYST ASPIRATION Left 09/2016   COLONOSCOPY WITH PROPOFOL N/A 05/17/2022   Procedure: COLONOSCOPY WITH PROPOFOL;  Surgeon: Kerin Salen, MD;  Location: [facility];  Service: Gastroenterology;  Laterality: N/A;   GALLBLADDER SURGERY     KNEE ARTHROSCOPY     lymph nodectomy     NASAL SINUS SURGERY     POLYPECTOMY  05/17/2022   Procedure: POLYPECTOMY;  Surgeon: Kerin Salen, MD;  Location: [facility];  Service: Gastroenterology;;   SPLENIC ARTERY EMBOLIZATION     due to Aneurysm   TONSILLECTOMY      reports that she has never smoked. She has never used smokeless tobacco. She reports that she does not drink alcohol and does not use drugs.  Allergies  Allergen Reactions   Aspirin Anaphylaxis   Bee Venom Anaphylaxis   Demerol [Meperidine] Anaphylaxis   Ibuprofen Anaphylaxis   Nsaids Anaphylaxis   Iohexol Other (See Comments) and Hypertension    ELEVATION OF B.P, PASSED OUT, PT. NEEDS PRE-MEDS FOR IODINE CONTRAST    Amitriptyline     Mental status changes   Clindamycin/Lincomycin     Thrush    Codeine Nausea And Vomiting   Ivp Dye [Iodinated Contrast Media] Hypertension    Flushing, also   Mupirocin Other (See Comments)    Makes more inflamed   Neosporin [Neomycin-Bacitracin Zn-Polymyx] Other (See Comments)    Makes more inflammed   Pollen Extract Other (See Comments)    Stuffy nose   Sulfonamide Derivatives Hives   Tramadol Nausea And Vomiting   Cephalosporins Swelling   Nystatin Hives    Facial swelling   Penicillins Swelling    Has patient had a PCN reaction causing immediate rash, facial/tongue/throat swelling, SOB or lightheadedness with hypotension: Yes Has patient had a PCN reaction causing severe rash  involving mucus membranes or skin necrosis: No Has patient had a PCN reaction that required hospitalization: No Has patient had a PCN reaction occurring within the last 10 years: No If all of the above answers are "NO", then may proceed with Cephalosporin use.    Family History  Problem Relation Age of Onset   Diabetes Father    Hypertension Father    CAD Daughter 42       CABG X 3   Prior to Admission medications   Medication Sig Start Date End Date Taking? Authorizing Provider  acetaminophen (TYLENOL) 500 MG tablet Take 1,000 mg by mouth daily as needed (for pain).    [provider]  albuterol (VENTOLIN HFA) 108 (90 Base) MCG/ACT inhaler Inhale 2 puffs into the lungs every 6 (six) hours as needed for wheezing or shortness of breath.  02/08/12   [provider]  clonazePAM (KLONOPIN) 0.5 MG tablet Take 2 tablets (1 mg total) by mouth at bedtime. For sleep REM BD Patient taking differently: Take 0.75 mg by mouth at bedtime. Take 1.5 tablets by mouth for sleep REM BD 02/07/23   Dohmeier, Porfirio Mylar, MD  diltiazem (DILACOR XR) 180 MG 24 hr capsule Take 180 mg by mouth daily.    [provider]  EPINEPHrine 0.3 mg/0.3 mL IJ SOAJ injection Inject 0.3 mLs as directed as needed (for an anaphylactic reaction).  02/14/13   [provider]  Eyelid Cleansers (AVENOVA) 0.01 % SOLN Place 1 application into the right eye daily.  08/16/17   [provider]  famotidine (PEPCID) 40 MG tablet Take 40 mg by mouth daily. 09/25/18   [provider]  fluticasone (FLONASE) 50 MCG/ACT nasal spray Place 1 spray into both nostrils daily as needed for allergies or rhinitis.     [provider]  furosemide (LASIX) 40 MG tablet Take 40 mg by mouth daily.  08/07/13   [provider]  meclizine (ANTIVERT) 25 MG tablet Take 25 mg by mouth daily as needed for dizziness. 11/17/20   [provider]  methocarbamol (ROBAXIN) 750 MG tablet Take 1 tablet (750  mg total) by mouth 3 (three) times daily as needed (muscle spasm/pain). 03/26/23   [name] Laine, MD  metoprolol tartrate (LOPRESSOR) 25 MG tablet Take 1 tablet (25 mg total) by mouth 2 (two) times daily. 08/26/22   Rollene Rotunda, MD  montelukast (SINGULAIR) 10 MG tablet Take 10 mg by mouth at bedtime.  02/21/15   [provider]  Multiple Vitamin (MULTIVITAMIN WITH MINERALS) TABS tablet Take 1 tablet by mouth daily.    [provider]  oxyCODONE-acetaminophen (PERCOCET/ROXICET) 5-325 MG tablet Take 1 tablet by mouth every 6 (six) hours as needed for severe pain (pain score 7-10). 03/27/23   Gerhard Munch, MD  pantoprazole (PROTONIX) 40 MG tablet Take 40 mg by mouth daily.    [provider]  ramipril (ALTACE) 5 MG capsule Take 1 capsule (5 mg total) by mouth daily. 08/26/22   Rollene Rotunda, MD  rosuvastatin (CRESTOR) 20 MG tablet Take 10 mg by mouth daily. 08/07/13   [provider]  saccharomyces boulardii (FLORASTOR) 250 MG capsule Take 250 mg by mouth daily. 04/20/18   [provider]  senna-docusate (SENOKOT-S) 8.6-50 MG tablet Take 2 tablets by mouth daily for 10 days. 03/27/23 04/06/23  Gerhard Munch, MD  Monte Fantasia INHUB 250-50 MCG/ACT AEPB Inhale 1 puff into the lungs daily as needed (for shortness of breath). 03/08/23   [provider]  XIIDRA 5 % SOLN Apply 1 drop to eye 2 (two) times daily. 01/30/23   [provider]   Physical Exam: Vitals:   03/29/23 1148 03/29/23 1149  BP: (!) 148/74   Pulse: 70   Resp: 18   Temp: 97.7 F (36.5 C)   SpO2: 100%   Weight:  92.5 kg  Height:  5\' 8"  (1.727 m)   Physical Exam Vitals and nursing note reviewed.  Constitutional:      General: She is not in acute distress.    Appearance: She is obese. She is not toxic-appearing or diaphoretic.  HENT:     Head: Normocephalic and atraumatic.     Nose: Nose  normal.  Eyes:     General: No scleral icterus. Cardiovascular:     Rate and  Rhythm: Normal rate and regular rhythm.     Pulses:          Femoral pulses are 2+ on the right side and 2+ on the left side.      Dorsalis pedis pulses are 2+ on the right side and 2+ on the left side.       Posterior tibial pulses are 2+ on the right side and 2+ on the left side.  Pulmonary:     Effort: Pulmonary effort is normal.     Breath sounds: Normal breath sounds.  Abdominal:     General: Bowel sounds are normal.     Palpations: Abdomen is soft.     Tenderness: There is abdominal tenderness in the epigastric area and left lower quadrant.     Comments: Some referred pain LLQ with palpation of left femoral artery  Musculoskeletal:     Right lower leg: No edema.     Left lower leg: No edema.  Skin:    General: Skin is warm and dry.     Capillary Refill: Capillary refill takes less than 2 seconds.  Neurological:     General: No focal deficit present.     Mental Status: She is alert and oriented to person, place, and time.    Labs on Admission: I have personally reviewed following labs and imaging studies  CBC: Recent Labs  Lab 03/26/23 1809 03/27/23 1950 03/29/23 1155  WBC 8.9 8.9 8.0  NEUTROABS  --  4.4  --   HGB 13.4 14.0 14.3  HCT 40.3 44.1 44.6  MCV 94.2 94.6 93.9  PLT 321 338 341   Basic Metabolic Panel: Recent Labs  Lab 03/26/23 1809 03/27/23 1950 03/29/23 1155  NA 138 140 141  K 3.3* 3.9 3.5  CL 103 104 102  CO2 27 26 28   GLUCOSE 124* 123* 134*  BUN 17 11 16   CREATININE 1.18* 1.00 1.12*  CALCIUM 9.0 9.5 9.7   GFR: Estimated Creatinine Clearance: 56.4 mL/min (A) (by C-G formula based on SCr of 1.12 mg/dL (H)). Liver Function Tests: Recent Labs  Lab 03/26/23 1809 03/27/23 1950 03/29/23 1155  AST 16 19 21   ALT 14 15 17   ALKPHOS 66 61 65  BILITOT 0.5 0.9 0.5  PROT 6.9 7.4 7.9  ALBUMIN 3.8 3.9 4.1   Recent Labs  Lab 03/26/23 1809 03/27/23 1950 03/29/23 1155  LIPASE 32 28 27   Urine analysis:    Component Value Date/Time    COLORURINE COLORLESS (A) 03/29/2023 1142   APPEARANCEUR CLEAR 03/29/2023 1142   LABSPEC 1.004 (L) 03/29/2023 1142   PHURINE 6.0 03/29/2023 1142   GLUCOSEU NEGATIVE 03/29/2023 1142   HGBUR SMALL (A) 03/29/2023 1142   BILIRUBINUR NEGATIVE 03/29/2023 1142   KETONESUR NEGATIVE 03/29/2023 1142   PROTEINUR NEGATIVE 03/29/2023 1142   UROBILINOGEN 0.2 02/18/2012 1056   NITRITE NEGATIVE 03/29/2023 1142   LEUKOCYTESUR NEGATIVE 03/29/2023 1142   Radiological Exams on Admission: I have personally reviewed images CT L-SPINE NO CHARGE  Result Date: 03/28/2023 CLINICAL DATA:  Groin pain EXAM: CT LUMBAR SPINE WITHOUT CONTRAST TECHNIQUE: Multidetector CT imaging of the lumbar spine was performed without intravenous contrast administration. Multiplanar CT image reconstructions were also generated. RADIATION DOSE REDUCTION: This exam was performed according to the departmental dose-optimization program which includes automated exposure control, adjustment of the mA and/or kV according to patient size and/or use of iterative  reconstruction technique. COMPARISON:  None Available. FINDINGS: Segmentation: 5 lumbar type vertebrae. Alignment: Normal Vertebrae: No acute fracture or focal pathologic process. Paraspinal and other soft tissues: Negative Disc levels: Mild degenerative disc disease with early disc space narrowing and anterior spurring. Moderate degenerative facet disease in the mid and lower lumbar spine. No disc herniation. No central or neural foraminal narrowing. IMPRESSION: No acute bony abnormality.  Degenerative disc and facet disease. Electronically Signed   By: Charlett Nose M.D.   On: 03/28/2023 00:02   CT ABDOMEN PELVIS W CONTRAST  Result Date: 03/27/2023 CLINICAL DATA:  Left groin pain and lymphadenopathy. Evaluate for AVM or infection. EXAM: CT ABDOMEN AND PELVIS WITH CONTRAST TECHNIQUE: Multidetector CT imaging of the abdomen and pelvis was performed using the standard protocol following bolus  administration of intravenous contrast. RADIATION DOSE REDUCTION: This exam was performed according to the departmental dose-optimization program which includes automated exposure control, adjustment of the mA and/or kV according to patient size and/or use of iterative reconstruction technique. CONTRAST:  75mL OMNIPAQUE IOHEXOL 350 MG/ML SOLN COMPARISON:  CT abdomen and pelvis 03/26/2023. FINDINGS: Lower chest: There is a new trace right pleural effusion Hepatobiliary: No focal liver abnormality is seen. Status post cholecystectomy. No biliary dilatation. Pancreas: Unremarkable. No pancreatic ductal dilatation or surrounding inflammatory changes. Spleen: Status post splenectomy. Adrenals/Urinary Tract: Adrenal glands are unremarkable. Kidneys are normal, without renal calculi, focal lesion, or hydronephrosis. Bladder is unremarkable. Stomach/Bowel: Stomach is within normal limits. Appendix appears normal. No evidence of bowel wall thickening, distention, or inflammatory changes. There is diffuse colonic diverticulosis. Vascular/Lymphatic: No significant vascular findings are present. No enlarged abdominal or pelvic lymph nodes. Reproductive: Status post hysterectomy. No adnexal masses. Other: There is no ascites or free air. Central mesenteric haziness and small lymph nodes appear unchanged. There is a small fat containing umbilical hernia. Musculoskeletal: No fracture is seen. IMPRESSION: 1. No acute localizing process in the abdomen or pelvis. 2. New trace right pleural effusion. 3. Colonic diverticulosis. 4. Stable central mesenteric haziness and small lymph nodes, likely mesenteric panniculitis. Electronically Signed   By: Darliss Cheney M.D.   On: 03/27/2023 23:42    EKG: My personal interpretation of EKG shows: NSR  Assessment/Plan Principal Problem:   Mesenteric panniculitis (HCC) Active Problems:   Diet-controlled type 2 diabetes mellitus (HCC)   Hyperlipidemia   Essential hypertension   GERD    Chronic diastolic CHF (congestive heart failure) (HCC)   Obesity (BMI 30-39.9)   Assessment and Plan: * Mesenteric panniculitis (HCC) Observation medical bed.  Continue with IV as needed Dilaudid for severe pain, p.o. Dilaudid as needed for moderate pain.  Will give her 1 dose of IV Decadron 10 mg once.  Will need to reevaluate her abdominal pain in the morning to see if steroids have helped.  Have sent off a CRP and ESR to evaluate for inflammation.  No evidence of any vasculitis when she had her CT scan done on 03/27/2023.  No diverticulitis seen on CT 2 days ago.  She has a normal white count without fever.  I do not think she needs antibiotics.  I do not think she has an active infection.  Clear liquid diet for now.  Obesity (BMI 30-39.9) BMI 31.01  Chronic diastolic CHF (congestive heart failure) (HCC) Euvolemic.  Continue with Lopressor twice daily.  Hold Altace and Lasix for now.  Continue with Cardizem CD 180 mg daily.  GERD Continue with Protonix 40 mg daily.  Essential hypertension Continue with Lopressor 25  mg twice daily.  Hold Lasix and Altace for now.  Continue with Cardizem CD 180 mg daily.  Hyperlipidemia Continue Crestor 20 mg daily.  Diet-controlled type 2 diabetes mellitus (HCC) Stable.  On clear liquid diet.  Add sliding scale insulin.   DVT prophylaxis: SQ Heparin Code Status: Full Code Family Communication: no family at bedside. Pt is decisional. She states her husband having surgery today  Disposition Plan: return home  Consults called: none  Admission status: Observation, Med-Surg  Carollee Herter, DO [facility] 03/29/2023, 2:54 PM

## 2023-03-29 NOTE — Assessment & Plan Note (Signed)
Continue with Protonix 40 mg daily

## 2023-03-29 NOTE — Assessment & Plan Note (Signed)
Continue Crestor 20 mg daily.

## 2023-03-29 NOTE — Assessment & Plan Note (Signed)
Stable.  On clear liquid diet.  Add sliding scale insulin.

## 2023-03-29 NOTE — Progress Notes (Signed)
Pt received solumedrol as part of CT prep at 1830. RN notified pharmacy and CT.

## 2023-03-29 NOTE — Assessment & Plan Note (Signed)
Observation medical bed.  Continue with IV as needed Dilaudid for severe pain, p.o. Dilaudid as needed for moderate pain.  Will give her 1 dose of IV Decadron 10 mg once.  Will need to reevaluate her abdominal pain in the morning to see if steroids have helped.  Have sent off a CRP and ESR to evaluate for inflammation.  No evidence of any vasculitis when she had her CT scan done on 03/27/2023.  No diverticulitis seen on CT 2 days ago.  She has a normal white count without fever.  I do not think she needs antibiotics.  I do not think she has an active infection.  Clear liquid diet for now.

## 2023-03-29 NOTE — ED Notes (Signed)
Pt ambulated to the bathroom independently.

## 2023-03-29 NOTE — Assessment & Plan Note (Signed)
BMI 31.01

## 2023-03-29 NOTE — ED Triage Notes (Addendum)
Pt c/o left lower abdominal pain radiating down left leg. Pt states she was seen for same 2 days ago and pain has worsened. Pt has numbness and tingling in left leg. Pt has new onset of urinary incontinence.  Pt has a femoral aneurysm.

## 2023-03-29 NOTE — ED Provider Notes (Signed)
[place] EMERGENCY DEPARTMENT AT [facility] Provider Note   CSN: [id number] Arrival date & time: 03/29/23  1142     History  Chief Complaint  Patient presents with   Abdominal Pain    [name] is a 67 y.o. female.  Patient is a 67 year old female with a past medical history of hypertension diabetes presenting to the emergency department with abdominal pain.  The patient states that she has been having pain ongoing for the last 4 days.  This is her third emergency department visit for the same pain.  She states that she was diagnosed with mesenteric adenitis and was sent home with oxycodone that she has been taking for pain but has had no relief.  She states that the pain is diffuse and is worse in the left lower quadrant.  She states that she has nausea but denies any vomiting.  She states that the pain is radiating down her left leg.  She denies any back pain, trauma or falls.  She denies any fevers.  She denies any diarrhea or constipation.  The history is provided by the patient.  Abdominal Pain      Home Medications Prior to Admission medications   Medication Sig Start Date End Date Taking? Authorizing Provider  acetaminophen (TYLENOL) 500 MG tablet Take 1,000 mg by mouth daily as needed (for pain).    [provider]  albuterol (VENTOLIN HFA) 108 (90 Base) MCG/ACT inhaler Inhale 2 puffs into the lungs every 6 (six) hours as needed for wheezing or shortness of breath.  02/08/12   [provider]  clonazePAM (KLONOPIN) 0.5 MG tablet Take 2 tablets (1 mg total) by mouth at bedtime. For sleep REM BD Patient taking differently: Take 0.75 mg by mouth at bedtime. Take 1.5 tablets by mouth for sleep REM BD 02/07/23   Dohmeier, Porfirio Mylar, MD  diltiazem (DILACOR XR) 180 MG 24 hr capsule Take 180 mg by mouth daily.    [provider]  EPINEPHrine 0.3 mg/0.3 mL IJ SOAJ injection Inject 0.3 mLs as directed as needed (for an anaphylactic reaction).   02/14/13   [provider]  Eyelid Cleansers (AVENOVA) 0.01 % SOLN Place 1 application into the right eye daily.  08/16/17   [provider]  famotidine (PEPCID) 40 MG tablet Take 40 mg by mouth daily. 09/25/18   [provider]  fluticasone (FLONASE) 50 MCG/ACT nasal spray Place 1 spray into both nostrils daily as needed for allergies or rhinitis.     [provider]  furosemide (LASIX) 40 MG tablet Take 40 mg by mouth daily.  08/07/13   [provider]  meclizine (ANTIVERT) 25 MG tablet Take 25 mg by mouth daily as needed for dizziness. 11/17/20   [provider]  methocarbamol (ROBAXIN) 750 MG tablet Take 1 tablet (750 mg total) by mouth 3 (three) times daily as needed (muscle spasm/pain). 03/26/23   [name] Laine, MD  metoprolol tartrate (LOPRESSOR) 25 MG tablet Take 1 tablet (25 mg total) by mouth 2 (two) times daily. 08/26/22   Rollene Rotunda, MD  montelukast (SINGULAIR) 10 MG tablet Take 10 mg by mouth at bedtime.  02/21/15   [provider]  Multiple Vitamin (MULTIVITAMIN WITH MINERALS) TABS tablet Take 1 tablet by mouth daily.    [provider]  oxyCODONE-acetaminophen (PERCOCET/ROXICET) 5-325 MG tablet Take 1 tablet by mouth every 6 (six) hours as needed for severe pain (pain score 7-10). 03/27/23   Gerhard Munch, MD  pantoprazole (  PROTONIX) 40 MG tablet Take 40 mg by mouth daily.    [provider]  ramipril (ALTACE) 5 MG capsule Take 1 capsule (5 mg total) by mouth daily. 08/26/22   Rollene Rotunda, MD  rosuvastatin (CRESTOR) 20 MG tablet Take 10 mg by mouth daily. 08/07/13   [provider]  saccharomyces boulardii (FLORASTOR) 250 MG capsule Take 250 mg by mouth daily. 04/20/18   [provider]  senna-docusate (SENOKOT-S) 8.6-50 MG tablet Take 2 tablets by mouth daily for 10 days. 03/27/23 04/06/23  Gerhard Munch, MD  Monte Fantasia INHUB 250-50 MCG/ACT AEPB Inhale 1 puff into the lungs daily as needed  (for shortness of breath). 03/08/23   [provider]  XIIDRA 5 % SOLN Apply 1 drop to eye 2 (two) times daily. 01/30/23   [provider]      Allergies    Aspirin, Bee venom, Demerol [meperidine], Ibuprofen, Nsaids, Iohexol, Amitriptyline, Ciprofloxacin, Clindamycin/lincomycin, Codeine, Ivp dye [iodinated contrast media], Mupirocin, Neosporin [neomycin-bacitracin zn-polymyx], Pollen extract, Sulfonamide derivatives, Tramadol, Cephalosporins, Nystatin, and Penicillins    Review of Systems   Review of Systems  Gastrointestinal:  Positive for abdominal pain.    Physical Exam Updated Vital Signs BP (!) 148/74   Pulse 70   Temp 97.7 F (36.5 C)   Resp 18   Ht 5\' 8"  (1.727 m)   Wt 92.5 kg   SpO2 100%   BMI 31.01 kg/m  Physical Exam Vitals and nursing note reviewed.  Constitutional:      General: She is not in acute distress.    Appearance: She is well-developed.  HENT:     Head: Normocephalic and atraumatic.     Mouth/Throat:     Mouth: Mucous membranes are moist.  Eyes:     Extraocular Movements: Extraocular movements intact.  Cardiovascular:     Rate and Rhythm: Normal rate and regular rhythm.  Abdominal:     General: Abdomen is flat.     Palpations: Abdomen is soft.     Tenderness: There is generalized abdominal tenderness and tenderness in the left lower quadrant. There is guarding. There is no right CVA tenderness, left CVA tenderness or rebound.  Musculoskeletal:     Comments: No midline back tenderness, no lumbar paraspinal muscle tenderness No pain with internal/external rotation of L hip, increased pain with L hip flexion   Skin:    General: Skin is warm and dry.  Neurological:     General: No focal deficit present.     Mental Status: She is alert and oriented to person, place, and time.     ED Results / Procedures / Treatments   Labs (all labs ordered are listed, but only abnormal results are displayed) Labs Reviewed  COMPREHENSIVE  METABOLIC PANEL - Abnormal; Notable for the following components:      Result Value   Glucose, Bld 134 (*)    Creatinine, Ser 1.12 (*)    GFR, Estimated 53 (*)    All other components within normal limits  URINALYSIS, ROUTINE W REFLEX MICROSCOPIC - Abnormal; Notable for the following components:   Color, Urine COLORLESS (*)    Specific Gravity, Urine 1.004 (*)    Hgb urine dipstick SMALL (*)    All other components within normal limits  LIPASE, BLOOD  CBC  SEDIMENTATION RATE  C-REACTIVE PROTEIN    EKG EKG Interpretation Date/Time:  Wednesday March 29 2023 11:54:59 EDT Ventricular Rate:  65 PR Interval:  136 QRS Duration:  72 QT Interval:  386  QTC Calculation: 401 R Axis:   76  Text Interpretation: Sinus rhythm with occasional Premature ventricular complexes Low voltage QRS Septal infarct , age undetermined Abnormal ECG No significant change since last tracing Confirmed by Elayne Snare ([id number]) on 03/29/2023 12:53:47 PM  Radiology CT L-SPINE NO CHARGE  Result Date: 03/28/2023 CLINICAL DATA:  Groin pain EXAM: CT LUMBAR SPINE WITHOUT CONTRAST TECHNIQUE: Multidetector CT imaging of the lumbar spine was performed without intravenous contrast administration. Multiplanar CT image reconstructions were also generated. RADIATION DOSE REDUCTION: This exam was performed according to the departmental dose-optimization program which includes automated exposure control, adjustment of the mA and/or kV according to patient size and/or use of iterative reconstruction technique. COMPARISON:  None Available. FINDINGS: Segmentation: 5 lumbar type vertebrae. Alignment: Normal Vertebrae: No acute fracture or focal pathologic process. Paraspinal and other soft tissues: Negative Disc levels: Mild degenerative disc disease with early disc space narrowing and anterior spurring. Moderate degenerative facet disease in the mid and lower lumbar spine. No disc herniation. No central or neural foraminal  narrowing. IMPRESSION: No acute bony abnormality.  Degenerative disc and facet disease. Electronically Signed   By: Charlett Nose M.D.   On: 03/28/2023 00:02   CT ABDOMEN PELVIS W CONTRAST  Result Date: 03/27/2023 CLINICAL DATA:  Left groin pain and lymphadenopathy. Evaluate for AVM or infection. EXAM: CT ABDOMEN AND PELVIS WITH CONTRAST TECHNIQUE: Multidetector CT imaging of the abdomen and pelvis was performed using the standard protocol following bolus administration of intravenous contrast. RADIATION DOSE REDUCTION: This exam was performed according to the departmental dose-optimization program which includes automated exposure control, adjustment of the mA and/or kV according to patient size and/or use of iterative reconstruction technique. CONTRAST:  75mL OMNIPAQUE IOHEXOL 350 MG/ML SOLN COMPARISON:  CT abdomen and pelvis 03/26/2023. FINDINGS: Lower chest: There is a new trace right pleural effusion Hepatobiliary: No focal liver abnormality is seen. Status post cholecystectomy. No biliary dilatation. Pancreas: Unremarkable. No pancreatic ductal dilatation or surrounding inflammatory changes. Spleen: Status post splenectomy. Adrenals/Urinary Tract: Adrenal glands are unremarkable. Kidneys are normal, without renal calculi, focal lesion, or hydronephrosis. Bladder is unremarkable. Stomach/Bowel: Stomach is within normal limits. Appendix appears normal. No evidence of bowel wall thickening, distention, or inflammatory changes. There is diffuse colonic diverticulosis. Vascular/Lymphatic: No significant vascular findings are present. No enlarged abdominal or pelvic lymph nodes. Reproductive: Status post hysterectomy. No adnexal masses. Other: There is no ascites or free air. Central mesenteric haziness and small lymph nodes appear unchanged. There is a small fat containing umbilical hernia. Musculoskeletal: No fracture is seen. IMPRESSION: 1. No acute localizing process in the abdomen or pelvis. 2. New trace  right pleural effusion. 3. Colonic diverticulosis. 4. Stable central mesenteric haziness and small lymph nodes, likely mesenteric panniculitis. Electronically Signed   By: Darliss Cheney M.D.   On: 03/27/2023 23:42    Procedures Procedures    Medications Ordered in ED Medications  HYDROmorphone (DILAUDID) injection 1 mg (1 mg Intravenous Given 03/29/23 1355)  ondansetron (ZOFRAN) injection 4 mg (4 mg Intravenous Given 03/29/23 1355)    ED Course/ Medical Decision Making/ A&P                                 Medical Decision Making This patient presents to the ED with chief complaint(s) of abdominal pain with pertinent past medical history of DM, HTN, a fib which further complicates the presenting complaint. The complaint involves an extensive differential  diagnosis and also carries with it a high risk of complications and morbidity.    The differential diagnosis includes mesenteritis adenitis, panniculitis, no signs of peritonitis on exam, considering a component of sciatica, recent negative CT's for appendicitis or diverticulitis with no fever or signs of sepsis making other intraabdominal infection unlikely, UTI   Additional history obtained: Additional history obtained from N/A Records reviewed Care Everywhere/External Records, Primary Care Documents, and multiple recent ED records  ED Course and Reassessment: On patient's arrival she is hemodynamically stable in no acute distress.  She was initially evaluated by triage and had labs and urine performed.  Labs and urine showed no acute disease, no signs of worsening infection or dehydration.  Patient's abdomen is soft but diffusely tender and worse in the left lower quadrant, consistent with her exam and her last 2 ED visits.  She had CT scan performed on 10/13 and 10/14 that both confirmed mesenteric adenitis with possible panniculitis without complication.  Patient has been on narcotic pain medication at home and due to failed pain  control, likely will require admission for IV pain medication.  She was given Dilaudid and Zofran for symptomatic management.  Independent labs interpretation:  The following labs were independently interpreted: Within normal range  Independent visualization of imaging: - N/A  Consultation: - Consulted or discussed management/test interpretation w/ external professional: hospitalist  Consideration for admission or further workup: patient requires admission for intractable pain Social Determinants of health: N/A    Amount and/or Complexity of Data Reviewed Labs: ordered.  Risk Prescription drug management.          Final Clinical Impression(s) / ED Diagnoses Final diagnoses:  Mesenteric adenitis  Intractable pain    Rx / DC Orders ED Discharge Orders     None         Rexford Maus, DO 03/29/23 1401

## 2023-03-29 NOTE — Hospital Course (Signed)
CC: abd pain HPI: 67 year old female history of obesity, hypertension, diet-controlled diabetes presents to the ER today with about a 1 month history of abdominal pain.  She was evaluated about a month ago by her PCP/GI doctor.  Was given a 10-day course of Cipro and Flagyl.  She says that she had some initial improvement but her abdominal pain quickly returned after stopping antibiotics.  Over the last 2 to 3 days, she has been in the ER twice.  She had 2 CT scans which only showed some mesenteric panniculitis but no evidence of diverticulitis.  She has been nauseated but not having any diarrhea.  She has been given IV Dilaudid in the ER with some improvement of her abdominal pain.  She was sent home with p.o. Percocet 5 mg but this has not seemed to help her pain.  She returns to the ER today for the third time in the last 4 days due to continued abdominal pain.  She states that her initial abdominal pain was in her left lower quadrant and down her left leg.  This is since spread now to it being suprapubic, epigastric.  She has some urinary frequency but denies any dysuria.  Denies any fever or chills.  She states she had COVID about 6 weeks ago but did not have any GI symptoms from it.  She does have a history of IV contrast allergy which causes hypertension and causes her to pass out.  When she had her CT contrasted abdominal scan on 03/27/2023, she was pretreated with 40 mg of IV Solu-Medrol.  She is not sure if the IV Solu-Medrol or the IV Dilaudid helped more with her abdominal pain.  She was not discharged on any steroids.  She states that since her ER visit on the 14th, her abdominal pain has gotten worse.  On arrival temp 97.7 heart rate 70 blood pressure 140/74 satting 100% on room air.  Patient denies any history of any rheumatologic diseases.  She has no prior history of rheumatoid arthritis.  She has no history of lupus.  She has had a splenic artery aneurysm embolization but this  was in her early 1s and she was told that this was due to pregnancy.  She has not had any history of abdominal aortic aneurysms or other vasculitis.  Labs today show white count 8.0, hemoglobin 14.3, platelets of 341  Sodium 141, potassium 3.5, chloride 102, bicarb 28, BUN 16, creatinine 1.12, glucose 134  Calcium 9.7, total protein 7.9, albumin 4.1, AST 21, ALT of 17 alk phos of 65  Lipase normal at 27  Urinalysis showed a specific gravity 1.004, pH of 6, negative nitrites, negative leukocyte esterase  Patient given 1 dose of IV Dilaudid.  [facility] hospitalist consulted for admission.

## 2023-03-29 NOTE — Progress Notes (Signed)
   Pt still having more abd pain despite multiple doses of po and IV dilaudid. Will get stat CTA abd and CT abd/pelvis to evaluate for evolving abd pathology.  Carollee Herter, DO [facility]

## 2023-03-29 NOTE — ED Notes (Signed)
ED TO INPATIENT HANDOFF REPORT  ED Nurse Name and Phone #: Marchelle Folks RN [phone]  S Name/Age/Gender [name] 67 y.o. female Room/Bed: 038C/038C  Code Status   Code Status: Full Code  Home/SNF/Other Home Patient oriented to: self, place, time, and situation Is this baseline? Yes   Triage Complete: Triage complete  Chief Complaint Mesenteric panniculitis (HCC) [K65.4]  Triage Note Pt c/o left lower abdominal pain radiating down left leg. Pt states she was seen for same 2 days ago and pain has worsened. Pt has numbness and tingling in left leg. Pt has new onset of urinary incontinence.  Pt has a femoral aneurysm.    Allergies Allergies  Allergen Reactions   Aspirin Anaphylaxis   Bee Venom Anaphylaxis   Demerol [Meperidine] Anaphylaxis   Ibuprofen Anaphylaxis   Nsaids Anaphylaxis   Iohexol Other (See Comments) and Hypertension    ELEVATION OF B.P, PASSED OUT, PT. NEEDS PRE-MEDS FOR IODINE CONTRAST    Amitriptyline     Mental status changes   Clindamycin/Lincomycin     Thrush    Codeine Nausea And Vomiting   Ivp Dye [Iodinated Contrast Media] Hypertension    Flushing, also   Mupirocin Other (See Comments)    Makes more inflamed   Neosporin [Neomycin-Bacitracin Zn-Polymyx] Other (See Comments)    Makes more inflammed   Pollen Extract Other (See Comments)    Stuffy nose   Sulfonamide Derivatives Hives   Tramadol Nausea And Vomiting   Cephalosporins Swelling   Nystatin Hives    Facial swelling   Penicillins Swelling    Has patient had a PCN reaction causing immediate rash, facial/tongue/throat swelling, SOB or lightheadedness with hypotension: Yes Has patient had a PCN reaction causing severe rash involving mucus membranes or skin necrosis: No Has patient had a PCN reaction that required hospitalization: No Has patient had a PCN reaction occurring within the last 10 years: No If all of the above answers are "NO", then may proceed with Cephalosporin use.      Level of Care/Admitting Diagnosis ED Disposition     ED Disposition  Admit   Condition  --   Comment  Hospital Area: [facility] [[id number]]  Level of Care: Med-Surg [[id number]]  May place patient in observation at [facility] or [facility] if equivalent level of care is available:: No  Covid Evaluation: Asymptomatic - no recent exposure (last 10 days) testing not required  Diagnosis: Mesenteric panniculitis [facility]) [[id number]]  Admitting Physician: Imogene Burn, ERIC [[id number]]  Attending Physician: Imogene Burn, ERIC [[id number]]          B Medical/Surgery History Past Medical History:  Diagnosis Date   Atrial fibrillation (HCC)    Complication of anesthesia    " I have to be awake to be intubated because I have a mass in my throut "   Diabetes mellitus    Difficult intubation    mass in throut   Diverticulitis    Hyperlipidemia    Hypertension    Migraines 2013   Sleep apnea    CPAP   Past Surgical History:  Procedure Laterality Date   ABDOMINAL HYSTERECTOMY     BIOPSY  05/17/2022   Procedure: BIOPSY;  Surgeon: Kerin Salen, MD;  Location: [facility];  Service: Gastroenterology;;   BREAST CYST ASPIRATION Left 09/2016   COLONOSCOPY WITH PROPOFOL N/A 05/17/2022   Procedure: COLONOSCOPY WITH PROPOFOL;  Surgeon: Kerin Salen, MD;  Location: [facility];  Service: Gastroenterology;  Laterality: N/A;   GALLBLADDER SURGERY  KNEE ARTHROSCOPY     lymph nodectomy     NASAL SINUS SURGERY     POLYPECTOMY  05/17/2022   Procedure: POLYPECTOMY;  Surgeon: Kerin Salen, MD;  Location: [facility];  Service: Gastroenterology;;   SPLENIC ARTERY EMBOLIZATION     due to Aneurysm   TONSILLECTOMY       A IV Location/Drains/Wounds Patient Lines/Drains/Airways Status     Active Line/Drains/Airways     Name Placement date Placement time Site Days   Peripheral IV 03/29/23 20 G Left Antecubital 03/29/23  1348  Antecubital  less than 1            Intake/Output Last 24 hours No intake  or output data in the 24 hours ending 03/29/23 1445  Labs/Imaging Results for orders placed or performed during the hospital encounter of 03/29/23 (from the past 48 hour(s))  Urinalysis, Routine w reflex microscopic -Urine, Clean Catch     Status: Abnormal   Collection Time: 03/29/23 11:42 AM  Result Value Ref Range   Color, Urine COLORLESS (A) YELLOW   APPearance CLEAR CLEAR   Specific Gravity, Urine 1.004 (L) 1.005 - 1.030   pH 6.0 5.0 - 8.0   Glucose, UA NEGATIVE NEGATIVE mg/dL   Hgb urine dipstick SMALL (A) NEGATIVE   Bilirubin Urine NEGATIVE NEGATIVE   Ketones, ur NEGATIVE NEGATIVE mg/dL   Protein, ur NEGATIVE NEGATIVE mg/dL   Nitrite NEGATIVE NEGATIVE   Leukocytes,Ua NEGATIVE NEGATIVE   RBC / HPF 0-5 0 - 5 RBC/hpf   WBC, UA 0-5 0 - 5 WBC/hpf   Bacteria, UA NONE SEEN NONE SEEN   Squamous Epithelial / HPF 0-5 0 - 5 /HPF    Comment: Performed at [facility], [address] [place], [place] [address]  Lipase, blood     Status: None   Collection Time: 03/29/23 11:55 AM  Result Value Ref Range   Lipase 27 11 - 51 U/L    Comment: Performed at [facility], [address], [place], [place] [address]  Comprehensive metabolic panel     Status: Abnormal   Collection Time: 03/29/23 11:55 AM  Result Value Ref Range   Sodium 141 135 - 145 mmol/L   Potassium 3.5 3.5 - 5.1 mmol/L   Chloride 102 98 - 111 mmol/L   CO2 28 22 - 32 mmol/L   Glucose, Bld 134 (H) 70 - 99 mg/dL    Comment: Glucose reference range applies only to samples taken after fasting for at least 8 hours.   BUN 16 8 - 23 mg/dL   Creatinine, Ser 0.98 (H) 0.44 - 1.00 mg/dL   Calcium 9.7 8.9 - 11.9 mg/dL   Total Protein 7.9 6.5 - 8.1 g/dL   Albumin 4.1 3.5 - 5.0 g/dL   AST 21 15 - 41 U/L   ALT 17 0 - 44 U/L   Alkaline Phosphatase 65 38 - 126 U/L   Total Bilirubin 0.5 0.3 - 1.2 mg/dL   GFR, Estimated 53 (L) >60 mL/min    Comment: (NOTE) Calculated using the CKD-EPI Creatinine Equation (2021)    Anion  gap 11 5 - 15    Comment: Performed at [facility], [address], [place], [place] [address]  CBC     Status: None   Collection Time: 03/29/23 11:55 AM  Result Value Ref Range   WBC 8.0 4.0 - 10.5 K/uL   RBC 4.75 3.87 - 5.11 MIL/uL   Hemoglobin 14.3 12.0 - 15.0 g/dL  HCT 44.6 36.0 - 46.0 %   MCV 93.9 80.0 - 100.0 fL   MCH 30.1 26.0 - 34.0 pg   MCHC 32.1 30.0 - 36.0 g/dL   RDW 40.9 81.1 - 91.4 %   Platelets 341 150 - 400 K/uL   nRBC 0.0 0.0 - 0.2 %    Comment: Performed at [facility], [address] [place], [place] [address]   CT L-SPINE NO CHARGE  Result Date: 03/28/2023 CLINICAL DATA:  Groin pain EXAM: CT LUMBAR SPINE WITHOUT CONTRAST TECHNIQUE: Multidetector CT imaging of the lumbar spine was performed without intravenous contrast administration. Multiplanar CT image reconstructions were also generated. RADIATION DOSE REDUCTION: This exam was performed according to the departmental dose-optimization program which includes automated exposure control, adjustment of the mA and/or kV according to patient size and/or use of iterative reconstruction technique. COMPARISON:  None Available. FINDINGS: Segmentation: 5 lumbar type vertebrae. Alignment: Normal Vertebrae: No acute fracture or focal pathologic process. Paraspinal and other soft tissues: Negative Disc levels: Mild degenerative disc disease with early disc space narrowing and anterior spurring. Moderate degenerative facet disease in the mid and lower lumbar spine. No disc herniation. No central or neural foraminal narrowing. IMPRESSION: No acute bony abnormality.  Degenerative disc and facet disease. Electronically Signed   By: Charlett Nose M.D.   On: 03/28/2023 00:02   CT ABDOMEN PELVIS W CONTRAST  Result Date: 03/27/2023 CLINICAL DATA:  Left groin pain and lymphadenopathy. Evaluate for AVM or infection. EXAM: CT ABDOMEN AND PELVIS WITH CONTRAST TECHNIQUE: Multidetector CT imaging of the abdomen and pelvis was  performed using the standard protocol following bolus administration of intravenous contrast. RADIATION DOSE REDUCTION: This exam was performed according to the departmental dose-optimization program which includes automated exposure control, adjustment of the mA and/or kV according to patient size and/or use of iterative reconstruction technique. CONTRAST:  75mL OMNIPAQUE IOHEXOL 350 MG/ML SOLN COMPARISON:  CT abdomen and pelvis 03/26/2023. FINDINGS: Lower chest: There is a new trace right pleural effusion Hepatobiliary: No focal liver abnormality is seen. Status post cholecystectomy. No biliary dilatation. Pancreas: Unremarkable. No pancreatic ductal dilatation or surrounding inflammatory changes. Spleen: Status post splenectomy. Adrenals/Urinary Tract: Adrenal glands are unremarkable. Kidneys are normal, without renal calculi, focal lesion, or hydronephrosis. Bladder is unremarkable. Stomach/Bowel: Stomach is within normal limits. Appendix appears normal. No evidence of bowel wall thickening, distention, or inflammatory changes. There is diffuse colonic diverticulosis. Vascular/Lymphatic: No significant vascular findings are present. No enlarged abdominal or pelvic lymph nodes. Reproductive: Status post hysterectomy. No adnexal masses. Other: There is no ascites or free air. Central mesenteric haziness and small lymph nodes appear unchanged. There is a small fat containing umbilical hernia. Musculoskeletal: No fracture is seen. IMPRESSION: 1. No acute localizing process in the abdomen or pelvis. 2. New trace right pleural effusion. 3. Colonic diverticulosis. 4. Stable central mesenteric haziness and small lymph nodes, likely mesenteric panniculitis. Electronically Signed   By: Darliss Cheney M.D.   On: 03/27/2023 23:42    Pending Labs Unresulted Labs (From admission, onward)     Start     Ordered   03/29/23 1357  Sedimentation rate  Add-on,   AD        03/29/23 1356   03/29/23 1357  C-reactive protein   Add-on,   AD        03/29/23 1356            Vitals/Pain Today's Vitals   03/29/23 1148 03/29/23 1148 03/29/23 1149  BP:  Marland Kitchen)  148/74   Pulse:  70   Resp:  18   Temp:  97.7 F (36.5 C)   SpO2:  100%   Weight:   92.5 kg  Height:   5\' 8"  (1.727 m)  PainSc: 10-Worst pain ever      Isolation Precautions No active isolations  Medications Medications  dexamethasone (DECADRON) injection 10 mg (has no administration in time range)  HYDROmorphone (DILAUDID) injection 1 mg (1 mg Intravenous Given 03/29/23 1355)  ondansetron (ZOFRAN) injection 4 mg (4 mg Intravenous Given 03/29/23 1355)    Mobility walks     Focused Assessments Pain management    R Recommendations: See Admitting Provider Note  Report given to:   Additional Notes:

## 2023-03-29 NOTE — Assessment & Plan Note (Signed)
Continue with Lopressor 25 mg twice daily.  Hold Lasix and Altace for now.  Continue with Cardizem CD 180 mg daily.

## 2023-03-29 NOTE — Assessment & Plan Note (Signed)
Euvolemic.  Continue with Lopressor twice daily.  Hold Altace and Lasix for now.  Continue with Cardizem CD 180 mg daily.

## 2023-03-30 DIAGNOSIS — K654 Sclerosing mesenteritis: Secondary | ICD-10-CM | POA: Diagnosis not present

## 2023-03-30 DIAGNOSIS — K219 Gastro-esophageal reflux disease without esophagitis: Secondary | ICD-10-CM | POA: Diagnosis not present

## 2023-03-30 DIAGNOSIS — E669 Obesity, unspecified: Secondary | ICD-10-CM | POA: Diagnosis not present

## 2023-03-30 DIAGNOSIS — I5032 Chronic diastolic (congestive) heart failure: Secondary | ICD-10-CM | POA: Diagnosis not present

## 2023-03-30 LAB — COMPREHENSIVE METABOLIC PANEL
ALT: 19 U/L (ref 0–44)
AST: 19 U/L (ref 15–41)
Albumin: 3.6 g/dL (ref 3.5–5.0)
Alkaline Phosphatase: 61 U/L (ref 38–126)
Anion gap: 12 (ref 5–15)
BUN: 17 mg/dL (ref 8–23)
CO2: 25 mmol/L (ref 22–32)
Calcium: 9.2 mg/dL (ref 8.9–10.3)
Chloride: 99 mmol/L (ref 98–111)
Creatinine, Ser: 1 mg/dL (ref 0.44–1.00)
GFR, Estimated: >60 mL/min (ref >60)
Glucose, Bld: 184 mg/dL — ABNORMAL HIGH (ref 70–99)
Potassium: 4 mmol/L (ref 3.5–5.1)
Sodium: 136 mmol/L (ref 135–145)
Total Bilirubin: 0.6 mg/dL (ref 0.3–1.2)
Total Protein: 7.1 g/dL (ref 6.5–8.1)

## 2023-03-30 LAB — CBC WITH DIFFERENTIAL/PLATELET
Abs Immature Granulocytes: 0 10*3/uL (ref 0.00–0.07)
Basophils Absolute: 0 10*3/uL (ref 0.0–0.1)
Basophils Relative: 0 %
Eosinophils Absolute: 0 10*3/uL (ref 0.0–0.5)
Eosinophils Relative: 0 %
HCT: 42.7 % (ref 36.0–46.0)
Hemoglobin: 14.1 g/dL (ref 12.0–15.0)
Lymphocytes Relative: 12 %
Lymphs Abs: 0.8 10*3/uL (ref 0.7–4.0)
MCH: 30.5 pg (ref 26.0–34.0)
MCHC: 33 g/dL (ref 30.0–36.0)
MCV: 92.2 fL (ref 80.0–100.0)
Monocytes Absolute: 0 10*3/uL — ABNORMAL LOW (ref 0.1–1.0)
Monocytes Relative: 0 %
Neutro Abs: 5.5 10*3/uL (ref 1.7–7.7)
Neutrophils Relative %: 88 %
Platelets: 314 10*3/uL (ref 150–400)
RBC: 4.63 MIL/uL (ref 3.87–5.11)
RDW: 13.8 % (ref 11.5–15.5)
WBC: 6.3 10*3/uL (ref 4.0–10.5)
nRBC: 0 % (ref 0.0–0.2)
nRBC: 0 /100{WBCs}

## 2023-03-30 LAB — HEMOGLOBIN A1C
Hgb A1c MFr Bld: 6.9 % — ABNORMAL HIGH (ref 4.8–5.6)
Mean Plasma Glucose: 151.33 mg/dL

## 2023-03-30 LAB — PROCALCITONIN: Procalcitonin: <0.1 ng/mL

## 2023-03-30 MED ORDER — PREDNISONE 10 MG PO TABS
50.0000 mg | ORAL_TABLET | Freq: Every day | ORAL | Status: DC
  Administered 2023-03-30: 50 mg via ORAL
  Filled 2023-03-30: qty 5

## 2023-03-30 MED ORDER — PREDNISONE 10 MG PO TABS: ORAL_TABLET | ORAL | 0 refills | Status: AC

## 2023-03-30 MED ORDER — GABAPENTIN 300 MG PO CAPS: 300.0000 mg | ORAL_CAPSULE | Freq: Two times a day (BID) | ORAL | 0 refills | Status: DC

## 2023-03-30 MED ORDER — SENNOSIDES-DOCUSATE SODIUM 8.6-50 MG PO TABS: 2.0000 | ORAL_TABLET | Freq: Two times a day (BID) | ORAL | Status: DC | PRN

## 2023-03-30 NOTE — Discharge Summary (Signed)
Physician Discharge Summary  [name]:[id number] DOB: Aug 24, 1955 DOA: 03/29/2023  PCP: Chilton Greathouse, MD  Admit date: 03/29/2023 Discharge date: 03/30/2023 Admitted From: Home Disposition: Home Recommendations for Outpatient Follow-up:  Follow up with PCP in 1 week Check CMP and CBC at follow-up Please follow up on the following pending results: None  Home Health: Not indicated Equipment/Devices: Not indicated  Discharge Condition: Stable CODE STATUS: Full code  Follow-up Information     Avva, Ravisankar, MD. Schedule an appointment as soon as possible for a visit in 1 week(s).   Specialty: Internal Medicine Contact information: [address] [place] [address] [phone]                 Hospital course 67 year old F with PMH of diet-controlled DM-2, HTN, OSA, IBS, PAF not on AC, diverticulosis, obesity, chronic pain, osteoarthritis, splenic artery embolization and migraine presenting with increased abdominal pain for about a month with associated nausea.  She was seen outpatient and treated with 10 days of Cipro and Flagyl with initial improvement in her pain.  However, pain returned after stopping antibiotics and had been in ER twice in the last 2 to 3 days.  CT scan raise concern for mesenteric pancolitis but no evidence of diverticulitis.  She was given IV Dilaudid and discharged home on p.o. Percocet that seems to have helped her pain.  She returned to ED for the third time in the last 4 days due to continued abdominal pain in LLQ radiating to epigastric area and left thigh.  She also reports history of spinal canal stenosis.  She reports some urinary frequency but denies dysuria or hematuria.  She has no fever or chills.  She has no diarrhea, melena or hematochezia.  In ED, stable vitals.  Workup including CMP, CBC, lipase and UA unrevealing.  CRP and ESR within normal.  CT angio abdomen and pelvis with and without contrast obtained, and showed  possible gastritis and stable haziness and small lymph nodes in the mesenteric root fat which could be seen with mesenteric adenitis, mesenteric pancolitis and fibrosing mesenteritis although these findings were noted on CT abdomen and pelvis on 01/28/2011.  CT also showed osteopenia and lumbar degenerative changes.  Patient received IV Decadron.   The next day, patient's abdominal pain improved.  She stated that her pain was 10/10 on presentation and improved to 4/10 today.  She getting the steroid helped.  She has no further nausea.  She tolerated a regular diet.  There is question about some degree of lumbar radiculopathy contributing to his symptoms of LLQ, left flank and left thigh pain.  Patient does a lot of heavy lifting as she has to look after her husband who has bilateral BKA and ESRD on peritoneal dialysis.  She is discharged on prednisone taper and gabapentin.  She is already on Pepcid and PPI for gastritis which could serve GI prophylaxis.  Patient's hemoglobin A1c returned at 6.9%.  She is not on any medication.  She has been advised to maintain good hydration especially with steroid.   See individual problem list below for more.   Problems addressed during this hospitalization Principal Problem:   Mesenteric panniculitis (HCC) Active Problems:   Diet-controlled type 2 diabetes mellitus (HCC)   Hyperlipidemia   Essential hypertension   GERD   Chronic diastolic CHF (congestive heart failure) (HCC)   Obesity (BMI 30-39.9)   LLQ abdominal pain/left flank pain/left thigh pain: Unclear etiology of this.  Workup unrevealing.  Question about  some element of radiculopathy given history of osteoarthritis and heavy lifting.  She also have history of IBS but pain should not be localized to the left.  She has no CVA tenderness to suggest pyelonephritis.  Low suspicion for UTI.  Her UA is clean.  She has no fever either. -Discharged on prednisone taper, gabapentin and home pain  medications -Outpatient follow-up with PCP and GI if it does not improve with the above intervention  Obesity (BMI 30-39.9) BMI 31.01   Chronic diastolic CHF: Appears euvolemic.  No cardiopulmonary symptoms.     GERD/gastritis -Continue Pepcid and Protonix   Essential hypertension: Normotensive -Continue home meds   Hyperlipidemia -Continue Crestor 20 mg daily.   Diet controlled DM-2 with hyperglycemia: A1c 6.9%. -Encouraged good hydration and lifestyle change           Time spent 35 minutes  Vital signs Vitals:   03/29/23 1540 03/29/23 1927 03/30/23 0326 03/30/23 0802  BP: 125/75 112/69 117/72 116/77  Pulse: (!) 58 61 62 63  Temp: 98.2 F (36.8 C) 98.1 F (36.7 C) 98.5 F (36.9 C) 98 F (36.7 C)  Resp: 16 18 17 18   Height:      Weight:      SpO2: 97% 94% 94% 96%  TempSrc: Oral Oral Oral   BMI (Calculated):         Discharge exam  GENERAL: No apparent distress.  Nontoxic. HEENT: MMM.  Vision and hearing grossly intact.  NECK: Supple.  No apparent JVD.  RESP:  No IWOB.  Fair aeration bilaterally. CVS:  RRR. Heart sounds normal.  ABD/GI/GU: BS+. Abd soft, NTND.  MSK/EXT:  Moves extremities. No apparent deformity. No edema.  SKIN: no apparent skin lesion or wound NEURO: Awake and alert. Oriented appropriately.  No apparent focal neuro deficit. PSYCH: Calm. Normal affect.   Discharge Instructions Discharge Instructions     Diet - low sodium heart healthy   Complete by: As directed    Diet Carb Modified   Complete by: As directed    Discharge instructions   Complete by: As directed    It has been a pleasure taking care of you!  You were hospitalized due to left-sided abdominal pain, left flank and left thigh pain.  It is not quite clear what is causing your pain. Your blood work and CT abdomen and pelvis did not show anything acute to explain her symptoms.  There is a question if this could be related to your back or nerve impingement.  Your symptoms  improved with the steroid.  We are discharging you on more steroid to complete treatment course.  Maintain good hydration.  We also recommend you continue acid reflux medication while on steroid.  Follow-up with your primary care doctor in 1 to 2 weeks or sooner if needed.  We also recommend you follow-up with a gastroenterologist if your abdominal pain persists.  It is also very important that you have a regular bowel movement while on pain medications.  You may use Senokot as needed for constipation.   Take care,   Increase activity slowly   Complete by: As directed       Allergies as of 03/30/2023       Reactions   Aspirin Anaphylaxis   Bee Venom Anaphylaxis   Demerol [meperidine] Anaphylaxis   Ibuprofen Anaphylaxis   Nsaids Anaphylaxis   Iohexol Other (See Comments), Hypertension   ELEVATION OF B.P, PASSED OUT, PT. NEEDS PRE-MEDS FOR IODINE CONTRAST   Amitriptyline  Mental status changes   Clindamycin/lincomycin    Thrush    Codeine Nausea And Vomiting   Ivp Dye [iodinated Contrast Media] Hypertension   Flushing, also   Mupirocin Other (See Comments)   Makes more inflamed   Neosporin [neomycin-bacitracin Zn-polymyx] Other (See Comments)   Makes more inflammed   Pollen Extract Other (See Comments)   Stuffy nose   Sulfonamide Derivatives Hives   Tramadol Nausea And Vomiting   Cephalosporins Swelling   Nystatin Hives   Facial swelling   Penicillins Swelling   Has patient had a PCN reaction causing immediate rash, facial/tongue/throat swelling, SOB or lightheadedness with hypotension: Yes Has patient had a PCN reaction causing severe rash involving mucus membranes or skin necrosis: No Has patient had a PCN reaction that required hospitalization: No Has patient had a PCN reaction occurring within the last 10 years: No If all of the above answers are "NO", then may proceed with Cephalosporin use.        Medication List     TAKE these medications    acetaminophen  500 MG tablet Commonly known as: TYLENOL Take 1,000 mg by mouth daily as needed (for pain).   albuterol 108 (90 Base) MCG/ACT inhaler Commonly known as: VENTOLIN HFA Inhale 2 puffs into the lungs every 6 (six) hours as needed for wheezing or shortness of breath.   Avenova 0.01 % Soln Place 1 application into the right eye daily.   clonazePAM 0.5 MG tablet Commonly known as: KLONOPIN Take 2 tablets (1 mg total) by mouth at bedtime. For sleep REM BD What changed:  how much to take additional instructions   diltiazem 180 MG 24 hr capsule Commonly known as: DILACOR XR Take 180 mg by mouth daily.   EPINEPHrine 0.3 mg/0.3 mL Soaj injection Commonly known as: EPI-PEN Inject 0.3 mLs as directed as needed (for an anaphylactic reaction).   famotidine 40 MG tablet Commonly known as: PEPCID Take 40 mg by mouth daily.   Florastor 250 MG capsule Generic drug: saccharomyces boulardii Take 250 mg by mouth daily.   fluticasone 50 MCG/ACT nasal spray Commonly known as: FLONASE Place 1 spray into both nostrils daily as needed for allergies or rhinitis.   furosemide 40 MG tablet Commonly known as: LASIX Take 40 mg by mouth daily.   gabapentin 300 MG capsule Commonly known as: Neurontin Take 1 capsule (300 mg total) by mouth 2 (two) times daily.   methocarbamol 750 MG tablet Commonly known as: ROBAXIN Take 1 tablet (750 mg total) by mouth 3 (three) times daily as needed (muscle spasm/pain).   metoprolol tartrate 25 MG tablet Commonly known as: LOPRESSOR Take 1 tablet (25 mg total) by mouth 2 (two) times daily.   montelukast 10 MG tablet Commonly known as: SINGULAIR Take 10 mg by mouth at bedtime.   multivitamin with minerals Tabs tablet Take 1 tablet by mouth daily.   oxyCODONE-acetaminophen 5-325 MG tablet Commonly known as: PERCOCET/ROXICET Take 1 tablet by mouth every 6 (six) hours as needed for severe pain (pain score 7-10).   pantoprazole 40 MG tablet Commonly known  as: PROTONIX Take 40 mg by mouth daily.   predniSONE 10 MG tablet Commonly known as: DELTASONE Take 6 tablets (60 mg total) by mouth daily for 2 days, THEN 5 tablets (50 mg total) daily for 1 day, THEN 4 tablets (40 mg total) daily for 1 day, THEN 3 tablets (30 mg total) daily for 1 day, THEN 2 tablets (20 mg total) daily for 1 day,  THEN 1 tablet (10 mg total) daily for 1 day. Start taking on: March 30, 2023   ramipril 5 MG capsule Commonly known as: ALTACE Take 1 capsule (5 mg total) by mouth daily.   rosuvastatin 20 MG tablet Commonly known as: CRESTOR Take 10 mg by mouth daily.   senna-docusate 8.6-50 MG tablet Commonly known as: Senokot-S Take 2 tablets by mouth 2 (two) times daily between meals as needed for mild constipation.   Wixela Inhub 250-50 MCG/ACT Aepb Generic drug: fluticasone-salmeterol Inhale 1 puff into the lungs daily as needed (for shortness of breath).   Xiidra 5 % Soln Generic drug: Lifitegrast Apply 1 drop to eye 2 (two) times daily.        Consultations: None  Procedures/Studies:   CT Angio Abd/Pel w/ and/or w/o  Result Date: 03/30/2023 CLINICAL DATA:  Mesenteric ischemia clinically suspected. Patient experiencing abdominal pain. EXAM: CTA ABDOMEN AND PELVIS WITHOUT AND WITH CONTRAST TECHNIQUE: Multidetector CT imaging of the abdomen and pelvis was performed using the standard protocol during bolus administration of intravenous contrast. Multiplanar reconstructed images and MIPs were obtained and reviewed to evaluate the vascular anatomy. RADIATION DOSE REDUCTION: This exam was performed according to the departmental dose-optimization program which includes automated exposure control, adjustment of the mA and/or kV according to patient size and/or use of iterative reconstruction technique. CONTRAST:  75mL OMNIPAQUE IOHEXOL 350 MG/ML SOLN COMPARISON:  Multiple prior CTs. The 2 most recent are CT with IV contrast 03/27/2023, CT without contrast  03/26/2023. No prior CTA of the abdomen and pelvis. FINDINGS: VASCULAR Aorta: Normal caliber aorta without aneurysm, dissection, vasculitis or significant stenosis. There are minimal scattered nonstenosing calcific plaques. Celiac: Patent without evidence of aneurysm, dissection, vasculitis or significant stenosis. No branch occlusion. SMA: Normal.  No branch occlusion. Renals: Left renal artery is single. On the right, a dominant artery supplies the upper to midpole and arises first. 2.6 cm below this, a hypoplastic artery arises from the right anterior aorta and supplies the lower pole. All 3 vessels widely patent. There is trace calcific plaque at the dominant right renal artery ostium. No branch occlusions bilaterally. IMA: Patent without evidence of aneurysm, dissection, vasculitis or significant stenosis. No branch vessel occlusions. Inflow: Patent without evidence of aneurysm, dissection, vasculitis or significant stenosis. Proximal Outflow: Bilateral common femoral and visualized portions of the superficial and profunda femoral arteries are patent without evidence of aneurysm, dissection, vasculitis or significant stenosis. Veins: Patent. Review of the MIP images confirms the above findings. NON-VASCULAR Lower chest: Lung bases show mild chronic changes without acute process. The cardiac size is normal. There previously was a trace right pleural effusion which has cleared. Hepatobiliary: The liver is 19 cm length mildly steatotic without mass enhancement. Gallbladder again noted absent without biliary dilatation. Pancreas: No abnormality. Spleen: Surgically absent. There is a 2 cm splenule in the subphrenic space. Adrenals/Urinary Tract: Adrenal glands are unremarkable. Kidneys are normal, without renal calculi, focal lesion, or hydronephrosis. Bladder is unremarkable. Stomach/Bowel: No contrast extravasation is seen into the bowel. There are thickened folds in the stomach especially proximally. The  unopacified small bowel is unremarkable. The appendix is subcecal and normal caliber. There is diffuse colonic diverticulosis greatest in the sigmoid. No evidence of acute diverticulitis or focal colitis. No bowel pneumatosis is seen. Haziness and multiple small lymph nodes in the mesenteric root fat is again noted. Lymphatic: No appreciable adenopathy. Reproductive: Status post hysterectomy. No adnexal masses. Other: Umbilical rectus diastasis, moderate-sized umbilical fat hernia. No incarcerated hernia.  No ascites, hemorrhage or free air. Musculoskeletal: Osteopenia and degenerative change lumbar spine. No acute or significant osseous findings. IMPRESSION: 1. The mesenteric vessels including the SMV, SMA and IMA, all opacify well. No branch vessel occlusions are identified. No bowel pneumatosis or portal venous gas. 2. Minimal aortic and right renal artery atherosclerosis. No significant visceral arterial stenoses. 3. No contrast extravasation into the bowel is seen. 4. Thickened folds in the stomach consistent with gastritis. 5. Diverticulosis without evidence of diverticulitis. 6. Stable haziness and small lymph nodes in the mesenteric root fat. Findings could be seen with mesenteric adenitis, mesenteric panniculitis, and fibrosing mesenteritis. Similar findings were noted however, on the oldest available CT study from 01/28/2011. 7. Umbilical fat hernia. 8. Mildly prominent liver with mild steatosis. 9. Prior splenectomy with 2 cm splenule in the subphrenic space. 10. Osteopenia and degenerative change. Electronically Signed   By: Almira Bar M.D.   On: 03/30/2023 04:02   CT L-SPINE NO CHARGE  Result Date: 03/28/2023 CLINICAL DATA:  Groin pain EXAM: CT LUMBAR SPINE WITHOUT CONTRAST TECHNIQUE: Multidetector CT imaging of the lumbar spine was performed without intravenous contrast administration. Multiplanar CT image reconstructions were also generated. RADIATION DOSE REDUCTION: This exam was performed  according to the departmental dose-optimization program which includes automated exposure control, adjustment of the mA and/or kV according to patient size and/or use of iterative reconstruction technique. COMPARISON:  None Available. FINDINGS: Segmentation: 5 lumbar type vertebrae. Alignment: Normal Vertebrae: No acute fracture or focal pathologic process. Paraspinal and other soft tissues: Negative Disc levels: Mild degenerative disc disease with early disc space narrowing and anterior spurring. Moderate degenerative facet disease in the mid and lower lumbar spine. No disc herniation. No central or neural foraminal narrowing. IMPRESSION: No acute bony abnormality.  Degenerative disc and facet disease. Electronically Signed   By: Charlett Nose M.D.   On: 03/28/2023 00:02   CT ABDOMEN PELVIS W CONTRAST  Result Date: 03/27/2023 CLINICAL DATA:  Left groin pain and lymphadenopathy. Evaluate for AVM or infection. EXAM: CT ABDOMEN AND PELVIS WITH CONTRAST TECHNIQUE: Multidetector CT imaging of the abdomen and pelvis was performed using the standard protocol following bolus administration of intravenous contrast. RADIATION DOSE REDUCTION: This exam was performed according to the departmental dose-optimization program which includes automated exposure control, adjustment of the mA and/or kV according to patient size and/or use of iterative reconstruction technique. CONTRAST:  75mL OMNIPAQUE IOHEXOL 350 MG/ML SOLN COMPARISON:  CT abdomen and pelvis 03/26/2023. FINDINGS: Lower chest: There is a new trace right pleural effusion Hepatobiliary: No focal liver abnormality is seen. Status post cholecystectomy. No biliary dilatation. Pancreas: Unremarkable. No pancreatic ductal dilatation or surrounding inflammatory changes. Spleen: Status post splenectomy. Adrenals/Urinary Tract: Adrenal glands are unremarkable. Kidneys are normal, without renal calculi, focal lesion, or hydronephrosis. Bladder is unremarkable. Stomach/Bowel:  Stomach is within normal limits. Appendix appears normal. No evidence of bowel wall thickening, distention, or inflammatory changes. There is diffuse colonic diverticulosis. Vascular/Lymphatic: No significant vascular findings are present. No enlarged abdominal or pelvic lymph nodes. Reproductive: Status post hysterectomy. No adnexal masses. Other: There is no ascites or free air. Central mesenteric haziness and small lymph nodes appear unchanged. There is a small fat containing umbilical hernia. Musculoskeletal: No fracture is seen. IMPRESSION: 1. No acute localizing process in the abdomen or pelvis. 2. New trace right pleural effusion. 3. Colonic diverticulosis. 4. Stable central mesenteric haziness and small lymph nodes, likely mesenteric panniculitis. Electronically Signed   By: Mcneil Sober.D.  On: 03/27/2023 23:42   CT ABDOMEN PELVIS WO CONTRAST  Result Date: 03/26/2023 CLINICAL DATA:  Left lower quadrant abdominal pain EXAM: CT ABDOMEN AND PELVIS WITHOUT CONTRAST TECHNIQUE: Multidetector CT imaging of the abdomen and pelvis was performed following the standard protocol without IV contrast. RADIATION DOSE REDUCTION: This exam was performed according to the departmental dose-optimization program which includes automated exposure control, adjustment of the mA and/or kV according to patient size and/or use of iterative reconstruction technique. COMPARISON:  CT abdomen and pelvis 01/31/2022 FINDINGS: Lower chest: There some patchy ground-glass opacities in the lung bases. Hepatobiliary: No focal liver abnormality is seen. Status post cholecystectomy. No biliary dilatation. Pancreas: Unremarkable. No pancreatic ductal dilatation or surrounding inflammatory changes. Spleen: Surgically absent, unchanged. Adrenals/Urinary Tract: Adrenal glands are unremarkable. Kidneys are normal, without renal calculi, focal lesion, or hydronephrosis. Bladder is unremarkable. Stomach/Bowel: Stomach is within normal limits.  Appendix appears normal. No evidence of bowel wall thickening, distention, or inflammatory changes. There is sigmoid colon diverticulosis. Vascular/Lymphatic: Aorta and IVC are normal in size. No enlarged lymph nodes are identified. There are scattered nonenlarged upper abdominal lymph nodes. There central mesenteric haziness with small lymph nodes as seen on the prior study. Reproductive: Status post hysterectomy. No adnexal masses. Other: There is a small fat containing umbilical hernia. There is no ascites. Musculoskeletal: No acute or significant osseous findings. IMPRESSION: 1. No acute localizing process in the abdomen or pelvis. 2. Sigmoid colon diverticulosis without evidence for diverticulitis. 3. Stable central mesenteric haziness with small lymph nodes. Findings can be seen in the setting of mesenteric panniculitis. 4. Patchy ground-glass opacities in the lung bases, likely infectious/inflammatory. Electronically Signed   By: Darliss Cheney M.D.   On: 03/26/2023 20:10   MM 3D DIAGNOSTIC MAMMOGRAM UNILATERAL LEFT BREAST  Result Date: 03/22/2023 CLINICAL DATA:  67 year old female presents for further evaluation of possible LEFT breast mass on screening mammogram. EXAM: DIGITAL DIAGNOSTIC UNILATERAL LEFT MAMMOGRAM WITH TOMOSYNTHESIS AND CAD; ULTRASOUND LEFT BREAST LIMITED TECHNIQUE: Left digital diagnostic mammography and breast tomosynthesis was performed. The images were evaluated with computer-aided detection. ; Targeted ultrasound examination of the left breast was performed. COMPARISON:  Previous exam(s). ACR Breast Density Category b: There are scattered areas of fibroglandular density. FINDINGS: Spot compression views of the LEFT breast demonstrate a persistent circumscribed oval low-density mass within the UPPER LEFT breast. Targeted ultrasound is performed, showing 2 benign cysts within the LEFT breast, 1 which corresponds to the screening study finding. These measure 0.9 x 0.5 x 0.7 cm at the 10  o'clock position 2 cm from the nipple and 1 x 0.6 x 0.8 cm at the 12 o'clock position 3 cm from the nipple. IMPRESSION: Benign LEFT breast cyst accounting for the screening study finding. RECOMMENDATION: Bilateral screening mammogram in 1 year. I have discussed the findings and recommendations with the patient. If applicable, a reminder letter will be sent to the patient regarding the next appointment. BI-RADS CATEGORY  2: Benign. Electronically Signed   By: Harmon Pier M.D.   On: 03/22/2023 11:50   [place] LIMITED ULTRASOUND INCLUDING AXILLA LEFT BREAST   Result Date: 03/22/2023 CLINICAL DATA:  67 year old female presents for further evaluation of possible LEFT breast mass on screening mammogram. EXAM: DIGITAL DIAGNOSTIC UNILATERAL LEFT MAMMOGRAM WITH TOMOSYNTHESIS AND CAD; ULTRASOUND LEFT BREAST LIMITED TECHNIQUE: Left digital diagnostic mammography and breast tomosynthesis was performed. The images were evaluated with computer-aided detection. ; Targeted ultrasound examination of the left breast was performed. COMPARISON:  Previous exam(s). ACR Breast Density  Category b: There are scattered areas of fibroglandular density. FINDINGS: Spot compression views of the LEFT breast demonstrate a persistent circumscribed oval low-density mass within the UPPER LEFT breast. Targeted ultrasound is performed, showing 2 benign cysts within the LEFT breast, 1 which corresponds to the screening study finding. These measure 0.9 x 0.5 x 0.7 cm at the 10 o'clock position 2 cm from the nipple and 1 x 0.6 x 0.8 cm at the 12 o'clock position 3 cm from the nipple. IMPRESSION: Benign LEFT breast cyst accounting for the screening study finding. RECOMMENDATION: Bilateral screening mammogram in 1 year. I have discussed the findings and recommendations with the patient. If applicable, a reminder letter will be sent to the patient regarding the next appointment. BI-RADS CATEGORY  2: Benign. Electronically Signed   By: Harmon Pier M.D.   On:  03/22/2023 11:50       The results of significant diagnostics from this hospitalization (including imaging, microbiology, ancillary and laboratory) are listed below for reference.     Microbiology: No results found for this or any previous visit (from the past 240 hour(s)).   Labs:  CBC: Recent Labs  Lab 03/26/23 1809 03/27/23 1950 03/29/23 1155 03/30/23 0421  WBC 8.9 8.9 8.0 6.3  NEUTROABS  --  4.4  --  5.5  HGB 13.4 14.0 14.3 14.1  HCT 40.3 44.1 44.6 42.7  MCV 94.2 94.6 93.9 92.2  PLT 321 338 341 314   BMP &GFR Recent Labs  Lab 03/26/23 1809 03/27/23 1950 03/29/23 1155 03/30/23 0421  NA 138 140 141 136  K 3.3* 3.9 3.5 4.0  CL 103 104 102 99  CO2 27 26 28 25   GLUCOSE 124* 123* 134* 184*  BUN 17 11 16 17   CREATININE 1.18* 1.00 1.12* 1.00  CALCIUM 9.0 9.5 9.7 9.2   Estimated Creatinine Clearance: 63.1 mL/min (by C-G formula based on SCr of 1 mg/dL). Liver & Pancreas: Recent Labs  Lab 03/26/23 1809 03/27/23 1950 03/29/23 1155 03/30/23 0421  AST 16 19 21 19   ALT 14 15 17 19   ALKPHOS 66 61 65 61  BILITOT 0.5 0.9 0.5 0.6  PROT 6.9 7.4 7.9 7.1  ALBUMIN 3.8 3.9 4.1 3.6   Recent Labs  Lab 03/26/23 1809 03/27/23 1950 03/29/23 1155  LIPASE 32 28 27   No results for input(s): "AMMONIA" in the last 168 hours. Diabetic: Recent Labs    03/30/23 0420  HGBA1C 6.9*   No results for input(s): "GLUCAP" in the last 168 hours. Cardiac Enzymes: No results for input(s): "CKTOTAL", "CKMB", "CKMBINDEX", "TROPONINI" in the last 168 hours. No results for input(s): "PROBNP" in the last 8760 hours. Coagulation Profile: No results for input(s): "INR", "PROTIME" in the last 168 hours. Thyroid Function Tests: No results for input(s): "TSH", "T4TOTAL", "FREET4", "T3FREE", "THYROIDAB" in the last 72 hours. Lipid Profile: No results for input(s): "CHOL", "HDL", "LDLCALC", "TRIG", "CHOLHDL", "LDLDIRECT" in the last 72 hours. Anemia Panel: No results for input(s):  "VITAMINB12", "FOLATE", "FERRITIN", "TIBC", "IRON", "RETICCTPCT" in the last 72 hours. Urine analysis:    Component Value Date/Time   COLORURINE COLORLESS (A) 03/29/2023 1142   APPEARANCEUR CLEAR 03/29/2023 1142   LABSPEC 1.004 (L) 03/29/2023 1142   PHURINE 6.0 03/29/2023 1142   GLUCOSEU NEGATIVE 03/29/2023 1142   HGBUR SMALL (A) 03/29/2023 1142   BILIRUBINUR NEGATIVE 03/29/2023 1142   KETONESUR NEGATIVE 03/29/2023 1142   PROTEINUR NEGATIVE 03/29/2023 1142   UROBILINOGEN 0.2 02/18/2012 1056   NITRITE NEGATIVE 03/29/2023 1142  LEUKOCYTESUR NEGATIVE 03/29/2023 1142   Sepsis Labs: Invalid input(s): "PROCALCITONIN", "LACTICIDVEN"   SIGNED:  Almon Hercules, MD  [facility] 03/30/2023, 2:47 PM

## 2023-04-02 NOTE — Plan of Care (Signed)
[facility] Tonsillectomy/Adenoidectomy, Postoperative PEDS care plan entered in error.

## 2023-06-27 ENCOUNTER — Other Ambulatory Visit: Payer: Self-pay | Admitting: Gastroenterology

## 2023-07-18 ENCOUNTER — Ambulatory Visit (HOSPITAL_COMMUNITY): Admit: 2023-07-18 | Payer: Medicare Other | Admitting: Gastroenterology

## 2023-07-18 ENCOUNTER — Encounter (HOSPITAL_COMMUNITY): Payer: Self-pay

## 2023-07-18 SURGERY — COLONOSCOPY WITH PROPOFOL
Anesthesia: Monitor Anesthesia Care

## 2023-07-25 ENCOUNTER — Other Ambulatory Visit: Payer: Self-pay | Admitting: Gastroenterology

## 2023-08-08 ENCOUNTER — Encounter (HOSPITAL_COMMUNITY): Payer: Self-pay | Admitting: Gastroenterology

## 2023-08-08 NOTE — Progress Notes (Signed)
 Attempted to obtain medical history for pre op call via telephone, unable to reach at this time. HIPAA compliant voicemail message left requesting return call to pre surgical testing department.

## 2023-08-14 NOTE — H&P (Signed)
 History of Present Illness This is a 68 year old female here today for followup for diverticular flare. Patient was treated with cipro/flagyl on 1/14. GI path panel negative. Today patient reports that she is feeling better. She is no longer having diarrhea. She has very occasional abdominal pain, very mild, nothing regular. Denies nausea, vomiting, or fevers.   Medical history includes hypertension, hyperlipidemia, reflux, anxiety, type 2 diabetes, diverticulosis, OSA. Echo 2017, EF 60-65%.   Notes from previous visit:  Today patient reports that she went out to lunch last Wednesday and then 2 hours after that she started having watery bowel movements. This lasted through Saturday. Symptoms did improve a little on Sunday, but patient started having some abdominal cramping. This pain feels like her diverticulitis pain. She continues to have diarrhea but stools are a little more solid now than they were. She started taking dicyclomine TID and this has not been helping with the pain. She denies fever. Has had some nausea, but  no vomiting. Denies blood in stools or black stools.   Patient presented to the [facility] health emergency room on 03/29/2023.  She had previously been seen outpatient and treated for diverticulitis flare with 10 days of Cipro and Flagyl.  Pain returned after stopping antibiotics and patient had been in ER twice in the previous 2 to 3 days. CT scan raise concerns for mesenteric pancolitis but no evidence of diverticulitis. Patient was given IV Dilaudid and discharged home on p.o. Percocet that seem to help with her pain. She returned to the ER emergency room for the third time in 4 days due to continued abdominal pain in the left lower quadrant radiating to the epigastric area and left thigh.  She reported urinary frequency also but denied dysuria and hematuria.  She had not been having any fever or chills. She not been having any diarrhea, melena, or hematochezia.  Patient's vitals remained  stable throughout ER visit.  CMP, CBC, lipase, and UA unrevealing. CRP and ESR were both within normal limits. CT angio of the abdomen and pelvis with and without contrast obtained, showed possible gastritis and stable haziness and small lymph nodes in the mesenteric root fat which could be seen with mesenteric adenitis, mesenteric pancolitis, and fibrosing mesenteritis although these findings were noted on CT of the abdomen and pelvis on 8/17.  CT also showed osteopenia and lumbar degenerative changes.  The following day the patient's abdominal pain improved.  Steroids appeared to help symptoms.  She was no longer having nausea.  She was discharged on a prednisone taper and gabapentin.  She was also told to continue Pepcid and PPI for gastritis.  GI History:  Colonoscopy: 05/17/22, due to abnormal abdominal CT scan, no adenomas, no microscopic colitis, repeat recommended in 10 years. Per Dr. Marca Ancona, needs repeat colonoscopy at [facility] due to tortuous colon.   Barium swallow with pill: 09/02/20, WNL  EGD: 2012, d/t epigastric abdominal pain, bile reflux  Alarm Symptoms: Anemia: Denies Unintentional Weight Loss: Denies Change in Appetite: Appetite decreased since the passing of her husband Change in Bowel Habits: Denies Trouble Swallowing: Denies Blood in Stool: Denies Black Stools: Denies  Abdominal Pain: Denies Diarrhea/Constipation: Constipated at times, takes miralax daily N/V: Denies Reflux: Yes, medication helps most of the time. Belching or abdominal bloating: Denies NSAID Use: Denies Blood Thinners: Denies Major medical events over past 6 months: Denies Cardiologist: Dr. Antoine Poche d/t diastolic heart failure  Family History of Colon Cancer: Denies.  Vital Signs Wt: 197.4, Wt change: 5 lbs,  Ht: 68.5, BMI: 29.57, Temp: 97.3, Pulse sitting: 62, BP sitting: 112/69.  Examination GENERAL APPEARANCE:  Well developed, well nourished, no active distress, pleasant, no acute distress .   ABDOMEN  Bowel sounds normal, Abdomen not distended, no abdominal tenderness noted.  PSYCHIATRIC  Alert and oriented x3, mood and affect appear normal..   Assessments 1. History of diverticulitis - Z87.19 (Primary)    Treatment 1. History of diverticulitis Colonoscopy (Ordered for 07/25/2023) Notes: Wynelle Cleveland 07/25/2023 08:35:35 AM > Pt scheduled for a colon on 09/05/2023 @ [facility] hospital with Dr. Marca Ancona. Pt consent form, instructions, and plenvu rx + coupon given to pt   No symptoms of diverticular flare noted today. Discussed risk of colonoscopy including perforation, bleeding, infection. All questions regarding procedure answered today. Order placed for colonoscopy to be completed at [facility] due to tortuous colon.

## 2023-08-15 ENCOUNTER — Encounter (HOSPITAL_COMMUNITY): Payer: Self-pay | Admitting: Gastroenterology

## 2023-08-15 ENCOUNTER — Encounter (HOSPITAL_COMMUNITY)
Admission: RE | Disposition: A | Discharge status: Home / Self Care | Payer: Self-pay | Source: Home / Self Care | Attending: Gastroenterology

## 2023-08-15 ENCOUNTER — Ambulatory Visit (HOSPITAL_COMMUNITY): Admitting: Anesthesiology

## 2023-08-15 ENCOUNTER — Ambulatory Visit (HOSPITAL_COMMUNITY)
Admission: RE | Admit: 2023-08-15 | Discharge: 2023-08-15 | Disposition: A | Discharge status: Home / Self Care | Payer: Medicare Other | Attending: Gastroenterology | Admitting: Gastroenterology

## 2023-08-15 ENCOUNTER — Other Ambulatory Visit: Payer: Self-pay

## 2023-08-15 DIAGNOSIS — K648 Other hemorrhoids: Secondary | ICD-10-CM | POA: Insufficient documentation

## 2023-08-15 DIAGNOSIS — K5730 Diverticulosis of large intestine without perforation or abscess without bleeding: Secondary | ICD-10-CM | POA: Diagnosis not present

## 2023-08-15 DIAGNOSIS — M8580 Other specified disorders of bone density and structure, unspecified site: Secondary | ICD-10-CM | POA: Diagnosis not present

## 2023-08-15 DIAGNOSIS — Z79899 Other long term (current) drug therapy: Secondary | ICD-10-CM | POA: Insufficient documentation

## 2023-08-15 DIAGNOSIS — K219 Gastro-esophageal reflux disease without esophagitis: Secondary | ICD-10-CM | POA: Diagnosis not present

## 2023-08-15 DIAGNOSIS — G4733 Obstructive sleep apnea (adult) (pediatric): Secondary | ICD-10-CM | POA: Diagnosis not present

## 2023-08-15 DIAGNOSIS — K635 Polyp of colon: Secondary | ICD-10-CM

## 2023-08-15 DIAGNOSIS — D122 Benign neoplasm of ascending colon: Secondary | ICD-10-CM | POA: Diagnosis not present

## 2023-08-15 DIAGNOSIS — Z09 Encounter for follow-up examination after completed treatment for conditions other than malignant neoplasm: Secondary | ICD-10-CM | POA: Diagnosis present

## 2023-08-15 DIAGNOSIS — I5032 Chronic diastolic (congestive) heart failure: Secondary | ICD-10-CM | POA: Diagnosis not present

## 2023-08-15 DIAGNOSIS — E785 Hyperlipidemia, unspecified: Secondary | ICD-10-CM | POA: Diagnosis not present

## 2023-08-15 DIAGNOSIS — R933 Abnormal findings on diagnostic imaging of other parts of digestive tract: Secondary | ICD-10-CM | POA: Insufficient documentation

## 2023-08-15 DIAGNOSIS — E119 Type 2 diabetes mellitus without complications: Secondary | ICD-10-CM | POA: Insufficient documentation

## 2023-08-15 DIAGNOSIS — K644 Residual hemorrhoidal skin tags: Secondary | ICD-10-CM | POA: Diagnosis not present

## 2023-08-15 DIAGNOSIS — K5900 Constipation, unspecified: Secondary | ICD-10-CM | POA: Insufficient documentation

## 2023-08-15 DIAGNOSIS — I110 Hypertensive heart disease with heart failure: Secondary | ICD-10-CM | POA: Insufficient documentation

## 2023-08-15 HISTORY — PX: POLYPECTOMY: SHX5525

## 2023-08-15 HISTORY — PX: COLONOSCOPY WITH PROPOFOL: SHX5780

## 2023-08-15 SURGERY — COLONOSCOPY WITH PROPOFOL
Anesthesia: Monitor Anesthesia Care

## 2023-08-15 MED ORDER — PROPOFOL 10 MG/ML IV BOLUS
INTRAVENOUS | Status: DC | PRN
  Administered 2023-08-15 (×2): 90 mg via INTRAVENOUS
  Administered 2023-08-15: 40 mg via INTRAVENOUS
  Administered 2023-08-15: 20 mg via INTRAVENOUS

## 2023-08-15 MED ORDER — LIDOCAINE 2% (20 MG/ML) 5 ML SYRINGE
INTRAMUSCULAR | Status: DC | PRN
  Administered 2023-08-15: 40 mg via INTRAVENOUS

## 2023-08-15 MED ORDER — PROPOFOL 500 MG/50ML IV EMUL
INTRAVENOUS | Status: AC
  Filled 2023-08-15: qty 50

## 2023-08-15 MED ORDER — SODIUM CHLORIDE 0.9% FLUSH: 3.0000 mL | Freq: Two times a day (BID) | INTRAVENOUS | Status: DC

## 2023-08-15 MED ORDER — SODIUM CHLORIDE 0.9% FLUSH: 3.0000 mL | INTRAVENOUS | Status: DC | PRN

## 2023-08-15 MED ORDER — SODIUM CHLORIDE 0.9 % IV SOLN: INTRAVENOUS | Status: DC | PRN

## 2023-08-15 MED ORDER — GLYCOPYRROLATE PF 0.2 MG/ML IJ SOSY
PREFILLED_SYRINGE | INTRAMUSCULAR | Status: DC | PRN
  Administered 2023-08-15: .1 mg via INTRAVENOUS

## 2023-08-15 SURGICAL SUPPLY — 20 items

## 2023-08-15 NOTE — Op Note (Signed)
 [facility] ([facility]) Patient Name: [name] Procedure Date: 08/15/2023 MRN: [id number] Attending MD: Kerin Salen , MD, [id number] Date of Birth: Aug 21, 1955 CSN: [id number] Age: 68 Admit Type: Outpatient Procedure:                Colonoscopy Indications:              Last colonoscopy: December 2023, Abnormal CT of the                            GI tract, follow up of diverticulitis Providers:                Kerin Salen, MD, Martha Clan, RN, Stephens Shire                            RN, RN, Rozetta Nunnery, Technician Referring MD:             Joylene Draft Avva,MD Medicines:                Monitored Anesthesia Care Complications:            No immediate complications. Estimated blood loss:                            Minimal. Estimated Blood Loss:     Estimated blood loss was minimal. Procedure:                Pre-Anesthesia Assessment:                           - Prior to the procedure, a History and Physical                            was performed, and patient medications and                            allergies were reviewed. The patient's tolerance of                            previous anesthesia was also reviewed. The risks                            and benefits of the procedure and the sedation                            options and risks were discussed with the patient.                            All questions were answered, and informed consent                            was obtained. Prior Anticoagulants: The patient has                            taken no anticoagulant or antiplatelet agents. ASA  Grade Assessment: III - A patient with severe                            systemic disease. After reviewing the risks and                            benefits, the patient was deemed in satisfactory                            condition to undergo the procedure.                           After obtaining informed consent, the colonoscope                             was passed under direct vision. Throughout the                            procedure, the patient's blood pressure, pulse, and                            oxygen saturations were monitored continuously. The                            PCF-HQ190L ([id number]) Olympus colonoscope was                            introduced through the anus and advanced to the the                            terminal ileum. The colonoscopy was performed                            without difficulty. The patient tolerated the                            procedure well. The quality of the bowel                            preparation was good. The terminal ileum, ileocecal                            valve, appendiceal orifice, and rectum were                            photographed. Scope In: 10:57:05 AM Scope Out: 11:14:56 AM Scope Withdrawal Time: 0 hours 11 minutes 58 seconds  Total Procedure Duration: 0 hours 17 minutes 51 seconds  Findings:      Skin tags were found on perianal exam.      Hemorrhoids were found on perianal exam.      The terminal ileum appeared normal.      Multiple large-mouthed and medium-mouthed diverticula were found in the       recto-sigmoid colon, sigmoid colon, descending colon, transverse colon  and hepatic flexure.      A 5 mm polyp was found in the ascending colon. The polyp was sessile.       The polyp was removed with a cold snare. Resection and retrieval were       complete.      A 4 mm polyp was found in the sigmoid colon. The polyp was sessile. The       polyp was removed with a piecemeal technique using a cold biopsy       forceps. Resection and retrieval were complete.      Non-bleeding internal hemorrhoids were found during retroflexion. Impression:               - Perianal skin tags found on perianal exam.                           - Hemorrhoids found on perianal exam.                           - The examined portion of the ileum was normal.                            - Diverticulosis in the recto-sigmoid colon, in the                            sigmoid colon, in the descending colon, in the                            transverse colon and at the hepatic flexure.                           - One 5 mm polyp in the ascending colon, removed                            with a cold snare. Resected and retrieved.                           - One 4 mm polyp in the sigmoid colon, removed                            piecemeal using a cold biopsy forceps. Resected and                            retrieved.                           - Non-bleeding internal hemorrhoids. Moderate Sedation:      Patient did not receive moderate sedation for this procedure, but       instead received monitored anesthesia care. Recommendation:           - Patient has a contact number available for                            emergencies. The signs and symptoms of potential  delayed complications were discussed with the                            patient. Return to normal activities tomorrow.                            Written discharge instructions were provided to the                            patient.                           - High fiber diet.                           - Continue present medications.                           - Await pathology results.                           - Repeat colonoscopy for surveillance based on                            pathology results. Procedure Code(s):        --- Professional ---                           [phone], Colonoscopy, flexible; with removal of                            tumor(s), polyp(s), or other lesion(s) by snare                            technique                           45380, 59, Colonoscopy, flexible; with biopsy,                            single or multiple Diagnosis Code(s):        --- Professional ---                           K64.8, Other hemorrhoids                           D12.2,  Benign neoplasm of ascending colon                           D12.5, Benign neoplasm of sigmoid colon                           K64.4, Residual hemorrhoidal skin tags                           K57.30, Diverticulosis of large intestine without  perforation or abscess without bleeding                           R93.3, Abnormal findings on diagnostic imaging of                            other parts of digestive tract CPT copyright 2022 American Medical Association. All rights reserved. The codes documented in this report are preliminary and upon coder review may  be revised to meet current compliance requirements. Kerin Salen, MD 08/15/2023 11:23:10 AM This report has been signed electronically. Number of Addenda: 0

## 2023-08-15 NOTE — Anesthesia Preprocedure Evaluation (Addendum)
 Anesthesia Evaluation  Patient identified by MRN, date of birth, ID band Patient awake    Reviewed: Allergy & Precautions, H&P , NPO status , Patient's Chart, lab work & pertinent test results  History of Anesthesia Complications (+) DIFFICULT AIRWAY and history of anesthetic complications  Airway Mallampati: IV  TM Distance: <3 FB Neck ROM: Full   Comment: ANTERIOR  Dental no notable dental hx.    Pulmonary sleep apnea and Continuous Positive Airway Pressure Ventilation    Pulmonary exam normal breath sounds clear to auscultation       Cardiovascular hypertension, Normal cardiovascular exam Rhythm:Regular Rate:Normal     Neuro/Psych  Headaches  negative psych ROS   GI/Hepatic Neg liver ROS,GERD  ,,  Endo/Other  diabetes    Renal/GU negative Renal ROS  negative genitourinary   Musculoskeletal negative musculoskeletal ROS (+)    Abdominal   Peds negative pediatric ROS (+)  Hematology negative hematology ROS (+)   Anesthesia Other Findings   Reproductive/Obstetrics negative OB ROS                             Anesthesia Physical Anesthesia Plan  ASA: 3  Anesthesia Plan: MAC   Post-op Pain Management: Minimal or no pain anticipated   Induction: Intravenous  PONV Risk Score and Plan: 2 and Propofol infusion and Treatment may vary due to age or medical condition  Airway Management Planned: Simple Face Mask  Additional Equipment:   Intra-op Plan:   Post-operative Plan:   Informed Consent: I have reviewed the patients History and Physical, chart, labs and discussed the procedure including the risks, benefits and alternatives for the proposed anesthesia with the patient or authorized representative who has indicated his/her understanding and acceptance.     Dental advisory given  Plan Discussed with: CRNA and Surgeon  Anesthesia Plan Comments:        Anesthesia Quick  Evaluation

## 2023-08-15 NOTE — Transfer of Care (Signed)
 Immediate Anesthesia Transfer of Care Note  Patient: [name]  Procedure(s) Performed: COLONOSCOPY WITH PROPOFOL POLYPECTOMY  Patient Location: PACU and Endoscopy Unit  Anesthesia Type:MAC  Level of Consciousness: awake, alert , oriented, and patient cooperative  Airway & Oxygen Therapy: Patient Spontanous Breathing and Patient connected to face mask oxygen  Post-op Assessment: Report given to RN and Post -op Vital signs reviewed and stable  Post vital signs: Reviewed and stable  Last Vitals:  Vitals Value Taken Time  BP    Temp 36.5 C 08/15/23 1121  Pulse 71 08/15/23 1123  Resp 17 08/15/23 1123  SpO2 99 % 08/15/23 1123  Vitals shown include unfiled device data.  Last Pain:  Vitals:   08/15/23 1121  TempSrc: Temporal  PainSc: 0-No pain         Complications: No notable events documented.

## 2023-08-15 NOTE — Anesthesia Postprocedure Evaluation (Signed)
 Anesthesia Post Note  Patient: [name]  Procedure(s) Performed: COLONOSCOPY WITH PROPOFOL POLYPECTOMY     Anesthesia Type: MAC Anesthetic complications: no   No notable events documented.  Last Vitals:  Vitals:   08/15/23 1130 08/15/23 1141  BP: 113/60 118/72  Pulse: 68 73  Resp: 14 14  Temp:    SpO2: 97% 99%    Last Pain:  Vitals:   08/15/23 1141  TempSrc:   PainSc: 0-No pain                 Myrical Andujo

## 2023-08-15 NOTE — Discharge Instructions (Signed)

## 2023-08-15 NOTE — Anesthesia Postprocedure Evaluation (Signed)
 Anesthesia Post Note  Patient: [name]  Procedure(s) Performed: COLONOSCOPY WITH PROPOFOL POLYPECTOMY     Patient location during evaluation: PACU Anesthesia Type: MAC Level of consciousness: awake and alert Pain management: pain level controlled Vital Signs Assessment: post-procedure vital signs reviewed and stable Respiratory status: spontaneous breathing, nonlabored ventilation, respiratory function stable and patient connected to nasal cannula oxygen Cardiovascular status: stable and blood pressure returned to baseline Postop Assessment: no apparent nausea or vomiting Anesthetic complications: no   No notable events documented.  Last Vitals:  Vitals:   08/15/23 1130 08/15/23 1141  BP: 113/60 118/72  Pulse: 68 73  Resp: 14 14  Temp:    SpO2: 97% 99%    Last Pain:  Vitals:   08/15/23 1141  TempSrc:   PainSc: 0-No pain                 Mataya Kilduff

## 2023-08-15 NOTE — Interval H&P Note (Signed)
 History and Physical Interval Note: 68 year old female for colonoscopy after recurrent diverticulitis treated with antibiotics, possible mesenteric pancolitis.  08/15/2023 10:13 AM  [name]  has presented today for colonoscopy with propofol, with the diagnosis of History of diverticulitis.  The various methods of treatment have been discussed with the patient and family. After consideration of risks, benefits and other options for treatment, the patient has consented to  Procedure(s): COLONOSCOPY WITH PROPOFOL (N/A) as a surgical intervention.  The patient's history has been reviewed, patient examined, no change in status, stable for surgery.  I have reviewed the patient's chart and labs.  Questions were answered to the patient's satisfaction.     Kerin Salen

## 2023-08-16 ENCOUNTER — Other Ambulatory Visit: Payer: Self-pay | Admitting: Neurology

## 2023-08-16 LAB — SURGICAL PATHOLOGY

## 2023-08-17 ENCOUNTER — Other Ambulatory Visit: Payer: Self-pay | Admitting: Neurology

## 2023-08-17 ENCOUNTER — Encounter: Payer: Self-pay | Admitting: Neurology

## 2023-08-17 ENCOUNTER — Encounter (HOSPITAL_COMMUNITY): Payer: Self-pay | Admitting: Gastroenterology

## 2023-08-17 NOTE — Telephone Encounter (Signed)
 Last seen on 02/07/23 Follow up scheduled on 02/07/24 Last filled on 07/05/23 #60 tablets (30 day supply) Rx pending to be signed

## 2023-08-18 MED ORDER — CLONAZEPAM 0.5 MG PO TABS: 0.7500 mg | ORAL_TABLET | Freq: Every day | ORAL | 5 refills | Status: DC

## 2023-08-18 NOTE — Addendum Note (Signed)
 Addended by: Melvyn Novas on: 08/18/2023 03:55 PM   Modules accepted: Orders

## 2023-08-19 ENCOUNTER — Other Ambulatory Visit: Payer: Self-pay | Admitting: Cardiology

## 2023-08-21 NOTE — Telephone Encounter (Addendum)
 Pt said [facility] [address], is out of stock of Clonazepam. She is asking Rx to be now be sent to [facility] [place] this one time. Rx pending to be signed.

## 2023-08-21 NOTE — Addendum Note (Signed)
 Addended by: Aura Camps on: 08/21/2023 07:34 AM   Modules accepted: Orders

## 2023-08-23 MED ORDER — CLONAZEPAM 0.5 MG PO TABS: ORAL_TABLET | ORAL | 0 refills | Status: DC

## 2023-09-14 ENCOUNTER — Other Ambulatory Visit: Payer: Self-pay | Admitting: Cardiology

## 2023-09-14 MED ORDER — METOPROLOL TARTRATE 25 MG PO TABS: 25.0000 mg | ORAL_TABLET | Freq: Two times a day (BID) | ORAL | 0 refills | Status: DC

## 2023-09-14 NOTE — Addendum Note (Signed)
 Addended by: Adriana Simas, Chablis Losh L on: 09/14/2023 01:19 PM   Modules accepted: Orders

## 2023-09-25 ENCOUNTER — Other Ambulatory Visit: Payer: Self-pay | Admitting: Cardiology

## 2023-10-17 ENCOUNTER — Other Ambulatory Visit: Payer: Self-pay | Admitting: Cardiology

## 2023-11-01 NOTE — Progress Notes (Signed)
  Cardiology Office Note:   Date:  11/03/2023  ID:  [name], DOB 1955-07-31, MRN [id number] PCP: Lonzie Robins, MD  [facility] Providers Cardiologist:  Eilleen Grates, MD {  History of Present Illness:   [name] is a 68 y.o. female who was referred by Lonzie Robins, MD for evaluation of palpitations and arm pain.   I saw her in March 2015 when she was briefly hospitalized  For evaluation of chest pain. This was atypical. Follow-up stress testing was unremarkable for any evidence of ischemia.   I saw her in 2016 preop prior to tonsillectomy.  She also had an echocardiogram in 2017 and there were no significant abnormalities.   She had a negative POET (Plain Old Exercise Treadmill) in 2024.    She has had a significant amount of stress.  Her husband died of renal failure.  She has a terminally ill daughter.  Her 67 year old granddaughter and 64-month-old great granddaughter are living with her.  She has just become the executor of her aunt's estate.  She is inherited a child while.  She works full-time.  The patient denies any new symptoms such as chest discomfort, neck or arm discomfort. There has been no new shortness of breath, PND or orthopnea. There have been no reported palpitations, presyncope or syncope.  She does have some chronic palpitations and occasionally feels these.   ROS: As stated in the HPI and negative for all other systems.  Studies Reviewed:    EKG:   Sinus rhythm rate 65, borderline first-degree AV block, poor anterior R wave progression, premature ventricular contractions, no acute ST-T wave changes, 03/29/2023    Risk Assessment/Calculations:              Physical Exam:   VS:  BP 124/68   Pulse 68   Ht 5\' 9"  (1.753 m)   Wt 191 lb (86.6 kg)   SpO2 97%   BMI 28.21 kg/m    Wt Readings from Last 3 Encounters:  11/02/23 191 lb (86.6 kg)  08/15/23 189 lb 6.4 oz (85.9 kg)  03/29/23 203 lb 14.8 oz (92.5 kg)     GEN: Well nourished,  well developed in no acute distress NECK: No JVD; No carotid bruits CARDIAC: RRR, no murmurs, rubs, gallops RESPIRATORY:  Clear to auscultation without rales, wheezing or rhonchi  ABDOMEN: Soft, non-tender, non-distended EXTREMITIES:  No edema; No deformity   ASSESSMENT AND PLAN:   PALPITATIONS: The patient's palpitations are unchanged.  She will continue with metoprolol  and could take as needed dosing as well and we discussed this.   HTN:   BP is running at target.  No change in therapy.  EDEMA:   She did have heart failure in 2017 with a preserved ejection fraction.  She has no new dyspnea.  Her extremity swelling is unchanged.  She takes Lasix  and we talked about the compression socks that she wears sometimes.  She will continue with diuretics and could take as needed.  She avoids salt.  No change in therapy.      Follow up with me in 18 months.  Signed, Eilleen Grates, MD

## 2023-11-02 ENCOUNTER — Ambulatory Visit: Attending: Cardiology | Admitting: Cardiology

## 2023-11-02 ENCOUNTER — Encounter: Payer: Self-pay | Admitting: Cardiology

## 2023-11-02 VITALS — BP 124/68 | HR 68 | Ht 69.0 in | Wt 191.0 lb

## 2023-11-02 DIAGNOSIS — R609 Edema, unspecified: Secondary | ICD-10-CM | POA: Diagnosis not present

## 2023-11-02 DIAGNOSIS — R0789 Other chest pain: Secondary | ICD-10-CM | POA: Diagnosis not present

## 2023-11-02 DIAGNOSIS — R002 Palpitations: Secondary | ICD-10-CM | POA: Diagnosis not present

## 2023-11-02 DIAGNOSIS — I10 Essential (primary) hypertension: Secondary | ICD-10-CM | POA: Diagnosis not present

## 2023-11-02 NOTE — Patient Instructions (Signed)
 Medication Instructions:  Your physician recommends that you continue on your current medications as directed. Please refer to the Current Medication list given to you today.  *If you need a refill on your cardiac medications before your next appointment, please call your pharmacy*  Follow-Up: At [facility], you and your health needs are our priority.  As part of our continuing mission to provide you with exceptional heart care, our providers are all part of one team.  This team includes your primary Cardiologist (physician) and Advanced Practice Providers or APPs (Physician Assistants and Nurse Practitioners) who all work together to provide you with the care you need, when you need it.  Your next appointment:   18 month(s)  Provider:   Eilleen Grates, MD   We recommend signing up for the patient portal called "MyChart".  Sign up information is provided on this After Visit Summary.  MyChart is used to connect with patients for Virtual Visits (Telemedicine).  Patients are able to view lab/test results, encounter notes, upcoming appointments, etc.  Non-urgent messages can be sent to your provider as well.   To learn more about what you can do with MyChart, go to [email or web address].

## 2023-11-03 ENCOUNTER — Encounter: Payer: Self-pay | Admitting: Cardiology

## 2024-01-01 ENCOUNTER — Other Ambulatory Visit: Payer: Self-pay | Admitting: Internal Medicine

## 2024-01-01 DIAGNOSIS — Z1231 Encounter for screening mammogram for malignant neoplasm of breast: Secondary | ICD-10-CM

## 2024-01-13 ENCOUNTER — Other Ambulatory Visit: Payer: Self-pay | Admitting: Cardiology

## 2024-02-01 ENCOUNTER — Ambulatory Visit
Admission: RE | Admit: 2024-02-01 | Discharge: 2024-02-01 | Disposition: A | Discharge status: Home / Self Care | Source: Ambulatory Visit | Attending: Internal Medicine | Admitting: Internal Medicine

## 2024-02-01 DIAGNOSIS — Z1231 Encounter for screening mammogram for malignant neoplasm of breast: Secondary | ICD-10-CM

## 2024-02-06 NOTE — Progress Notes (Deleted)
 Patient: [name] Date of Birth: Jan 21, 1956  Reason for Visit: Follow up History from: Patient Primary Neurologist: Dohemier    ASSESSMENT AND PLAN 68 y.o. year old female   REM sleep behavior disorder    HISTORY OF PRESENT ILLNESS: Today 02/06/24 Last visit with Dr. Chalice, very good compliance with Luna CPAP, added ONO.  HISTORY  [name] is a 68 y.o. female patient who is here for revisit 02/07/2023 for a Rv .  I had the pleasure of meeting [name] for the first time on February 12 of this year when she presented with a longstanding history of sleep behavior disorder treated under clonazepam .  This is still her medication at this time she is also on a Ventolin  inhaler she takes occasionally Tylenol  Restasis eyedrops are still on her list diltiazem , other nova, Pepcid , Flonase, Advair as needed, Lasix  40 mg daily, Robaxin  as needed, metoprolol  twice a day 25 mg, Singulair , Protonix , Altace , Crestor , Florastor.  Klonopin  is 0.5 mg 2 tablets by mouth at bedtime    She also brought her CPAP to us  today and her compliance with CPAP is excellent she has used the machine 27 out of 30 days and 26 of these days over 4 hours consecutively.  The average use of time is 7 hours 22 minutes.  Her maximum pressure is 10 minimum pressure of 5 cm water with 2 cm EPR.  She does not have high air leakage the 95th percentile pressure is 9.9 cm water and the average AHI is at residual 3.7/h.  I would like to increase the maximum pressure by 2 cm to reduce the residual apnea index for obstructive events.     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    After 3 months he returned home and she noted she had no REM BD events in the 3 months of his absence. He is changing to home dialysis.   REVIEW OF SYSTEMS: Out of a complete 14 system review of symptoms, the patient complains only of the following  symptoms, and all other reviewed systems are negative.  See HPI  ALLERGIES: Allergies  Allergen Reactions   Aspirin Anaphylaxis   Bee Venom Anaphylaxis   Demerol [Meperidine] Anaphylaxis   Ibuprofen Anaphylaxis   Nsaids Anaphylaxis   Iohexol  Other (See Comments) and Hypertension    ELEVATION OF B.P, PASSED OUT, PT. NEEDS PRE-MEDS FOR IODINE CONTRAST    Amitriptyline      Mental status changes   Clindamycin/Lincomycin     Thrush    Codeine Nausea And Vomiting   Ivp Dye [Iodinated Contrast Media] Hypertension    Flushing, also   Mupirocin Other (See Comments)    Makes more inflamed   Neosporin [Neomycin-Bacitracin Zn-Polymyx] Other (See Comments)    Makes more inflammed   Pollen Extract Other (See Comments)    Stuffy nose   Sulfonamide Derivatives Hives   Tramadol Nausea And Vomiting   Cephalosporins Swelling   Nystatin Hives    Facial swelling   Penicillins Swelling    Has patient had a PCN reaction causing immediate rash, facial/tongue/throat swelling, SOB or lightheadedness with hypotension: Yes Has patient had a PCN reaction causing severe rash involving mucus membranes or skin necrosis: No Has patient had a PCN reaction that required hospitalization: No Has patient had a PCN reaction occurring within the last 10 years: No If all of the  above answers are NO, then may proceed with Cephalosporin use.     HOME MEDICATIONS: Outpatient Medications Prior to Visit  Medication Sig Dispense Refill   acetaminophen  (TYLENOL ) 500 MG tablet Take 1,000 mg by mouth daily as needed (for pain).     albuterol  (VENTOLIN  HFA) 108 (90 Base) MCG/ACT inhaler Inhale 2 puffs into the lungs every 6 (six) hours as needed for wheezing or shortness of breath.      clonazePAM  (KLONOPIN ) 0.5 MG tablet Take 1.5 tablets (0.75 mg total) by mouth at bedtime. Take 1.5 tablets by mouth for sleep REM BD 45 tablet 0   diltiazem  (DILACOR XR ) 180 MG 24 hr capsule Take 180 mg by mouth daily.      EPINEPHrine 0.3 mg/0.3 mL IJ SOAJ injection Inject 0.3 mLs as directed as needed (for an anaphylactic reaction).      Eyelid Cleansers (AVENOVA) 0.01 % SOLN Place 1 application into the right eye daily.      famotidine  (PEPCID ) 40 MG tablet Take 40 mg by mouth daily.     fluticasone (FLONASE) 50 MCG/ACT nasal spray Place 1 spray into both nostrils daily as needed for allergies or rhinitis.      furosemide  (LASIX ) 40 MG tablet Take 40 mg by mouth daily.      methocarbamol  (ROBAXIN ) 750 MG tablet Take 1 tablet (750 mg total) by mouth 3 (three) times daily as needed (muscle spasm/pain). 15 tablet 0   metoprolol  tartrate (LOPRESSOR ) 25 MG tablet TAKE 1 TABLET BY MOUTH TWICE A DAY 180 tablet 3   montelukast  (SINGULAIR ) 10 MG tablet Take 10 mg by mouth at bedtime.      Multiple Vitamin (MULTIVITAMIN WITH MINERALS) TABS tablet Take 1 tablet by mouth daily.     pantoprazole  (PROTONIX ) 40 MG tablet Take 40 mg by mouth daily.     ramipril  (ALTACE ) 5 MG capsule Take 1 capsule (5 mg total) by mouth daily. 90 capsule 3   rosuvastatin  (CRESTOR ) 20 MG tablet Take 10 mg by mouth daily.     saccharomyces boulardii (FLORASTOR) 250 MG capsule Take 250 mg by mouth daily.     senna-docusate (SENOKOT-S) 8.6-50 MG tablet Take 2 tablets by mouth 2 (two) times daily between meals as needed for mild constipation.     WIXELA INHUB 250-50 MCG/ACT AEPB Inhale 1 puff into the lungs daily as needed (for shortness of breath).     XIIDRA 5 % SOLN Apply 1 drop to eye 2 (two) times daily.     No facility-administered medications prior to visit.    PAST MEDICAL HISTORY: Past Medical History:  Diagnosis Date   Atrial fibrillation (HCC)    Complication of anesthesia     I have to be awake to be intubated because I have a mass in my throut    Diabetes mellitus    Difficult intubation    mass in throut   Diverticulitis    Hyperlipidemia    Hypertension    Migraines 2013   Sigmoid diverticulitis 03/20/2016   Sleep apnea     CPAP    PAST SURGICAL HISTORY: Past Surgical History:  Procedure Laterality Date   ABDOMINAL HYSTERECTOMY     BIOPSY  05/17/2022   Procedure: BIOPSY;  Surgeon: Saintclair Jasper, MD;  Location: [facility];  Service: Gastroenterology;;   BREAST CYST ASPIRATION Left 09/2016   COLONOSCOPY WITH PROPOFOL  N/A 05/17/2022   Procedure: COLONOSCOPY WITH PROPOFOL ;  Surgeon: Saintclair Jasper, MD;  Location: [facility];  Service: Gastroenterology;  Laterality:  N/A;   COLONOSCOPY WITH PROPOFOL  N/A 08/15/2023   Procedure: COLONOSCOPY WITH PROPOFOL ;  Surgeon: Saintclair Jasper, MD;  Location: [facility];  Service: Gastroenterology;  Laterality: N/A;   GALLBLADDER SURGERY     KNEE ARTHROSCOPY     lymph nodectomy     NASAL SINUS SURGERY     POLYPECTOMY  05/17/2022   Procedure: POLYPECTOMY;  Surgeon: Saintclair Jasper, MD;  Location: [facility];  Service: Gastroenterology;;   POLYPECTOMY  08/15/2023   Procedure: POLYPECTOMY;  Surgeon: Saintclair Jasper, MD;  Location: [facility];  Service: Gastroenterology;;   SPLENIC ARTERY EMBOLIZATION     due to Aneurysm   TONSILLECTOMY      FAMILY HISTORY: Family History  Problem Relation Age of Onset   Diabetes Father    Hypertension Father    CAD Daughter 33       CABG X 3    SOCIAL HISTORY: Social History   Socioeconomic History   Marital status: Widowed    Spouse name: Not on file   Number of children: Not on file   Years of education: Not on file   Highest education level: Not on file  Occupational History   Not on file  Tobacco Use   Smoking status: Never   Smokeless tobacco: Never  Vaping Use   Vaping status: Never Used  Substance and Sexual Activity   Alcohol  use: No   Drug use: No   Sexual activity: Yes    Birth control/protection: Surgical    Comment: hysterectomy  Other Topics Concern   Not on file  Social History Narrative   Lives with granddaughter.    Social Drivers of Corporate investment banker Strain: Not on file  Food Insecurity: No  Food Insecurity (03/29/2023)   Hunger Vital Sign    Worried About Running Out of Food in the Last Year: Never true    Ran Out of Food in the Last Year: Never true  Transportation Needs: No Transportation Needs (03/29/2023)   PRAPARE - Administrator, Civil Service (Medical): No    Lack of Transportation (Non-Medical): No  Physical Activity: Not on file  Stress: Not on file  Social Connections: Unknown (10/24/2021)   Received from [facility]   Social Network    Social Network: Not on file  Intimate Partner Violence: Not At Risk (03/29/2023)   Humiliation, Afraid, Rape, and Kick questionnaire    Fear of Current or Ex-Partner: No    Emotionally Abused: No    Physically Abused: No    Sexually Abused: No    PHYSICAL EXAM  There were no vitals filed for this visit. There is no height or weight on file to calculate BMI.  Generalized: Well developed, in no acute distress  Neurological examination  Mentation: Alert oriented to time, place, history taking. Follows all commands speech and language fluent Cranial nerve II-XII: Pupils were equal round reactive to light. Extraocular movements were full, visual field were full on confrontational test. Facial sensation and strength were normal. Uvula tongue midline. Head turning and shoulder shrug  were normal and symmetric. Motor: The motor testing reveals 5 over 5 strength of all 4 extremities. Good symmetric motor tone is noted throughout.  Sensory: Sensory testing is intact to soft touch on all 4 extremities. No evidence of extinction is noted.  Coordination: Cerebellar testing reveals good finger-nose-finger and heel-to-shin bilaterally.  Gait and station: Gait is normal. Tandem gait is normal. Romberg is negative. No drift is seen.  Reflexes: Deep  tendon reflexes are symmetric and normal bilaterally.   DIAGNOSTIC DATA (LABS, IMAGING, TESTING) - I reviewed patient records, labs, notes, testing and imaging myself where  available.  Lab Results  Component Value Date   WBC 6.3 03/30/2023   HGB 14.1 03/30/2023   HCT 42.7 03/30/2023   MCV 92.2 03/30/2023   PLT 314 03/30/2023      Component Value Date/Time   NA 136 03/30/2023 0421   NA 142 11/22/2018 1232   K 4.0 03/30/2023 0421   CL 99 03/30/2023 0421   CO2 25 03/30/2023 0421   GLUCOSE 184 (H) 03/30/2023 0421   BUN 17 03/30/2023 0421   BUN 10 11/22/2018 1232   CREATININE 1.00 03/30/2023 0421   CALCIUM  9.2 03/30/2023 0421   PROT 7.1 03/30/2023 0421   ALBUMIN 3.6 03/30/2023 0421   AST 19 03/30/2023 0421   ALT 19 03/30/2023 0421   ALKPHOS 61 03/30/2023 0421   BILITOT 0.6 03/30/2023 0421   GFRNONAA >60 03/30/2023 0421   GFRAA 56 (L) 11/22/2018 1232   Lab Results  Component Value Date   CHOL  07/20/2009    170        ATP III CLASSIFICATION:  <200     mg/dL   Desirable  799-760  mg/dL   Borderline High  >=759    mg/dL   High          HDL 43 07/20/2009   LDLCALC  07/20/2009    93        Total Cholesterol/HDL:CHD Risk Coronary Heart Disease Risk Table                     Men   Women  1/2 Average Risk   3.4   3.3  Average Risk       5.0   4.4  2 X Average Risk   9.6   7.1  3 X Average Risk  23.4   11.0        Use the calculated Patient Ratio above and the CHD Risk Table to determine the patient's CHD Risk.        ATP III CLASSIFICATION (LDL):  <100     mg/dL   Optimal  899-870  mg/dL   Near or Above                    Optimal  130-159  mg/dL   Borderline  839-810  mg/dL   High  >809     mg/dL   Very High   TRIG 829 (H) 07/20/2009   CHOLHDL 4.0 07/20/2009   Lab Results  Component Value Date   HGBA1C 6.9 (H) 03/30/2023   No results found for: [id number] Lab Results  Component Value Date   TSH 1.170 04/26/2021    Lauraine Born, AGNP-C, DNP 02/06/2024, 9:28 PM [facility] [address] [place], [place] [address] [phone]

## 2024-02-07 ENCOUNTER — Ambulatory Visit: Payer: Medicare Other | Admitting: Neurology

## 2024-02-07 ENCOUNTER — Encounter: Payer: Self-pay | Admitting: Neurology

## 2024-02-14 ENCOUNTER — Other Ambulatory Visit: Payer: Self-pay | Admitting: Neurology

## 2024-02-19 ENCOUNTER — Encounter: Payer: Self-pay | Admitting: Neurology

## 2024-02-19 MED ORDER — CLONAZEPAM 0.5 MG PO TABS: ORAL_TABLET | ORAL | 0 refills | Status: DC

## 2024-02-19 NOTE — Telephone Encounter (Signed)
 Requested Prescriptions   Pending Prescriptions Disp Refills   clonazePAM  (KLONOPIN ) 0.5 MG tablet 45 tablet 0    Sig: Take 1.5 tablets (0.75 mg total) by mouth at bedtime. Take 1.5 tablets by mouth for sleep REM BD   Last seen 02/07/23 next appt 04/09/24  Dispenses   Dispensed Days Supply Quantity Provider Pharmacy  CLONAZEPAM  0.5 MG TABLET 01/16/2024 30 45 each Dohmeier, Dedra, MD [facility] [phone] - G...  CLONAZEPAM  0.5 MG TABLET 12/15/2023 30 45 each Dohmeier, Dedra, MD [facility] [phone] - G...  CLONAZEPAM  0.5 MG TABLET 11/15/2023 30 45 each Dohmeier, Dedra, MD [facility] [phone] - G...  CLONAZEPAM  0.5 MG TABLET 10/18/2023 30 45 each Dohmeier, Dedra, MD [facility] [phone] - G...  CLONAZEPAM  0.5 MG TABLET 09/17/2023 30 45 each Dohmeier, Dedra, MD [facility] [phone] - G...  CLONAZEPAM  0.5 MG TABLET 08/18/2023 30 45 each Dohmeier, Dedra, MD [facility] [phone] - G...  CLONAZEPAM  0.5 MG TABLET 07/05/2023 30 60 each Dohmeier, Dedra, MD [facility] [phone] - G...  CLONAZEPAM  0.5 MG TABLET 05/25/2023 30 60 each Dohmeier, Dedra, MD [facility] [phone] - G...  CLONAZEPAM  0.5 MG TABLET 04/15/2023 30 60 each Dohmeier, Dedra, MD [facility] [phone] - G...  CLONAZEPAM  0.5 MG TABLET 02/28/2023 30 60 each Dohmeier, Dedra, MD [facility] [phone] - G.SABRASABRA

## 2024-04-08 NOTE — Progress Notes (Unsigned)
 Patient: [name] Date of Birth: 22-Mar-1956  Reason for Visit: Follow up History from: Patient Primary Neurologist: Dohmeier   ASSESSMENT AND PLAN 68 y.o. year old female   REM sleep behavior disorder  Severe OSA on CPAP ([place], setup around 2022, HST from outside office 2022 showed AHI 51.5)  - Doing very well.  Has excellent CPAP compliance.  Continue current settings.  AHI is well treated at 3.2.  She has excellent subjective benefit.  Continue clonazepam  0.75 mg at bedtime. Refilled today. Her husband has passed since last visit. Her granddaughter and great granddaughter are living with her.  We will follow-up in 1 year.  Meds ordered this encounter  Medications   clonazePAM  (KLONOPIN ) 0.5 MG tablet    Sig: Take 1.5 tablets (0.75 mg total) by mouth at bedtime. Take 1.5 tablets by mouth for sleep REM BD    Dispense:  45 tablet    Refill:  5   HISTORY OF PRESENT ILLNESS: Today 04/09/24 04/09/24 SS: CPAP revisit, 93% compliance, >4 hours 86%, 5-10 cm water, AHI 3.2. uses FFM. Intermittent spells of dream acting, will sit up, wake herself up before she actually gets up. Remains on Klonopin  0.75 mg at bedtime. When she has dreams, talks in herself, will wake herself up. Her husband passed in January. Her 45 year old granddaughter and 2 year granddaughter live with her. ONO was not done, doesn't feel needed. Feels well rested, generally sleeps well. No symptoms of PD reported.  HISTORY  [name] is a 68 y.o. female patient who is here for revisit 02/07/2023 for a Rv .  I had the pleasure of meeting [name] for the first time on February 12 of this year when she presented with a longstanding history of sleep behavior disorder treated under clonazepam .  This is still her medication at this time she is also on a Ventolin  inhaler she takes occasionally Tylenol  Restasis eyedrops are still on her list diltiazem , other nova, Pepcid , Flonase, Advair as needed, Lasix  40 mg  daily, Robaxin  as needed, metoprolol  twice a day 25 mg, Singulair , Protonix , Altace , Crestor , Florastor.  Klonopin  is 0.5 mg 2 tablets by mouth at bedtime    She also brought her CPAP to us  today and her compliance with CPAP is excellent she has used the machine 27 out of 30 days and 26 of these days over 4 hours consecutively.  The average use of time is 7 hours 22 minutes.  Her maximum pressure is 10 minimum pressure of 5 cm water with 2 cm EPR.  She does not have high air leakage the 95th percentile pressure is 9.9 cm water and the average AHI is at residual 3.7/h.  I would like to increase the maximum pressure by 2 cm to reduce the residual apnea index for obstructive events.     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    After 3 months he returned home and she noted she had no REM BD events in the 3 months of his absence. He is changing to home dialysis.   REVIEW OF SYSTEMS: Out of a complete 14 system review of symptoms, the patient complains only of the following symptoms, and all other reviewed systems are negative.  See HPI  ALLERGIES: Allergies  Allergen Reactions   Aspirin Anaphylaxis   Bee Venom Anaphylaxis   Demerol [Meperidine] Anaphylaxis  Ibuprofen Anaphylaxis   Nsaids Anaphylaxis   Iohexol  Other (See Comments) and Hypertension    ELEVATION OF B.P, PASSED OUT, PT. NEEDS PRE-MEDS FOR IODINE CONTRAST    Amitriptyline      Mental status changes   Clindamycin/Lincomycin     Thrush    Codeine Nausea And Vomiting   Ivp Dye [Iodinated Contrast Media] Hypertension    Flushing, also   Mupirocin Other (See Comments)    Makes more inflamed   Neosporin [Neomycin-Bacitracin Zn-Polymyx] Other (See Comments)    Makes more inflammed   Pollen Extract Other (See Comments)    Stuffy nose   Sulfonamide Derivatives Hives   Tramadol Nausea And Vomiting   Cephalosporins Swelling   Nystatin Hives     Facial swelling   Penicillins Swelling    Has patient had a PCN reaction causing immediate rash, facial/tongue/throat swelling, SOB or lightheadedness with hypotension: Yes Has patient had a PCN reaction causing severe rash involving mucus membranes or skin necrosis: No Has patient had a PCN reaction that required hospitalization: No Has patient had a PCN reaction occurring within the last 10 years: No If all of the above answers are NO, then may proceed with Cephalosporin use.     HOME MEDICATIONS: Outpatient Medications Prior to Visit  Medication Sig Dispense Refill   acetaminophen  (TYLENOL ) 500 MG tablet Take 1,000 mg by mouth daily as needed (for pain).     albuterol  (VENTOLIN  HFA) 108 (90 Base) MCG/ACT inhaler Inhale 2 puffs into the lungs every 6 (six) hours as needed for wheezing or shortness of breath.      diltiazem  (DILACOR XR ) 180 MG 24 hr capsule Take 180 mg by mouth daily.     EPINEPHrine 0.3 mg/0.3 mL IJ SOAJ injection Inject 0.3 mLs as directed as needed (for an anaphylactic reaction).      Eyelid Cleansers (AVENOVA) 0.01 % SOLN Place 1 application into the right eye daily.      famotidine  (PEPCID ) 40 MG tablet Take 40 mg by mouth daily.     fluticasone (FLONASE) 50 MCG/ACT nasal spray Place 1 spray into both nostrils daily as needed for allergies or rhinitis.      furosemide  (LASIX ) 40 MG tablet Take 40 mg by mouth daily.      metoprolol  tartrate (LOPRESSOR ) 25 MG tablet TAKE 1 TABLET BY MOUTH TWICE A DAY 180 tablet 3   montelukast  (SINGULAIR ) 10 MG tablet Take 10 mg by mouth at bedtime.      Multiple Vitamin (MULTIVITAMIN WITH MINERALS) TABS tablet Take 1 tablet by mouth daily.     pantoprazole  (PROTONIX ) 40 MG tablet Take 40 mg by mouth daily.     ramipril  (ALTACE ) 5 MG capsule Take 1 capsule (5 mg total) by mouth daily. 90 capsule 3   rosuvastatin  (CRESTOR ) 20 MG tablet Take 10 mg by mouth daily.     saccharomyces boulardii (FLORASTOR) 250 MG capsule Take 250 mg by  mouth daily.     WIXELA INHUB 250-50 MCG/ACT AEPB Inhale 1 puff into the lungs daily as needed (for shortness of breath).     XIIDRA 5 % SOLN Apply 1 drop to eye 2 (two) times daily.     clonazePAM  (KLONOPIN ) 0.5 MG tablet Take 1.5 tablets (0.75 mg total) by mouth at bedtime. Take 1.5 tablets by mouth for sleep REM BD 45 tablet 0   methocarbamol  (ROBAXIN ) 750 MG tablet Take 1 tablet (750 mg total) by mouth 3 (three) times daily as needed (muscle spasm/pain). (Patient not  taking: Reported on 04/09/2024) 15 tablet 0   senna-docusate (SENOKOT-S) 8.6-50 MG tablet Take 2 tablets by mouth 2 (two) times daily between meals as needed for mild constipation. (Patient not taking: Reported on 04/09/2024)     No facility-administered medications prior to visit.    PAST MEDICAL HISTORY: Past Medical History:  Diagnosis Date   Atrial fibrillation (HCC)    Complication of anesthesia     I have to be awake to be intubated because I have a mass in my throut    Diabetes mellitus    Difficult intubation    mass in throut   Diverticulitis    Hyperlipidemia    Hypertension    Migraines 2013   Sigmoid diverticulitis 03/20/2016   Sleep apnea    CPAP    PAST SURGICAL HISTORY: Past Surgical History:  Procedure Laterality Date   ABDOMINAL HYSTERECTOMY     BIOPSY  05/17/2022   Procedure: BIOPSY;  Surgeon: Saintclair Jasper, MD;  Location: [facility];  Service: Gastroenterology;;   BREAST CYST ASPIRATION Left 09/2016   COLONOSCOPY WITH PROPOFOL  N/A 05/17/2022   Procedure: COLONOSCOPY WITH PROPOFOL ;  Surgeon: Saintclair Jasper, MD;  Location: [facility];  Service: Gastroenterology;  Laterality: N/A;   COLONOSCOPY WITH PROPOFOL  N/A 08/15/2023   Procedure: COLONOSCOPY WITH PROPOFOL ;  Surgeon: Saintclair Jasper, MD;  Location: [facility];  Service: Gastroenterology;  Laterality: N/A;   GALLBLADDER SURGERY     KNEE ARTHROSCOPY     lymph nodectomy     NASAL SINUS SURGERY     POLYPECTOMY  05/17/2022   Procedure:  POLYPECTOMY;  Surgeon: Saintclair Jasper, MD;  Location: [facility];  Service: Gastroenterology;;   POLYPECTOMY  08/15/2023   Procedure: POLYPECTOMY;  Surgeon: Saintclair Jasper, MD;  Location: [facility];  Service: Gastroenterology;;   SPLENIC ARTERY EMBOLIZATION     due to Aneurysm   TONSILLECTOMY      FAMILY HISTORY: Family History  Problem Relation Age of Onset   Diabetes Father    Hypertension Father    CAD Daughter 73       CABG X 3    SOCIAL HISTORY: Social History   Socioeconomic History   Marital status: Widowed    Spouse name: Not on file   Number of children: Not on file   Years of education: Not on file   Highest education level: Not on file  Occupational History   Not on file  Tobacco Use   Smoking status: Never   Smokeless tobacco: Never  Vaping Use   Vaping status: Never Used  Substance and Sexual Activity   Alcohol  use: No   Drug use: No   Sexual activity: Yes    Birth control/protection: Surgical    Comment: hysterectomy  Other Topics Concern   Not on file  Social History Narrative   Lives with granddaughter.    Social Drivers of Corporate Investment Banker Strain: Not on file  Food Insecurity: No Food Insecurity (03/29/2023)   Hunger Vital Sign    Worried About Running Out of Food in the Last Year: Never true    Ran Out of Food in the Last Year: Never true  Transportation Needs: No Transportation Needs (03/29/2023)   PRAPARE - Administrator, Civil Service (Medical): No    Lack of Transportation (Non-Medical): No  Physical Activity: Not on file  Stress: Not on file  Social Connections: Unknown (10/24/2021)   Received from [facility]   Social Network    Social Network:  Not on file  Intimate Partner Violence: Not At Risk (03/29/2023)   Humiliation, Afraid, Rape, and Kick questionnaire    Fear of Current or Ex-Partner: No    Emotionally Abused: No    Physically Abused: No    Sexually Abused: No   PHYSICAL EXAM  Vitals:    04/09/24 0938  BP: 139/73  Pulse: 65  Weight: 200 lb 9.6 oz (91 kg)  Height: 5' 8.5 (1.74 m)   Body mass index is 30.06 kg/m.  Generalized: Well developed, in no acute distress  Neurological examination  Mentation: Alert oriented to time, place, history taking. Follows all commands speech and language fluent Cranial nerve II-XII: Pupils were equal round reactive to light. Extraocular movements were full, visual field were full on confrontational test. Facial sensation and strength were normal.  Head turning and shoulder shrug  were normal and symmetric. Motor: The motor testing reveals 5 over 5 strength of all 4 extremities. Good symmetric motor tone is noted throughout.  Gait and station: Gait is normal. .   DIAGNOSTIC DATA (LABS, IMAGING, TESTING) - I reviewed patient records, labs, notes, testing and imaging myself where available.  Lab Results  Component Value Date   WBC 6.3 03/30/2023   HGB 14.1 03/30/2023   HCT 42.7 03/30/2023   MCV 92.2 03/30/2023   PLT 314 03/30/2023      Component Value Date/Time   NA 136 03/30/2023 0421   NA 142 11/22/2018 1232   K 4.0 03/30/2023 0421   CL 99 03/30/2023 0421   CO2 25 03/30/2023 0421   GLUCOSE 184 (H) 03/30/2023 0421   BUN 17 03/30/2023 0421   BUN 10 11/22/2018 1232   CREATININE 1.00 03/30/2023 0421   CALCIUM  9.2 03/30/2023 0421   PROT 7.1 03/30/2023 0421   ALBUMIN 3.6 03/30/2023 0421   AST 19 03/30/2023 0421   ALT 19 03/30/2023 0421   ALKPHOS 61 03/30/2023 0421   BILITOT 0.6 03/30/2023 0421   GFRNONAA >60 03/30/2023 0421   GFRAA 56 (L) 11/22/2018 1232   Lab Results  Component Value Date   CHOL  07/20/2009    170        ATP III CLASSIFICATION:  <200     mg/dL   Desirable  799-760  mg/dL   Borderline High  >=759    mg/dL   High          HDL 43 07/20/2009   LDLCALC  07/20/2009    93        Total Cholesterol/HDL:CHD Risk Coronary Heart Disease Risk Table                     Men   Women  1/2 Average Risk   3.4    3.3  Average Risk       5.0   4.4  2 X Average Risk   9.6   7.1  3 X Average Risk  23.4   11.0        Use the calculated Patient Ratio above and the CHD Risk Table to determine the patient's CHD Risk.        ATP III CLASSIFICATION (LDL):  <100     mg/dL   Optimal  899-870  mg/dL   Near or Above                    Optimal  130-159  mg/dL   Borderline  839-810  mg/dL   High  >809  mg/dL   Very High   TRIG 829 (H) 07/20/2009   CHOLHDL 4.0 07/20/2009   Lab Results  Component Value Date   HGBA1C 6.9 (H) 03/30/2023   No results found for: [id number] Lab Results  Component Value Date   TSH 1.170 04/26/2021    Lauraine Born, AGNP-C, DNP 04/09/2024, 10:08 AM [facility] [address] [place], [place] [address] [phone]

## 2024-04-09 ENCOUNTER — Encounter: Payer: Self-pay | Admitting: Neurology

## 2024-04-09 ENCOUNTER — Ambulatory Visit (INDEPENDENT_AMBULATORY_CARE_PROVIDER_SITE_OTHER): Admitting: Neurology

## 2024-04-09 VITALS — BP 139/73 | HR 65 | Ht 68.5 in | Wt 200.6 lb

## 2024-04-09 DIAGNOSIS — G4733 Obstructive sleep apnea (adult) (pediatric): Secondary | ICD-10-CM | POA: Diagnosis not present

## 2024-04-09 DIAGNOSIS — G4752 REM sleep behavior disorder: Secondary | ICD-10-CM

## 2024-04-09 MED ORDER — CLONAZEPAM 0.5 MG PO TABS: ORAL_TABLET | ORAL | 5 refills | Status: AC

## 2024-04-09 NOTE — Patient Instructions (Signed)
 Great to meet you today! Continue Klonopin , refilled Continue CPAP nightly minimum 4 hours.  Continue current settings Call for any issues, follow-up 1 year.  Thanks :)

## 2024-05-02 ENCOUNTER — Encounter: Payer: Self-pay | Admitting: Neurology

## 2024-05-02 NOTE — Telephone Encounter (Signed)
 Last visit:04/09/2024  Next visit: 04/10/2025  Last fills:     Spoke with Dr Chalice who gave verbal ok to refill a 30 day supply of Clonazepam  early.   I called [facility] and spoke with Natalie. Gave verbal ok from Dr Chalice for early refill of Clonazepam . Laneta said she would approve it.

## 2025-04-10 ENCOUNTER — Ambulatory Visit: Admitting: Neurology
